# Patient Record
Sex: Male | Born: 1935 | Race: White | Hispanic: No | Smoking: Former smoker
Health system: Southern US, Community
[De-identification: ages and names within clinical notes are randomized; demographics above are authoritative.]

## PROBLEM LIST (undated history)

## (undated) DIAGNOSIS — Z950 Presence of cardiac pacemaker: Secondary | ICD-10-CM

## (undated) DIAGNOSIS — L905 Scar conditions and fibrosis of skin: Secondary | ICD-10-CM

## (undated) DIAGNOSIS — M109 Gout, unspecified: Secondary | ICD-10-CM

## (undated) DIAGNOSIS — I2510 Atherosclerotic heart disease of native coronary artery without angina pectoris: Secondary | ICD-10-CM

## (undated) DIAGNOSIS — I519 Heart disease, unspecified: Secondary | ICD-10-CM

## (undated) DIAGNOSIS — I679 Cerebrovascular disease, unspecified: Secondary | ICD-10-CM

## (undated) DIAGNOSIS — Z7901 Long term (current) use of anticoagulants: Secondary | ICD-10-CM

## (undated) DIAGNOSIS — I10 Essential (primary) hypertension: Secondary | ICD-10-CM

## (undated) DIAGNOSIS — R42 Dizziness and giddiness: Secondary | ICD-10-CM

## (undated) DIAGNOSIS — I5189 Other ill-defined heart diseases: Secondary | ICD-10-CM

## (undated) DIAGNOSIS — Z87891 Personal history of nicotine dependence: Secondary | ICD-10-CM

## (undated) DIAGNOSIS — I495 Sick sinus syndrome: Secondary | ICD-10-CM

## (undated) DIAGNOSIS — E785 Hyperlipidemia, unspecified: Secondary | ICD-10-CM

## (undated) DIAGNOSIS — M1990 Unspecified osteoarthritis, unspecified site: Secondary | ICD-10-CM

## (undated) HISTORY — DX: Scar conditions and fibrosis of skin: L90.5

## (undated) HISTORY — DX: Other ill-defined heart diseases: I51.89

## (undated) HISTORY — DX: Cerebrovascular disease, unspecified: I67.9

## (undated) HISTORY — PX: TRANSURETHRAL RESECTION OF PROSTATE: SHX73

## (undated) HISTORY — DX: Presence of cardiac pacemaker: Z95.0

## (undated) HISTORY — DX: Long term (current) use of anticoagulants: Z79.01

## (undated) HISTORY — DX: Hyperlipidemia, unspecified: E78.5

## (undated) HISTORY — DX: Essential (primary) hypertension: I10

## (undated) HISTORY — PX: KNEE SURGERY: SHX244

## (undated) HISTORY — DX: Gout, unspecified: M10.9

## (undated) HISTORY — DX: Dizziness and giddiness: R42

## (undated) HISTORY — PX: BLADDER SURGERY: SHX569

## (undated) HISTORY — DX: Atherosclerotic heart disease of native coronary artery without angina pectoris: I25.10

## (undated) HISTORY — PX: BACK SURGERY: SHX140

## (undated) HISTORY — DX: Unspecified osteoarthritis, unspecified site: M19.90

## (undated) HISTORY — PX: NECK SURGERY: SHX720

## (undated) HISTORY — DX: Sick sinus syndrome: I49.5

## (undated) HISTORY — DX: Heart disease, unspecified: I51.9

## (undated) HISTORY — PX: CHOLECYSTECTOMY: SHX55

## (undated) HISTORY — DX: Personal history of nicotine dependence: Z87.891

## (undated) HISTORY — PX: APPENDECTOMY: SHX54

---

## 1981-01-12 HISTORY — PX: CORONARY ARTERY BYPASS GRAFT: SHX141

## 2000-08-29 ENCOUNTER — Encounter: Payer: Self-pay | Admitting: Neurosurgery

## 2000-08-29 ENCOUNTER — Ambulatory Visit (HOSPITAL_COMMUNITY): Admission: RE | Admit: 2000-08-29 | Discharge: 2000-08-29 | Payer: Self-pay | Admitting: Neurosurgery

## 2000-09-21 ENCOUNTER — Encounter: Admission: RE | Admit: 2000-09-21 | Discharge: 2000-09-21 | Payer: Self-pay | Admitting: Neurosurgery

## 2000-09-21 ENCOUNTER — Encounter: Payer: Self-pay | Admitting: Neurosurgery

## 2000-10-05 ENCOUNTER — Encounter: Admission: RE | Admit: 2000-10-05 | Discharge: 2000-10-05 | Payer: Self-pay | Admitting: Neurosurgery

## 2000-10-05 ENCOUNTER — Encounter: Payer: Self-pay | Admitting: Neurosurgery

## 2000-11-26 ENCOUNTER — Encounter: Payer: Self-pay | Admitting: Neurosurgery

## 2000-11-30 ENCOUNTER — Encounter: Payer: Self-pay | Admitting: Neurosurgery

## 2000-11-30 ENCOUNTER — Inpatient Hospital Stay (HOSPITAL_COMMUNITY): Admission: RE | Admit: 2000-11-30 | Discharge: 2000-12-02 | Payer: Self-pay | Admitting: Neurosurgery

## 2001-10-15 ENCOUNTER — Ambulatory Visit (HOSPITAL_COMMUNITY): Admission: RE | Admit: 2001-10-15 | Discharge: 2001-10-15 | Payer: Self-pay | Admitting: Neurosurgery

## 2001-10-15 ENCOUNTER — Encounter: Payer: Self-pay | Admitting: Neurosurgery

## 2001-10-25 ENCOUNTER — Encounter: Payer: Self-pay | Admitting: Neurosurgery

## 2001-10-25 ENCOUNTER — Encounter: Admission: RE | Admit: 2001-10-25 | Discharge: 2001-10-25 | Payer: Self-pay | Admitting: Neurosurgery

## 2001-11-16 ENCOUNTER — Encounter: Payer: Self-pay | Admitting: Neurosurgery

## 2001-11-18 ENCOUNTER — Inpatient Hospital Stay (HOSPITAL_COMMUNITY): Admission: RE | Admit: 2001-11-18 | Discharge: 2001-11-20 | Payer: Self-pay | Admitting: Neurosurgery

## 2001-11-18 ENCOUNTER — Encounter: Payer: Self-pay | Admitting: Neurosurgery

## 2002-03-24 ENCOUNTER — Encounter: Payer: Self-pay | Admitting: Emergency Medicine

## 2002-03-24 ENCOUNTER — Emergency Department (HOSPITAL_COMMUNITY): Admission: EM | Admit: 2002-03-24 | Discharge: 2002-03-24 | Payer: Self-pay | Admitting: Emergency Medicine

## 2002-03-25 ENCOUNTER — Inpatient Hospital Stay (HOSPITAL_COMMUNITY): Admission: EM | Admit: 2002-03-25 | Discharge: 2002-03-28 | Payer: Self-pay | Admitting: Emergency Medicine

## 2002-04-10 ENCOUNTER — Other Ambulatory Visit: Admission: RE | Admit: 2002-04-10 | Discharge: 2002-04-10 | Payer: Self-pay | Admitting: Dermatology

## 2002-10-01 ENCOUNTER — Encounter: Payer: Self-pay | Admitting: Emergency Medicine

## 2002-10-01 ENCOUNTER — Inpatient Hospital Stay (HOSPITAL_COMMUNITY): Admission: EM | Admit: 2002-10-01 | Discharge: 2002-10-03 | Payer: Self-pay | Admitting: Cardiology

## 2002-10-01 ENCOUNTER — Emergency Department (HOSPITAL_COMMUNITY): Admission: EM | Admit: 2002-10-01 | Discharge: 2002-10-01 | Payer: Self-pay | Admitting: Emergency Medicine

## 2003-02-07 ENCOUNTER — Ambulatory Visit (HOSPITAL_COMMUNITY): Admission: RE | Admit: 2003-02-07 | Discharge: 2003-02-07 | Payer: Self-pay | Admitting: Cardiology

## 2003-05-17 ENCOUNTER — Other Ambulatory Visit: Admission: RE | Admit: 2003-05-17 | Discharge: 2003-05-17 | Payer: Self-pay | Admitting: Dermatology

## 2003-09-06 ENCOUNTER — Other Ambulatory Visit: Admission: RE | Admit: 2003-09-06 | Discharge: 2003-09-06 | Payer: Self-pay | Admitting: Dermatology

## 2004-01-13 DIAGNOSIS — I679 Cerebrovascular disease, unspecified: Secondary | ICD-10-CM

## 2004-01-13 HISTORY — PX: ANTERIOR FUSION CERVICAL SPINE: SUR626

## 2004-01-13 HISTORY — DX: Cerebrovascular disease, unspecified: I67.9

## 2004-02-13 ENCOUNTER — Ambulatory Visit: Payer: Self-pay | Admitting: Cardiology

## 2004-05-22 ENCOUNTER — Ambulatory Visit (HOSPITAL_COMMUNITY): Admission: RE | Admit: 2004-05-22 | Discharge: 2004-05-22 | Payer: Self-pay | Admitting: Neurosurgery

## 2004-07-03 ENCOUNTER — Ambulatory Visit (HOSPITAL_COMMUNITY): Admission: RE | Admit: 2004-07-03 | Discharge: 2004-07-03 | Payer: Self-pay | Admitting: Neurosurgery

## 2004-07-09 ENCOUNTER — Ambulatory Visit: Payer: Self-pay | Admitting: *Deleted

## 2004-07-10 ENCOUNTER — Ambulatory Visit (HOSPITAL_COMMUNITY): Admission: RE | Admit: 2004-07-10 | Discharge: 2004-07-10 | Payer: Self-pay | Admitting: *Deleted

## 2004-07-10 ENCOUNTER — Ambulatory Visit: Payer: Self-pay | Admitting: Internal Medicine

## 2004-07-11 ENCOUNTER — Ambulatory Visit (HOSPITAL_COMMUNITY): Admission: RE | Admit: 2004-07-11 | Discharge: 2004-07-11 | Payer: Self-pay | Admitting: *Deleted

## 2004-07-11 ENCOUNTER — Ambulatory Visit: Payer: Self-pay | Admitting: Cardiovascular Disease

## 2004-07-21 ENCOUNTER — Ambulatory Visit: Payer: Self-pay | Admitting: Cardiology

## 2004-08-08 ENCOUNTER — Inpatient Hospital Stay (HOSPITAL_COMMUNITY): Admission: RE | Admit: 2004-08-08 | Discharge: 2004-08-09 | Payer: Self-pay | Admitting: Neurosurgery

## 2004-08-12 ENCOUNTER — Ambulatory Visit: Payer: Self-pay | Admitting: Internal Medicine

## 2004-08-12 ENCOUNTER — Ambulatory Visit (HOSPITAL_COMMUNITY): Admission: RE | Admit: 2004-08-12 | Discharge: 2004-08-13 | Payer: Self-pay | Admitting: Internal Medicine

## 2004-08-12 DIAGNOSIS — Z950 Presence of cardiac pacemaker: Secondary | ICD-10-CM

## 2004-08-12 DIAGNOSIS — I495 Sick sinus syndrome: Secondary | ICD-10-CM

## 2004-08-12 HISTORY — DX: Presence of cardiac pacemaker: Z95.0

## 2004-08-12 HISTORY — PX: PACEMAKER PLACEMENT: SHX43

## 2004-08-12 HISTORY — DX: Sick sinus syndrome: I49.5

## 2004-08-25 ENCOUNTER — Ambulatory Visit: Payer: Self-pay

## 2004-09-02 ENCOUNTER — Inpatient Hospital Stay (HOSPITAL_COMMUNITY): Admission: RE | Admit: 2004-09-02 | Discharge: 2004-09-08 | Payer: Self-pay | Admitting: Neurosurgery

## 2004-09-19 ENCOUNTER — Emergency Department (HOSPITAL_COMMUNITY): Admission: EM | Admit: 2004-09-19 | Discharge: 2004-09-19 | Payer: Self-pay | Admitting: Emergency Medicine

## 2004-09-25 ENCOUNTER — Ambulatory Visit: Payer: Self-pay | Admitting: Cardiology

## 2004-10-01 ENCOUNTER — Ambulatory Visit: Payer: Self-pay | Admitting: *Deleted

## 2004-10-06 ENCOUNTER — Ambulatory Visit: Payer: Self-pay | Admitting: Cardiology

## 2004-10-14 ENCOUNTER — Ambulatory Visit: Payer: Self-pay | Admitting: *Deleted

## 2004-10-29 ENCOUNTER — Ambulatory Visit: Payer: Self-pay | Admitting: *Deleted

## 2004-11-19 ENCOUNTER — Ambulatory Visit: Payer: Self-pay | Admitting: *Deleted

## 2004-11-26 ENCOUNTER — Ambulatory Visit: Payer: Self-pay | Admitting: Cardiology

## 2005-02-04 ENCOUNTER — Ambulatory Visit: Payer: Self-pay | Admitting: Internal Medicine

## 2005-02-20 ENCOUNTER — Ambulatory Visit: Payer: Self-pay | Admitting: Cardiology

## 2005-03-13 ENCOUNTER — Ambulatory Visit: Payer: Self-pay | Admitting: *Deleted

## 2005-04-23 ENCOUNTER — Ambulatory Visit: Payer: Self-pay | Admitting: Cardiology

## 2005-05-11 ENCOUNTER — Ambulatory Visit: Payer: Self-pay | Admitting: Cardiology

## 2005-06-12 ENCOUNTER — Ambulatory Visit: Payer: Self-pay | Admitting: *Deleted

## 2005-08-24 ENCOUNTER — Emergency Department (HOSPITAL_COMMUNITY): Admission: EM | Admit: 2005-08-24 | Discharge: 2005-08-24 | Payer: Self-pay | Admitting: Emergency Medicine

## 2005-09-01 ENCOUNTER — Ambulatory Visit: Payer: Self-pay | Admitting: Internal Medicine

## 2005-10-16 ENCOUNTER — Inpatient Hospital Stay (HOSPITAL_COMMUNITY): Admission: AD | Admit: 2005-10-16 | Discharge: 2005-10-21 | Payer: Self-pay | Admitting: Family Medicine

## 2005-11-18 ENCOUNTER — Ambulatory Visit (HOSPITAL_COMMUNITY): Admission: RE | Admit: 2005-11-18 | Discharge: 2005-11-18 | Payer: Self-pay | Admitting: Neurosurgery

## 2005-12-10 ENCOUNTER — Ambulatory Visit: Payer: Self-pay | Admitting: Cardiology

## 2005-12-17 ENCOUNTER — Ambulatory Visit: Payer: Self-pay | Admitting: Cardiology

## 2005-12-22 ENCOUNTER — Inpatient Hospital Stay (HOSPITAL_COMMUNITY): Admission: RE | Admit: 2005-12-22 | Discharge: 2005-12-26 | Payer: Self-pay | Admitting: Neurosurgery

## 2006-01-18 ENCOUNTER — Ambulatory Visit: Payer: Self-pay | Admitting: Cardiology

## 2006-01-25 ENCOUNTER — Ambulatory Visit: Payer: Self-pay | Admitting: Internal Medicine

## 2006-02-09 ENCOUNTER — Ambulatory Visit: Payer: Self-pay | Admitting: Cardiology

## 2006-02-23 ENCOUNTER — Ambulatory Visit: Payer: Self-pay | Admitting: Internal Medicine

## 2006-03-02 ENCOUNTER — Ambulatory Visit: Payer: Self-pay | Admitting: Cardiology

## 2006-03-30 ENCOUNTER — Ambulatory Visit: Payer: Self-pay | Admitting: Cardiology

## 2006-04-16 ENCOUNTER — Ambulatory Visit: Payer: Self-pay | Admitting: Cardiology

## 2006-04-30 ENCOUNTER — Ambulatory Visit: Payer: Self-pay | Admitting: Cardiology

## 2006-05-13 ENCOUNTER — Ambulatory Visit: Payer: Self-pay | Admitting: Cardiology

## 2006-06-14 ENCOUNTER — Ambulatory Visit: Payer: Self-pay | Admitting: Cardiology

## 2006-06-29 ENCOUNTER — Emergency Department (HOSPITAL_COMMUNITY): Admission: EM | Admit: 2006-06-29 | Discharge: 2006-06-29 | Payer: Self-pay | Admitting: Emergency Medicine

## 2006-07-19 ENCOUNTER — Ambulatory Visit: Payer: Self-pay | Admitting: Internal Medicine

## 2006-08-02 ENCOUNTER — Ambulatory Visit: Payer: Self-pay | Admitting: Cardiology

## 2006-08-19 ENCOUNTER — Ambulatory Visit: Payer: Self-pay | Admitting: Internal Medicine

## 2006-08-30 ENCOUNTER — Ambulatory Visit: Payer: Self-pay | Admitting: Cardiology

## 2006-10-01 ENCOUNTER — Ambulatory Visit: Payer: Self-pay | Admitting: Cardiology

## 2006-11-01 ENCOUNTER — Ambulatory Visit: Payer: Self-pay | Admitting: Cardiology

## 2006-11-15 ENCOUNTER — Ambulatory Visit: Payer: Self-pay | Admitting: Cardiology

## 2006-12-06 ENCOUNTER — Ambulatory Visit: Payer: Self-pay | Admitting: Internal Medicine

## 2006-12-29 ENCOUNTER — Ambulatory Visit: Payer: Self-pay | Admitting: Cardiology

## 2007-01-12 ENCOUNTER — Ambulatory Visit: Payer: Self-pay | Admitting: Cardiology

## 2007-02-09 ENCOUNTER — Ambulatory Visit: Payer: Self-pay | Admitting: Cardiology

## 2007-03-24 ENCOUNTER — Ambulatory Visit: Payer: Self-pay | Admitting: Cardiology

## 2007-04-14 ENCOUNTER — Ambulatory Visit: Payer: Self-pay | Admitting: Cardiovascular Disease

## 2007-04-21 ENCOUNTER — Ambulatory Visit: Payer: Self-pay | Admitting: Cardiology

## 2007-05-19 ENCOUNTER — Ambulatory Visit: Payer: Self-pay | Admitting: Cardiology

## 2007-06-07 ENCOUNTER — Ambulatory Visit: Payer: Self-pay | Admitting: Internal Medicine

## 2007-06-16 ENCOUNTER — Ambulatory Visit: Payer: Self-pay | Admitting: Cardiology

## 2007-06-20 ENCOUNTER — Emergency Department (HOSPITAL_COMMUNITY): Admission: EM | Admit: 2007-06-20 | Discharge: 2007-06-20 | Payer: Self-pay | Admitting: Emergency Medicine

## 2007-07-14 ENCOUNTER — Ambulatory Visit: Payer: Self-pay | Admitting: Cardiology

## 2007-07-28 ENCOUNTER — Ambulatory Visit: Payer: Self-pay | Admitting: Cardiology

## 2007-08-08 ENCOUNTER — Ambulatory Visit (HOSPITAL_COMMUNITY): Admission: RE | Admit: 2007-08-08 | Discharge: 2007-08-08 | Payer: Self-pay | Admitting: Internal Medicine

## 2007-08-19 ENCOUNTER — Ambulatory Visit: Payer: Self-pay | Admitting: Cardiology

## 2007-09-02 ENCOUNTER — Ambulatory Visit: Payer: Self-pay | Admitting: Internal Medicine

## 2007-09-15 ENCOUNTER — Ambulatory Visit: Payer: Self-pay | Admitting: Cardiology

## 2007-09-29 ENCOUNTER — Ambulatory Visit: Payer: Self-pay | Admitting: Cardiology

## 2007-10-13 ENCOUNTER — Ambulatory Visit (HOSPITAL_COMMUNITY): Admission: RE | Admit: 2007-10-13 | Discharge: 2007-10-13 | Payer: Self-pay | Admitting: Internal Medicine

## 2007-10-21 ENCOUNTER — Ambulatory Visit (HOSPITAL_COMMUNITY): Admission: RE | Admit: 2007-10-21 | Discharge: 2007-10-21 | Payer: Self-pay | Admitting: Urology

## 2007-10-27 ENCOUNTER — Ambulatory Visit: Payer: Self-pay | Admitting: Cardiology

## 2007-11-08 ENCOUNTER — Ambulatory Visit (HOSPITAL_COMMUNITY): Admission: RE | Admit: 2007-11-08 | Discharge: 2007-11-08 | Payer: Self-pay | Admitting: General Surgery

## 2008-02-20 ENCOUNTER — Ambulatory Visit: Payer: Self-pay | Admitting: Cardiology

## 2008-02-23 ENCOUNTER — Encounter: Payer: Self-pay | Admitting: Internal Medicine

## 2008-02-23 ENCOUNTER — Ambulatory Visit: Payer: Self-pay | Admitting: Cardiology

## 2008-03-26 ENCOUNTER — Ambulatory Visit: Payer: Self-pay | Admitting: Cardiology

## 2008-04-09 ENCOUNTER — Ambulatory Visit: Payer: Self-pay | Admitting: Cardiology

## 2008-04-23 ENCOUNTER — Ambulatory Visit: Payer: Self-pay | Admitting: Cardiology

## 2008-05-10 ENCOUNTER — Ambulatory Visit: Payer: Self-pay | Admitting: Cardiology

## 2008-05-24 ENCOUNTER — Ambulatory Visit: Payer: Self-pay | Admitting: Internal Medicine

## 2008-05-28 ENCOUNTER — Encounter: Payer: Self-pay | Admitting: Cardiology

## 2008-05-28 ENCOUNTER — Ambulatory Visit (HOSPITAL_COMMUNITY): Admission: RE | Admit: 2008-05-28 | Discharge: 2008-05-28 | Payer: Self-pay | Admitting: Cardiology

## 2008-05-28 ENCOUNTER — Encounter: Payer: Self-pay | Admitting: Physician Assistant

## 2008-05-28 ENCOUNTER — Ambulatory Visit: Payer: Self-pay | Admitting: Cardiology

## 2008-05-28 ENCOUNTER — Encounter (INDEPENDENT_AMBULATORY_CARE_PROVIDER_SITE_OTHER): Payer: Self-pay | Admitting: *Deleted

## 2008-05-28 LAB — CONVERTED CEMR LAB
ALT: 13 units/L
AST: 25 units/L
AST: 25 units/L
Alkaline Phosphatase: 55 units/L
Alkaline Phosphatase: 55 units/L
BUN: 14 mg/dL
BUN: 14 mg/dL
Calcium: 9.4 mg/dL
Creatinine, Ser: 0.87 mg/dL
Creatinine, Ser: 0.87 mg/dL
Glucose, Bld: 92 mg/dL
HCT: 42.1 %
Hemoglobin: 14.6 g/dL
MCV: 96.9 fL
WBC: 6.1 10*3/uL

## 2008-05-29 ENCOUNTER — Ambulatory Visit (HOSPITAL_COMMUNITY): Admission: RE | Admit: 2008-05-29 | Discharge: 2008-05-29 | Payer: Self-pay | Admitting: Cardiology

## 2008-06-01 ENCOUNTER — Ambulatory Visit: Payer: Self-pay | Admitting: Cardiology

## 2008-06-01 ENCOUNTER — Encounter (HOSPITAL_COMMUNITY): Admission: RE | Admit: 2008-06-01 | Discharge: 2008-07-01 | Payer: Self-pay | Admitting: Cardiology

## 2008-06-05 DIAGNOSIS — M109 Gout, unspecified: Secondary | ICD-10-CM

## 2008-06-05 DIAGNOSIS — R42 Dizziness and giddiness: Secondary | ICD-10-CM

## 2008-06-05 DIAGNOSIS — I10 Essential (primary) hypertension: Secondary | ICD-10-CM | POA: Insufficient documentation

## 2008-06-05 DIAGNOSIS — E785 Hyperlipidemia, unspecified: Secondary | ICD-10-CM

## 2008-06-05 DIAGNOSIS — I2510 Atherosclerotic heart disease of native coronary artery without angina pectoris: Secondary | ICD-10-CM | POA: Insufficient documentation

## 2008-06-07 ENCOUNTER — Ambulatory Visit: Payer: Self-pay | Admitting: Physician Assistant

## 2008-06-07 ENCOUNTER — Encounter: Payer: Self-pay | Admitting: Physician Assistant

## 2008-06-14 ENCOUNTER — Ambulatory Visit: Payer: Self-pay | Admitting: Cardiology

## 2008-06-21 ENCOUNTER — Encounter (INDEPENDENT_AMBULATORY_CARE_PROVIDER_SITE_OTHER): Payer: Self-pay | Admitting: *Deleted

## 2008-07-02 ENCOUNTER — Telehealth: Payer: Self-pay | Admitting: Cardiology

## 2008-07-09 ENCOUNTER — Ambulatory Visit: Payer: Self-pay | Admitting: Cardiology

## 2008-08-06 ENCOUNTER — Ambulatory Visit: Payer: Self-pay | Admitting: Cardiology

## 2008-08-13 ENCOUNTER — Ambulatory Visit: Payer: Self-pay | Admitting: Cardiology

## 2008-08-23 ENCOUNTER — Ambulatory Visit: Payer: Self-pay | Admitting: Internal Medicine

## 2008-08-27 ENCOUNTER — Encounter: Payer: Self-pay | Admitting: *Deleted

## 2008-08-30 ENCOUNTER — Ambulatory Visit: Payer: Self-pay | Admitting: Cardiology

## 2008-09-05 ENCOUNTER — Ambulatory Visit: Payer: Self-pay | Admitting: Internal Medicine

## 2008-09-24 ENCOUNTER — Ambulatory Visit: Payer: Self-pay

## 2008-10-18 ENCOUNTER — Ambulatory Visit: Payer: Self-pay | Admitting: Cardiology

## 2008-10-18 LAB — CONVERTED CEMR LAB: POC INR: 3.6

## 2008-11-21 ENCOUNTER — Ambulatory Visit: Payer: Self-pay | Admitting: Cardiology

## 2008-11-21 ENCOUNTER — Encounter (INDEPENDENT_AMBULATORY_CARE_PROVIDER_SITE_OTHER): Payer: Self-pay | Admitting: *Deleted

## 2008-11-21 DIAGNOSIS — I495 Sick sinus syndrome: Secondary | ICD-10-CM | POA: Insufficient documentation

## 2008-11-21 DIAGNOSIS — F17200 Nicotine dependence, unspecified, uncomplicated: Secondary | ICD-10-CM

## 2008-11-21 DIAGNOSIS — I679 Cerebrovascular disease, unspecified: Secondary | ICD-10-CM | POA: Insufficient documentation

## 2008-11-23 ENCOUNTER — Encounter (INDEPENDENT_AMBULATORY_CARE_PROVIDER_SITE_OTHER): Payer: Self-pay | Admitting: *Deleted

## 2008-11-23 LAB — CONVERTED CEMR LAB
OCCULT 1: NEGATIVE
OCCULT 2: NEGATIVE
OCCULT 3: NEGATIVE

## 2008-11-30 ENCOUNTER — Inpatient Hospital Stay (HOSPITAL_COMMUNITY): Admission: RE | Admit: 2008-11-30 | Discharge: 2008-12-02 | Payer: Self-pay | Admitting: Neurosurgery

## 2008-12-17 ENCOUNTER — Ambulatory Visit: Payer: Self-pay | Admitting: Cardiovascular Disease

## 2009-01-12 LAB — HM COLONOSCOPY: HM Colonoscopy: NORMAL

## 2009-02-13 ENCOUNTER — Encounter (INDEPENDENT_AMBULATORY_CARE_PROVIDER_SITE_OTHER): Payer: Self-pay | Admitting: Cardiology

## 2009-02-15 ENCOUNTER — Encounter (INDEPENDENT_AMBULATORY_CARE_PROVIDER_SITE_OTHER): Payer: Self-pay | Admitting: *Deleted

## 2009-02-25 ENCOUNTER — Ambulatory Visit: Payer: Self-pay | Admitting: Cardiology

## 2009-02-25 ENCOUNTER — Encounter (INDEPENDENT_AMBULATORY_CARE_PROVIDER_SITE_OTHER): Payer: Self-pay | Admitting: Cardiology

## 2009-03-11 ENCOUNTER — Ambulatory Visit: Payer: Self-pay | Admitting: Cardiology

## 2009-03-11 ENCOUNTER — Ambulatory Visit: Payer: Self-pay | Admitting: Internal Medicine

## 2009-03-11 DIAGNOSIS — Z950 Presence of cardiac pacemaker: Secondary | ICD-10-CM | POA: Insufficient documentation

## 2009-03-11 DIAGNOSIS — I4891 Unspecified atrial fibrillation: Secondary | ICD-10-CM | POA: Insufficient documentation

## 2009-03-11 LAB — CONVERTED CEMR LAB: POC INR: 2.2

## 2009-04-01 ENCOUNTER — Encounter (INDEPENDENT_AMBULATORY_CARE_PROVIDER_SITE_OTHER): Payer: Self-pay | Admitting: Cardiology

## 2009-04-01 ENCOUNTER — Ambulatory Visit: Payer: Self-pay | Admitting: Internal Medicine

## 2009-04-01 LAB — CONVERTED CEMR LAB: POC INR: 2.3

## 2009-04-29 ENCOUNTER — Ambulatory Visit: Payer: Self-pay | Admitting: Cardiology

## 2009-04-29 LAB — CONVERTED CEMR LAB: POC INR: 1.9

## 2009-05-20 ENCOUNTER — Ambulatory Visit: Payer: Self-pay | Admitting: Internal Medicine

## 2009-05-20 LAB — CONVERTED CEMR LAB: POC INR: 1.9

## 2009-06-07 ENCOUNTER — Ambulatory Visit: Payer: Self-pay | Admitting: Cardiovascular Disease

## 2009-06-07 LAB — CONVERTED CEMR LAB: POC INR: 2.3

## 2009-07-05 ENCOUNTER — Ambulatory Visit: Payer: Self-pay | Admitting: Cardiology

## 2009-07-11 ENCOUNTER — Telehealth (INDEPENDENT_AMBULATORY_CARE_PROVIDER_SITE_OTHER): Payer: Self-pay

## 2009-08-02 ENCOUNTER — Ambulatory Visit: Payer: Self-pay | Admitting: Cardiology

## 2009-09-02 ENCOUNTER — Ambulatory Visit: Payer: Self-pay | Admitting: Internal Medicine

## 2009-09-03 ENCOUNTER — Ambulatory Visit: Payer: Self-pay | Admitting: Internal Medicine

## 2009-09-30 ENCOUNTER — Ambulatory Visit: Payer: Self-pay | Admitting: Cardiovascular Disease

## 2009-09-30 LAB — CONVERTED CEMR LAB: POC INR: 1.9

## 2009-10-02 ENCOUNTER — Telehealth: Payer: Self-pay | Admitting: Internal Medicine

## 2009-10-28 ENCOUNTER — Ambulatory Visit: Payer: Self-pay | Admitting: Cardiology

## 2009-11-25 ENCOUNTER — Ambulatory Visit: Payer: Self-pay | Admitting: Cardiology

## 2009-11-25 DIAGNOSIS — I2510 Atherosclerotic heart disease of native coronary artery without angina pectoris: Secondary | ICD-10-CM | POA: Insufficient documentation

## 2009-12-10 ENCOUNTER — Ambulatory Visit: Payer: Self-pay | Admitting: Internal Medicine

## 2009-12-10 DIAGNOSIS — N138 Other obstructive and reflux uropathy: Secondary | ICD-10-CM

## 2009-12-10 DIAGNOSIS — M1990 Unspecified osteoarthritis, unspecified site: Secondary | ICD-10-CM | POA: Insufficient documentation

## 2009-12-10 DIAGNOSIS — N403 Nodular prostate with lower urinary tract symptoms: Secondary | ICD-10-CM

## 2009-12-10 LAB — CONVERTED CEMR LAB
ALT: 20 units/L (ref 0–53)
Albumin: 4.6 g/dL (ref 3.5–5.2)
BUN: 26 mg/dL — ABNORMAL HIGH (ref 6–23)
Basophils Relative: 0.3 % (ref 0.0–3.0)
Bilirubin Urine: NEGATIVE
CO2: 29 meq/L (ref 19–32)
Chloride: 99 meq/L (ref 96–112)
Cholesterol, target level: 200 mg/dL
Cholesterol: 200 mg/dL (ref 0–200)
Eosinophils Relative: 1.2 % (ref 0.0–5.0)
HCT: 44.2 % (ref 39.0–52.0)
Ketones, ur: NEGATIVE mg/dL
LDL Cholesterol: 113 mg/dL — ABNORMAL HIGH (ref 0–99)
LDL Goal: 100 mg/dL
Leukocytes, UA: NEGATIVE
Lymphs Abs: 1.2 10*3/uL (ref 0.7–4.0)
MCHC: 34.7 g/dL (ref 30.0–36.0)
MCV: 94.1 fL (ref 78.0–100.0)
Monocytes Absolute: 0.7 10*3/uL (ref 0.1–1.0)
PSA: 1.07 ng/mL (ref 0.10–4.00)
Platelets: 218 10*3/uL (ref 150.0–400.0)
Potassium: 3.5 meq/L (ref 3.5–5.1)
RBC: 4.69 M/uL (ref 4.22–5.81)
TSH: 1.53 microintl units/mL (ref 0.35–5.50)
Total Protein: 7.6 g/dL (ref 6.0–8.3)
Uric Acid, Serum: 9.8 mg/dL — ABNORMAL HIGH (ref 4.0–7.8)
Urobilinogen, UA: 0.2 (ref 0.0–1.0)
WBC: 6.3 10*3/uL (ref 4.5–10.5)

## 2009-12-12 ENCOUNTER — Telehealth (INDEPENDENT_AMBULATORY_CARE_PROVIDER_SITE_OTHER): Payer: Self-pay | Admitting: *Deleted

## 2009-12-17 ENCOUNTER — Ambulatory Visit: Payer: Self-pay

## 2009-12-17 ENCOUNTER — Encounter: Payer: Self-pay | Admitting: Cardiology

## 2009-12-23 ENCOUNTER — Ambulatory Visit: Payer: Self-pay | Admitting: Cardiology

## 2009-12-23 LAB — CONVERTED CEMR LAB: POC INR: 4

## 2009-12-25 ENCOUNTER — Telehealth: Payer: Self-pay | Admitting: Cardiology

## 2009-12-26 ENCOUNTER — Ambulatory Visit: Payer: Self-pay | Admitting: Internal Medicine

## 2009-12-26 DIAGNOSIS — M5030 Other cervical disc degeneration, unspecified cervical region: Secondary | ICD-10-CM

## 2009-12-26 DIAGNOSIS — N410 Acute prostatitis: Secondary | ICD-10-CM

## 2010-01-08 ENCOUNTER — Telehealth: Payer: Self-pay | Admitting: Cardiology

## 2010-01-08 LAB — CONVERTED CEMR LAB
INR: 10.4
INR: 10.4 (ref 0.8–1.0)
Prothrombin Time: 111.6 s
Prothrombin Time: 111.6 s (ref 9.7–11.8)

## 2010-01-09 ENCOUNTER — Telehealth: Payer: Self-pay | Admitting: Cardiology

## 2010-01-09 ENCOUNTER — Ambulatory Visit: Payer: Self-pay | Admitting: Cardiology

## 2010-01-14 ENCOUNTER — Ambulatory Visit: Admission: RE | Admit: 2010-01-14 | Discharge: 2010-01-14 | Payer: Self-pay | Source: Home / Self Care

## 2010-01-22 ENCOUNTER — Ambulatory Visit: Admission: RE | Admit: 2010-01-22 | Discharge: 2010-01-22 | Payer: Self-pay | Source: Home / Self Care

## 2010-01-24 ENCOUNTER — Ambulatory Visit
Admission: RE | Admit: 2010-01-24 | Discharge: 2010-01-24 | Payer: Self-pay | Source: Home / Self Care | Attending: Internal Medicine | Admitting: Internal Medicine

## 2010-01-30 ENCOUNTER — Ambulatory Visit: Admission: RE | Admit: 2010-01-30 | Discharge: 2010-01-30 | Payer: Self-pay | Source: Home / Self Care

## 2010-02-03 ENCOUNTER — Encounter: Payer: Self-pay | Admitting: Internal Medicine

## 2010-02-11 NOTE — Medication Information (Signed)
Summary: rov/eac  Anticoagulant Therapy  Managed by: Elaina Pattee, PharmD PCP: Dr. Nobie Putnam Supervising MD: Shirlee Latch MD, Lauraine Crespo Indication 1: Atrial Fibrillation (ICD-427.31) Lab Used: [facility] [place] Site: [address] INR POC 2.6 INR RANGE 2 - 3   Dietary changes: no    Health status changes: no    Bleeding/hemorrhagic complications: no    Recent/future hospitalizations: no    Any changes in medication regimen? no    Recent/future dental: no  Any missed doses?: no       Is patient compliant with meds? yes       Allergies: No Known Drug Allergies  Anticoagulation Management History:      The patient is taking warfarin and comes in today for a routine follow up visit.  Positive risk factors for bleeding include an age of 75 years or older.  The bleeding index is 'intermediate risk'.  Positive CHADS2 values include History of HTN.  Negative CHADS2 values include Age > 57 years old.  The start date was 09/25/2004.  Anticoagulation responsible provider: Shirlee Latch MD, Kalaysia Demonbreun.  INR POC: 2.6.  Cuvette Lot#: [id number].  Exp: 08/2010.    Anticoagulation Management Assessment/Plan:      The patient's current anticoagulation dose is Coumadin 5 mg tabs: TAKE 1 TAB DAILY OR AS DIRECTED BY COUMADIN CLINIC.  The target INR is 2 - 3.  The next INR is due 08/02/2009.  Anticoagulation instructions were given to patient.  Results were reviewed/authorized by Elaina Pattee, PharmD.  He was notified by Elaina Pattee, PharmD.         Prior Anticoagulation Instructions: INR 2.3  Continue taking 1.5 tablets on Monday, Wednesday, and Friday and 1 tablet all other days.  Return to clinic in 4 weeks.  Current Anticoagulation Instructions: INR 2.6. Take 1 tablet daily except 1.5 tablets Mon, Wed, Fri. Recheck in 4 weeks.

## 2010-02-11 NOTE — Medication Information (Signed)
Summary: rov/tm  Anticoagulant Therapy  Managed by: [name], PharmD PCP: Dr. Nobie Putnam Supervising MD: Shirlee Latch MD, Freida Busman Indication 1: Atrial Fibrillation (ICD-427.31) Lab Used: [facility] [place] Site: [address] INR POC 3.0 INR RANGE 2 - 3   Dietary changes: no    Health status changes: no    Bleeding/hemorrhagic complications: no    Recent/future hospitalizations: no    Any changes in medication regimen? no    Recent/future dental: no  Any missed doses?: no       Is patient compliant with meds? yes       Allergies: No Known Drug Allergies  Anticoagulation Management History:      The patient is taking warfarin and comes in today for a routine follow up visit.  Positive risk factors for bleeding include an age of 41 years or older.  The bleeding index is 'intermediate risk'.  Positive CHADS2 values include History of HTN and Age > 36 years old.  The start date was 09/25/2004.  Anticoagulation responsible provider: Shirlee Latch MD, Sulema Braid.  INR POC: 3.0.  Cuvette Lot#: [id number].  Exp: 11/2010.    Anticoagulation Management Assessment/Plan:      The patient's current anticoagulation dose is Coumadin 5 mg tabs: TAKE 1 TAB DAILY OR AS DIRECTED BY COUMADIN CLINIC.  The target INR is 2 - 3.  The next INR is due 12/23/2009.  Anticoagulation instructions were given to patient.  Results were reviewed/authorized by [name], PharmD.  He was notified by Hoy Register, PharmD Candidate.         Prior Anticoagulation Instructions: inr 2.6 Continue 5mg s daily except 7.5mg s on M,W&F. Recheck in 4 weeks.   Current Anticoagulation Instructions: INR 3.0 Continue previous dose of 1 tablet everyday except 1.5 tablets on Monday, Wednesday, and Friday. Recheck INR in 4 weeks

## 2010-02-11 NOTE — Medication Information (Signed)
Summary: [name]  Anticoagulant Therapy  Managed by: Bethena Midget, RN, BSN PCP: Dr. Nobie Putnam Supervising MD: Jens Som MD, Arlys John Indication 1: Atrial Fibrillation (ICD-427.31) Lab Used: [facility] [place] Site: [address] INR POC 2.1 INR RANGE 2 - 3   Dietary changes: yes       Details: has been eating less green vegetables   Health status changes: no    Bleeding/hemorrhagic complications: no    Recent/future hospitalizations: no    Any changes in medication regimen? no    Recent/future dental: no  Any missed doses?: no       Is patient compliant with meds? yes       Allergies: No Known Drug Allergies  Anticoagulation Management History:      The patient is taking warfarin and comes in today for a routine follow up visit.  Positive risk factors for bleeding include an age of 58 years or older.  The bleeding index is 'intermediate risk'.  Positive CHADS2 values include History of HTN.  Negative CHADS2 values include Age > 34 years old.  The start date was 09/25/2004.  Anticoagulation responsible provider: Jens Som MD, Arlys John.  INR POC: 2.1.  Cuvette Lot#: [id number].  Exp: 10/2010.    Anticoagulation Management Assessment/Plan:      The patient's current anticoagulation dose is Coumadin 5 mg tabs: TAKE 1 TAB DAILY OR AS DIRECTED BY COUMADIN CLINIC.  The target INR is 2 - 3.  The next INR is due 08/30/2009.  Anticoagulation instructions were given to patient.  Results were reviewed/authorized by Bethena Midget, RN, BSN.  He was notified by Dillard Cannon.         Prior Anticoagulation Instructions: INR 2.6. Take 1 tablet daily except 1.5 tablets Mon, Wed, Fri. Recheck in 4 weeks.  Current Anticoagulation Instructions: INR 2.1  Continue same dose of 1 tab daily except for 1.5 tabs on Monday, Wednesday, and Friday.  Re-check in 4 weeks.

## 2010-02-11 NOTE — Medication Information (Signed)
Summary: ROV/LB  Anticoagulant Therapy  Managed by: Shelby Dubin, PharmD, BCPS, CPP PCP: Dr. Nobie Putnam Supervising MD: Tenny Craw MD, Gunnar Fusi Indication 1: Atrial Fibrillation (ICD-427.31) Lab Used: [facility] [place] Site: [place] INR POC 2.3 INR RANGE 2 - 3   Dietary changes: no    Health status changes: no    Bleeding/hemorrhagic complications: no    Recent/future hospitalizations: no    Any changes in medication regimen? no    Recent/future dental: no  Any missed doses?: no       Is patient compliant with meds? yes       Allergies (verified): No Known Drug Allergies  Anticoagulation Management History:      The patient is taking warfarin and comes in today for a routine follow up visit.  Positive risk factors for bleeding include an age of 75 years or older.  The bleeding index is 'intermediate risk'.  Positive CHADS2 values include History of HTN.  Negative CHADS2 values include Age > 32 years old.  The start date was 09/25/2004.  Anticoagulation responsible provider: Tenny Craw MD, Gunnar Fusi.  INR POC: 2.3.  Cuvette Lot#: [id number].  Exp: 05/2010.    Anticoagulation Management Assessment/Plan:      The patient's current anticoagulation dose is Coumadin 5 mg tabs: TAKE 1 TAB DAILY OR AS DIRECTED BY COUMADIN CLINIC.  The target INR is 2 - 3.  The next INR is due 04/29/2009.  Anticoagulation instructions were given to patient.  Results were reviewed/authorized by Shelby Dubin, PharmD, BCPS, CPP.  He was notified by Shelby Dubin PharmD, BCPS, CPP.         Prior Anticoagulation Instructions: INR 2.2  CONTINUE TAKING 1 TABLET EVERYDAY EXCEPT TAKE 1.5 TABLETS ON MONDAYS AND FRIDAYS.  RECHECK IN 3 WEEKS.  Current Anticoagulation Instructions: INR 2.3  Continue 1.5 tabs each Monday and Friday, and 1 tab other days.  Recheck in 4 weeks.

## 2010-02-11 NOTE — Medication Information (Signed)
Summary: rov/tm  Anticoagulant Therapy  Managed by: Eda Keys, PharmD PCP: Dr. Nobie Putnam Supervising MD: Eden Emms MD, Theron Arista Indication 1: Atrial Fibrillation (ICD-427.31) Lab Used: [facility] [place] Site: [address] INR POC 2.3 INR RANGE 2 - 3   Dietary changes: no    Health status changes: no    Bleeding/hemorrhagic complications: no    Recent/future hospitalizations: no    Any changes in medication regimen? no    Recent/future dental: no  Any missed doses?: no       Is patient compliant with meds? yes       Allergies: No Known Drug Allergies  Anticoagulation Management History:      The patient is taking warfarin and comes in today for a routine follow up visit.  Positive risk factors for bleeding include an age of 75 years or older.  The bleeding index is 'intermediate risk'.  Positive CHADS2 values include History of HTN.  Negative CHADS2 values include Age > 25 years old.  The start date was 09/25/2004.  Anticoagulation responsible provider: Eden Emms MD, Theron Arista.  INR POC: 2.3.  Cuvette Lot#: [id number].  Exp: 08/2010.    Anticoagulation Management Assessment/Plan:      The patient's current anticoagulation dose is Coumadin 5 mg tabs: TAKE 1 TAB DAILY OR AS DIRECTED BY COUMADIN CLINIC.  The target INR is 2 - 3.  The next INR is due 07/05/2009.  Anticoagulation instructions were given to patient.  Results were reviewed/authorized by Eda Keys, PharmD.  He was notified by Eda Keys.         Prior Anticoagulation Instructions: INR 1.9 Today take 2 pills then change dose to 1 pill everyday except 1 1/2 pills on Mondays, Wednesdays and Fridays. Recheck in 2-3 weeks.   Current Anticoagulation Instructions: INR 2.3  Continue taking 1.5 tablets on Monday, Wednesday, and Friday and 1 tablet all other days.  Return to clinic in 4 weeks.

## 2010-02-11 NOTE — Cardiovascular Report (Signed)
Summary: Office Visit   Office Visit   Imported By: Roderic Ovens 09/06/2009 10:25:26  _____________________________________________________________________  External Attachment:    Type:   Image     Comment:   External Document

## 2010-02-11 NOTE — Medication Information (Signed)
Summary: rov.mp  Anticoagulant Therapy  Managed by: Jeralene Peters, PharmD PCP: Dr. Nobie Putnam Supervising MD: Shirlee Latch MD, Freida Busman Indication 1: Atrial Fibrillation (ICD-427.31) Lab Used: [facility]  Site: [place] INR POC 2.2 INR RANGE 2 - 3   Dietary changes: no    Health status changes: no    Bleeding/hemorrhagic complications: no    Recent/future hospitalizations: no    Any changes in medication regimen? no    Recent/future dental: no  Any missed doses?: no       Is patient compliant with meds? yes       Current Medications (verified): 1)  Nitroglycerin 0.4 Mg Subl (Nitroglycerin) .... One Tablet Under Tongue Every 5 Minutes As Needed For Chest Pain---May Repeat Times Three 2)  Simvastatin 40 Mg Tabs (Simvastatin) .... Take One Tablet By Mouth Daily At Bedtime 3)  Hydrochlorothiazide 25 Mg Tabs (Hydrochlorothiazide) .... Take One Tablet By Mouth Daily. 4)  Lisinopril 20 Mg Tabs (Lisinopril) .... Take One Tablet By Mouth Daily 5)  Klor-Con M20 20 Meq Cr-Tabs (Potassium Chloride Crys Cr) .... Take 1 Tablet By Mouth Once A Day 6)  Coumadin 5 Mg Tabs (Warfarin Sodium) .... Take 1 Tab Daily or As Directed By Coumadin Clinic 7)  Viagra 50 Mg Tabs (Sildenafil Citrate) .... Take 1 Tablet By Mouth As Directed 8)  Amlodipine Besylate 5 Mg Tabs (Amlodipine Besylate) .... Take 1 Tab Daily  Allergies (verified): No Known Drug Allergies  Anticoagulation Management History:      The patient is taking warfarin and comes in today for a routine follow up visit.  Positive risk factors for bleeding include an age of 75 years or older.  The bleeding index is 'intermediate risk'.  Positive CHADS2 values include History of HTN.  Negative CHADS2 values include Age > 75 years old.  The start date was 09/25/2004.  Anticoagulation responsible provider: Shirlee Latch MD, Aliyana Dlugosz.  INR POC: 2.2.  Cuvette Lot#: [id number].  Exp: 05/2010.    Anticoagulation Management  Assessment/Plan:      The patient's current anticoagulation dose is Coumadin 5 mg tabs: TAKE 1 TAB DAILY OR AS DIRECTED BY COUMADIN CLINIC.  The target INR is 2 - 3.  The next INR is due 04/01/2009.  Anticoagulation instructions were given to patient.  Results were reviewed/authorized by Jeralene Peters, PharmD.         Prior Anticoagulation Instructions: INR 1.6  Take 2 tabs today, then take 1.5 tabs each Monday and Friday and 1 tab on all other days.    Recheck in 2 weeks.     Current Anticoagulation Instructions: INR 2.2  CONTINUE TAKING 1 TABLET EVERYDAY EXCEPT TAKE 1.5 TABLETS ON MONDAYS AND FRIDAYS.  RECHECK IN 3 WEEKS.

## 2010-02-11 NOTE — Assessment & Plan Note (Signed)
Summary: 1 YR F/U PER CHECKOUT ON 11/21/08/TG   Visit Type:  Follow-up Primary Provider:  Dr. Nobie Putnam  CC:  CAD.  History of Present Illness: The patient presents to establish with a new cardiologist as he has moved to [place]. He was seeing Dr. Dietrich Pates.  He has a history of CABG. His last stress test was 2010 there was no evidence of ischemia with a preserved ejection fraction with old inferior scar. He has done well since he was last seen. He doesn't do as much walking because of some back pain. He can get on a treadmill. With this level of activity he does not have chest pressure, neck or arm discomfort. He does not have shortness of breath, PND or orthopnea. He does not have palpitations, presyncope or syncope.  Current Medications (verified): 1)  Nitroglycerin 0.4 Mg Subl (Nitroglycerin) .... One Tablet Under Tongue Every 5 Minutes As Needed For Chest Pain---May Repeat Times Three 2)  Simvastatin 40 Mg Tabs (Simvastatin) .... 1/2 By Mouth Daily 3)  Hydrochlorothiazide 25 Mg Tabs (Hydrochlorothiazide) .... Take One Tablet By Mouth Daily. 4)  Lisinopril 20 Mg Tabs (Lisinopril) .... Take One Tablet By Mouth Daily 5)  Klor-Con M20 20 Meq Cr-Tabs (Potassium Chloride Crys Cr) .... Take 1 Tablet By Mouth Once A Day 6)  Coumadin 5 Mg Tabs (Warfarin Sodium) .... Take 1 Tab Daily or As Directed By Coumadin Clinic 7)  Viagra 50 Mg Tabs (Sildenafil Citrate) .... Take 1 Tablet By Mouth As Directed 8)  Amlodipine Besylate 5 Mg Tabs (Amlodipine Besylate) .... Take 1 Tablet Daily 9)  Sular 8.5 Mg Xr24h-Tab (Nisoldipine) .... Take One Tablet By Mouth Once Daily.  Allergies (verified): No Known Drug Allergies  Past History:  Past Medical History: ASCVD: Coronary artery bypass graft surgery in 1983; patent grafts on coronary angiography in 7/97 with     normal ejection fraction; total obstruction of the vein graft to the second marginal in 2004; stress nuclear and     echo in 2006: Inferior scar;  basilar inferior and posterior akinesis; ejection fraction of 45%; adenosine stress     nuclear study in 05/2008: Low normal left ventricular systolic function; severe hypokinesis of the basilar and mid   inferior and inferoseptal segments; inferior, inferolateral and inferoapical scarring without ischemia Cerebrovascular disease: Right carotid bruit; minimal plaque on ultrasound in 01/2004 Sick sinus syndrome: Sinus bradycardia and atrial fibrillation; dual-chamber Guidant pacemaker in 08/2004 Chronic anticoagulation Tobacco abuse: 20 pack years; discontinued in 1983. HYPERLIPIDEMIA (ICD-272.4) GOUT, UNSPECIFIED (ICD-274.9) HYPERTENSION (ICD-401.9) VERTIGO (ICD-780.4)  Past Surgical History: Reviewed history from 75/10/2008 and no changes required. S/P TURP S/P LS SPINE FUSION (2003) in 2006; prior cervical spine procedure with implantation of hardware Cholecystectomy CABG TIMES 3 (1983) APPENDECTOMY 2 KNEE SURGERYS (LEFT ) Guidant pacemaker implant-08/2004  Review of Systems       As stated in the HPI and negative for all other systems.   Vital Signs:  Patient profile:   77 year old male Height:      74 inches Weight:      194 pounds BMI:     25.00 Pulse rate:   71 / minute Resp:     16 per minute BP sitting:   138 / 90  (right arm)  Vitals Entered By: Marrion Coy, CNA (November 75, 2011 9:12 AM)  Physical Exam  General:  Well developed, well nourished, in no acute distress. Head:  normocephalic and atraumatic Eyes:  PERRLA/EOM intact; conjunctiva and lids normal. Neck:  Neck supple, no JVD. No masses, thyromegaly or abnormal cervical nodes. Chest Wall:  Well-healed pacemaker pocket and sternotomy scar Lungs:  Clear bilaterally to auscultation and percussion. Abdomen:  Bowel sounds positive; abdomen soft and non-tender without masses, organomegaly, or hernias noted. No hepatosplenomegaly. Msk:  Multiple surgical scars on the back Extremities:  No clubbing or  cyanosis. Neurologic:  Alert and oriented x 3. Skin:  Intact without lesions or rashes. Cervical Nodes:  no significant adenopathy Inguinal Nodes:  no significant adenopathy Psych:  Normal affect.   Detailed Cardiovascular Exam  Neck    Carotids: Carotids full and equal bilaterally without bruits.      Neck Veins: Normal, no JVD.    Heart    Inspection: no deformities or lifts noted.      Palpation: normal PMI with no thrills palpable.      Auscultation: regular rate and rhythm, S1, S2 without murmurs, rubs, gallops, or clicks.    Vascular    Abdominal Aorta: no palpable masses, pulsations, or audible bruits.      Femoral Pulses: normal femoral pulses bilaterally.      Pedal Pulses: normal pedal pulses bilaterally.      Radial Pulses: normal radial pulses bilaterally.      Peripheral Circulation: no clubbing, cyanosis, or edema noted with normal capillary refill.     EKG  Procedure date:  11/25/2009  Findings:      Atrial fibrillation with ventricular pacing  PPM Specifications Following MD:  [name], MD     PPM Vendor:  [facility] [employer]     PPM Model Number:  [id number]     PPM Serial Number:  [id number] PPM DOI:  08/12/2004      Lead 1    Location: RA     DOI: 08/12/2004     Model #: [id number]     Serial #: [id number]     Status: active Lead 2    Location: RV     DOI: 08/12/2004     Model #: [id number]     Serial #: [id number]     Status: active  Magnet Response Rate:  BOL 100 ERI  85  Indications:  [name]  Explantation Comments:  +Coumadin   PPM Follow Up Pacer Dependent:  Yes      Episodes Coumadin:  Yes  Parameters Mode:  VVIR     Lower Rate Limit:  60     Upper Rate Limit:  120  Impression & Recommendations:  Problem # 1:  ATHEROSCLEROTIC CARDIOVASCULAR DISEASE (ICD-429.2) He had mild plaque on carotid Doppler in 2006. I will repeat this. Orders: Carotid Duplex (Carotid Duplex)  Problem # 2:  HYPERLIPIDEMIA (ICD-272.4) I will repeat a lipid profile.  His Zocor was  reduced since he is on amlodipine.  If he is not at target with lipids I will change to lipitor.  Problem # 3:  HYPERTENSION (ICD-401.9) He is on 2 different calcium channel blockers. I will stop the Sular and increase his amlodipine to 10 mg daily. Orders: EKG w/ Interpretation (93000) Primary Care Referral (Primary)  Problem # 4:  CAD (ICD-414.00) He will continue with risk reduction. No further testing is indicated at this time.  Patient Instructions: 1)  Your physician recommends that you schedule a follow-up appointment in: 1year with Dr. Antoine Poche 2)  Your physician recommends that you return for a FASTING lipid profile , liver profile and BMP on day of carotid doppler. 414.01,272.0 3)  Your physician has recommended you make the following  change in your medication: Stop Sular. 4)  Increase Amlodipine to 10 mg by mouth daily. 5)  You have been referred to Dr. Sanda Linger for primary care. 6)  Your physician has requested that you have a carotid duplex. This test is an ultrasound of the carotid arteries in your neck. It looks at blood flow through these arteries that supply the brain with blood. Allow one hour for this exam. There are no restrictions or special instructions.

## 2010-02-11 NOTE — Medication Information (Signed)
Summary: rov..mp  Anticoagulant Therapy  Managed by: Bethena Midget, RN, BSN PCP: Dr. Nobie Putnam Supervising MD: Shirlee Latch MD, Morris Markham Indication 1: Atrial Fibrillation (ICD-427.31) Lab Used: [facility] [place] Site: [place] INR POC 1.9 INR RANGE 2 - 3   Dietary changes: no    Health status changes: no    Bleeding/hemorrhagic complications: no    Recent/future hospitalizations: no    Any changes in medication regimen? no    Recent/future dental: no  Any missed doses?: no       Is patient compliant with meds? yes       Allergies (verified): No Known Drug Allergies  Anticoagulation Management History:      The patient is taking warfarin and comes in today for a routine follow up visit.  Positive risk factors for bleeding include an age of 9 years or older.  The bleeding index is 'intermediate risk'.  Positive CHADS2 values include History of HTN.  Negative CHADS2 values include Age > 63 years old.  The start date was 09/25/2004.  Anticoagulation responsible provider: Shirlee Latch MD, Chong Wojdyla.  INR POC: 1.9.  Cuvette Lot#: [id number].  Exp: 05/2010.    Anticoagulation Management Assessment/Plan:      The patient's current anticoagulation dose is Coumadin 5 mg tabs: TAKE 1 TAB DAILY OR AS DIRECTED BY COUMADIN CLINIC.  The target INR is 2 - 3.  The next INR is due 05/20/2009.  Anticoagulation instructions were given to patient.  Results were reviewed/authorized by Bethena Midget, RN, BSN.  He was notified by Bethena Midget, RN, BSN.         Prior Anticoagulation Instructions: INR 2.3  Continue 1.5 tabs each Monday and Friday, and 1 tab other days.  Recheck in 4 weeks.    Current Anticoagulation Instructions: INR 1.9 Today take 2 pills then continue 1 pill everyday except 1.5 pills on Mondays and Fridays. Recheck in 3 weeks.   Appended Document: [facility]    Anticoagulant Therapy  Managed by: Bethena Midget, RN, BSN PCP: Dr. Nobie Putnam Supervising MD:  Shirlee Latch MD, Philomena Buttermore Indication 1: Atrial Fibrillation (ICD-427.31) Lab Used: [facility] [place] Site: [address] INR RANGE 2 - 3            Allergies: No Known Drug Allergies  Anticoagulation Management History:      Positive risk factors for bleeding include an age of 75 years or older.  The bleeding index is 'intermediate risk'.  Positive CHADS2 values include History of HTN.  Negative CHADS2 values include Age > 37 years old.  The start date was 09/25/2004.  Anticoagulation responsible provider: Shirlee Latch MD, Bani Gianfrancesco.  Exp: 05/2010.    Anticoagulation Management Assessment/Plan:      The patient's current anticoagulation dose is Coumadin 5 mg tabs: TAKE 1 TAB DAILY OR AS DIRECTED BY COUMADIN CLINIC.  The target INR is 2 - 3.  The next INR is due 05/20/2009.  Anticoagulation instructions were given to patient.  Results were reviewed/authorized by Bethena Midget, RN, BSN.         Prior Anticoagulation Instructions: INR 1.9 Today take 2 pills then continue 1 pill everyday except 1.5 pills on Mondays and Fridays. Recheck in 3 weeks.

## 2010-02-11 NOTE — Miscellaneous (Signed)
Summary: LABS CMP,05/28/2008  Clinical Lists Changes  Observations: Added new observation of CALCIUM: 9.4 mg/dL (16/10/9602 54:09) Added new observation of ALBUMIN: 4.1 g/dL (81/19/1478 29:56) Added new observation of PROTEIN, TOT: 7.0 g/dL (21/30/8657 84:69) Added new observation of SGPT (ALT): 13 units/L (05/28/2008 12:17) Added new observation of SGOT (AST): 25 units/L (05/28/2008 12:17) Added new observation of ALK PHOS: 55 units/L (05/28/2008 12:17) Added new observation of CREATININE: 0.87 mg/dL (62/95/2841 32:44) Added new observation of BUN: 14 mg/dL (01/14/7251 66:44) Added new observation of BG RANDOM: 92 mg/dL (03/47/4259 56:38) Added new observation of CO2 PLSM/SER: 33 meq/L (05/28/2008 12:17) Added new observation of CL SERUM: 100 meq/L (05/28/2008 12:17) Added new observation of K SERUM: 3.8 meq/L (05/28/2008 12:17) Added new observation of NA: 140 meq/L (05/28/2008 12:17)

## 2010-02-11 NOTE — Medication Information (Signed)
Summary: rov/sl  Anticoagulant Therapy  Managed by: Reina Fuse, PharmD PCP: Dr. Nobie Putnam Supervising MD: Gala Romney MD, Reuel Boom Indication 1: Atrial Fibrillation (ICD-427.31) Lab Used: [facility] [place] Site: [address] INR POC 2.3 INR RANGE 2 - 3   Dietary changes: no    Health status changes: no    Bleeding/hemorrhagic complications: no    Recent/future hospitalizations: no    Any changes in medication regimen? no    Recent/future dental: no  Any missed doses?: no       Is patient compliant with meds? yes      Comments: Patient interested in starting Pradaxa. Appt with Dr. [name] tomorrow to discuss.   Allergies: No Known Drug Allergies  Anticoagulation Management History:      The patient is taking warfarin and comes in today for a routine follow up visit.  Positive risk factors for bleeding include an age of 75 years or older.  The bleeding index is 'intermediate risk'.  Positive CHADS2 values include History of HTN.  Negative CHADS2 values include Age > 13 years old.  The start date was 09/25/2004.  Anticoagulation responsible provider: Bensimhon MD, Reuel Boom.  INR POC: 2.3.  Cuvette Lot#: [id number].  Exp: 10/2010.    Anticoagulation Management Assessment/Plan:      The patient's current anticoagulation dose is Coumadin 5 mg tabs: TAKE 1 TAB DAILY OR AS DIRECTED BY COUMADIN CLINIC.  The target INR is 2 - 3.  The next INR is due 09/30/2009.  Anticoagulation instructions were given to patient.  Results were reviewed/authorized by Reina Fuse, PharmD.  He was notified by Reina Fuse PharmD.         Prior Anticoagulation Instructions: INR 2.1  Continue same dose of 1 tab daily except for 1.5 tabs on Monday, Wednesday, and Friday.  Re-check in 4 weeks.  Current Anticoagulation Instructions: INR 2.3  Continue taking Coumadin 1 tab (5 mg) on Sun, Tue, Thur, Sat and Coumadin 1.5 tabs (7.5 mg) on Mon, Wed, Fri.  Return to clinic in 4 weeks.

## 2010-02-11 NOTE — Assessment & Plan Note (Signed)
Summary: DEVICE/SAF   Visit Type:  Follow-up Primary Provider:  Dr. Nobie Putnam   History of Present Illness: [name] returns today for PPM followup.  He is a pleasant 77 yo man with a h/o symptomatic bradycardia and is s/p PPM.  The patient also has CAD for which he underwent CABG in 1983.  He denies c/p, sob, or palpitations.  He has started walking several miles a day and denies any problems.    Current Medications (verified): 1)  Nitroglycerin 0.4 Mg Subl (Nitroglycerin) .... One Tablet Under Tongue Every 5 Minutes As Needed For Chest Pain---May Repeat Times Three 2)  Simvastatin 40 Mg Tabs (Simvastatin) .... Take One Tablet By Mouth Daily At Bedtime 3)  Hydrochlorothiazide 25 Mg Tabs (Hydrochlorothiazide) .... Take One Tablet By Mouth Daily. 4)  Lisinopril 20 Mg Tabs (Lisinopril) .... Take One Tablet By Mouth Daily 5)  Klor-Con M20 20 Meq Cr-Tabs (Potassium Chloride Crys Cr) .... Take 1 Tablet By Mouth Once A Day 6)  Coumadin 5 Mg Tabs (Warfarin Sodium) .... Take 1 Tab Daily or As Directed By Coumadin Clinic 7)  Viagra 50 Mg Tabs (Sildenafil Citrate) .... Take 1 Tablet By Mouth As Directed 8)  Amlodipine Besylate 5 Mg Tabs (Amlodipine Besylate) .... Take 1 Tablet Daily 9)  Sular 8.5 Mg Xr24h-Tab (Nisoldipine) .... Take One Tablet By Mouth Once Daily.  Allergies (verified): No Known Drug Allergies  Past History:  Past Medical History: Last updated: 11/21/2008 ASCVD: Coronary artery bypass graft surgery in 1983; patent grafts on coronary angiography in 7/97 with normal ejection fraction; total obstruction of the vein graft to the second marginal in 2004; stress nuclear and echo in 2006: Inferior scar; basilar inferior and posterior akinesis; ejection fraction of 45%; adenosine stress nuclear study in 05/2008: Low normal left ventricular systolic function; severe hypokinesis of the basilar and mid inferior and inferoseptal segments; inferior, inferolateral and inferoapical scarring  without ischemia _________________________________________ Cerebrovascular disease: Right carotid bruit; minimal plaque on ultrasound in 01/2004 Sick sinus syndrome: Sinus bradycardia and atrial fibrillation; dual-chamber Guidant pacemaker in 08/2004 Chronic anticoagulation Tobacco abuse: 20 pack years; discontinued in 1983. HYPERLIPIDEMIA (ICD-272.4) GOUT, UNSPECIFIED (ICD-274.9) HYPERTENSION (ICD-401.9) VERTIGO (ICD-780.4)  Past Surgical History: Last updated: 11/21/2008 S/P TURP S/P LS SPINE FUSION (2003) in 2006; prior cervical spine procedure with implantation of hardware Cholecystectomy CABG TIMES 3 (1983) APPENDECTOMY 2 KNEE SURGERYS (LEFT ) Guidant pacemaker implant-08/2004  Review of Systems  The patient denies chest pain, syncope, dyspnea on exertion, and peripheral edema.    Vital Signs:  Patient profile:   77 year old male Height:      74 inches Weight:      183 pounds BMI:     23.58 Pulse rate:   67 / minute BP sitting:   122 / 78  (left arm)  Vitals Entered By: [name] CMA (September 03, 2009 2:43 PM)  Physical Exam  General:   General-Well developed; no acute distress: Weight is proportionate Neck-No JVD; no carotid bruits; marked decrease in range of motion Lungs-No tachypnea, no rales; no rhonchi; no wheezes; straight back Cardiovascular-normal PMI; normal S1 and S2:minimal systolic murmur Abdomen-BS normal; soft and non-tender without masses or organomegaly:  Musculoskeletal-No deformities, no cyanosis or clubbing: Neurologic-Normal cranial nerves; symmetric strength and tone:  Skin-Warm, erythematous patches, particularly over the face Extremities-Nl distal pulses; no edema:     PPM Specifications Following MD:  [name], MD     PPM Vendor:  [facility] [employer]     PPM Model  Number:  1291     PPM Serial Number:  [id number] PPM DOI:  08/12/2004      Lead 1    Location: RA     DOI: 08/12/2004     Model #: [id number]     Serial #: [id number]     Status:  active Lead 2    Location: RV     DOI: 08/12/2004     Model #: [id number]     Serial #: [id number]     Status: active  Magnet Response Rate:  BOL 100 ERI  85  Indications:  [name]  Explantation Comments:  +Coumadin   PPM Follow Up Battery Voltage:  GOOD V     Battery Est. Longevity:  >5 YRS     Pacer Dependent:  Yes     Right Ventricle  Amplitude: PACED mV, Impedance: 550 ohms, Threshold: 1.5 V at 0.40 msec  Episodes Coumadin:  Yes  Parameters Mode:  VVIR     Lower Rate Limit:  60     Upper Rate Limit:  120 Next Cardiology Appt Due:  02/12/2010 Tech Comments:  2 VHR EPISODES.  NORMAL DEVICE FUNCTION.  NO CHANGES MADE.  ROV IN 6 MTHS W/DEVICE CLINIC. [name]  September 03, 2009 3:02 PM MD Comments:  Agree with above.  Impression & Recommendations:  Problem # 1:  CARDIAC PACEMAKER IN SITU (ICD-V45.01) His device is working normally.  Will recheck in several months.  Problem # 2:  ATRIAL FIBRILLATION (ICD-427.31) He is maintaining NSR with no mode switches. His updated medication list for this problem includes:    Coumadin 5 Mg Tabs (Warfarin sodium) .Marland Kitchen... Take 1 tab daily or as directed by coumadin clinic  Problem # 3:  COUMADIN THERAPY (ICD-V58.61) The patient is interested in switching to Pradaxa.  He will look into the pricing and will call us if he would like to switch.  Problem # 4:  HYPERTENSION (ICD-401.9) His blood pressure is well controlled.  Continue meds as below, and maintain a low sodium diet. His updated medication list for this problem includes:    Hydrochlorothiazide 25 Mg Tabs (Hydrochlorothiazide) .Marland Kitchen... Take one tablet by mouth daily.    Lisinopril 20 Mg Tabs (Lisinopril) .Marland Kitchen... Take one tablet by mouth daily    Amlodipine Besylate 5 Mg Tabs (Amlodipine besylate) .Marland Kitchen... Take 1 tablet daily    Sular 8.5 Mg Xr24h-tab (Nisoldipine) .Marland Kitchen... Take one tablet by mouth once daily.  Patient Instructions: 1)  Your physician recommends that you schedule a follow-up  appointment in: 12 months with Dr [name]

## 2010-02-11 NOTE — Assessment & Plan Note (Signed)
Summary: NEW/ MEDICARE/TRICARE/[place]  #   Vital Signs:  Patient profile:   77 year old male Height:      74 inches Weight:      188 pounds BMI:     24.23 O2 Sat:      98 % on Room air Temp:     98.1 degrees F oral Pulse rate:   60 / minute Pulse rhythm:   regular Resp:     16 per minute BP sitting:   120 / 68  (left arm) Cuff size:   large  Vitals Entered By: Rock Nephew CMA (December 10, 2009 9:17 AM)  O2 Flow:  Room air CC: New pt CPX w/ labs, Preventive Care, Hypertension Management, Lipid Management Is Patient Diabetic? No Pain Assessment Patient in pain? no       Does patient need assistance? Functional Status Self care Ambulation Normal   Primary Care Provider:  Etta Grandchild MD  CC:  New pt CPX w/ labs, Preventive Care, Hypertension Management, and Lipid Management.  History of Present Illness: Here for Medicare AWV:  1.   Risk factors based on Past M, S, F history: yes 2.   Physical Activities: very active 3.   Depression/mood: mood is excellent 4.   Hearing: hears well with aids 5.   ADL's: very thorough and independent 6.   Fall Risk: none noted 7.   Home Safety: very good 8.   Height, weight, &visual acuity: done 9.   Counseling: done 10.   Labs ordered based on risk factors: yes 11.           Referral Coordination: none today 12.           Care Plan: completed 13.            Cognitive Assessment : very good responses to questions  Hypertension History:      He denies headache, chest pain, palpitations, dyspnea with exertion, orthopnea, PND, peripheral edema, visual symptoms, neurologic problems, syncope, and side effects from treatment.  He notes no problems with any antihypertensive medication side effects.        Positive major cardiovascular risk factors include male age 22 years old or older, hyperlipidemia, and hypertension.  Negative major cardiovascular risk factors include negative family history for ischemic heart disease and  non-tobacco-user status.        Positive history for target organ damage include ASHD (either angina/prior MI/prior CABG).  Further assessment for target organ damage reveals no history of cardiac end-organ damage (CHF/LVH), stroke/TIA, peripheral vascular disease, renal insufficiency, or hypertensive retinopathy.    Lipid Management History:      Positive NCEP/ATP III risk factors include male age 25 years old or older, hypertension, and ASHD (either angina/prior MI/prior CABG).  Negative NCEP/ATP III risk factors include no family history for ischemic heart disease, non-tobacco-user status, no prior stroke/TIA, no peripheral vascular disease, and no history of aortic aneurysm.        The patient states that he knows about the "Therapeutic Lifestyle Change" diet.  His compliance with the TLC diet is excellent.  The patient expresses understanding of adjunctive measures for cholesterol lowering.  Adjunctive measures started by the patient include aerobic exercise, fiber, limit alcohol consumpton, and weight reduction.  He expresses no side effects from his lipid-lowering medication.  The patient denies any symptoms to suggest myopathy or liver disease.     Preventive Screening-Counseling & Management  Alcohol-Tobacco     Alcohol drinks/day: 0  Alcohol Counseling: not indicated; patient does not drink     Smoking Status: quit     Tobacco Counseling: to remain off tobacco products  Hep-HIV-STD-Contraception     Hepatitis Risk: no risk noted     HIV Risk: no risk noted     STD Risk: no risk noted     Dental Visit-last 6 months yes     Dental Care Counseling: to seek dental care; no dental care within six months     TSE monthly: yes     Testicular SE Education/Counseling to perform regular STE     Sun Exposure-Excessive: yes     Sun Exposure Counseling: to decrease sun exposure  Safety-Violence-Falls     Seat Belt Use: yes     Helmet Use: yes     Firearms in the Home: firearms in the  home     Firearm Counseling: to practice firearm safety     Smoke Detectors: yes     Smoke Detector Counseling: yes     Violence in the Home: no risk noted     Sexual Abuse: no      Sexual History:  currently monogamous.        Drug Use:  never.        Blood Transfusions:  yes and prior to 2001.    Clinical Review Panels:  Prevention   Last Colonoscopy:  normal (01/12/2009)  Immunizations   Last Tetanus Booster:  Historical (01/13/2004)  Diabetes Management   Creatinine:  0.87 (05/28/2008)  CBC   WBC:  6.1 (05/28/2008)   Hgb:  14.6 (05/28/2008)   Hct:  42.1 (05/28/2008)   Platelets:  181 (05/28/2008)   MCV  96.9 (05/28/2008)  Complete Metabolic Panel   Glucose:  92 (05/28/2008)   Sodium:  140 (05/28/2008)   Potassium:  3.8 (05/28/2008)   Chloride:  100 (05/28/2008)   CO2:  33 (05/28/2008)   BUN:  14 (05/28/2008)   Creatinine:  0.87 (05/28/2008)   Albumin:  4.1 (05/28/2008)   Total Protein:  7.0 (05/28/2008)   Calcium:  9.4 (05/28/2008)   Alk Phos:  55 (05/28/2008)   SGPT (ALT):  13 (05/28/2008)   SGOT (AST):  25 (05/28/2008)   Medications Prior to Update: 1)  Nitroglycerin 0.4 Mg Subl (Nitroglycerin) .... One Tablet Under Tongue Every 5 Minutes As Needed For Chest Pain---May Repeat Times Three 2)  Simvastatin 40 Mg Tabs (Simvastatin) .... 1/2 By Mouth Daily 3)  Hydrochlorothiazide 25 Mg Tabs (Hydrochlorothiazide) .... Take One Tablet By Mouth Daily. 4)  Lisinopril 20 Mg Tabs (Lisinopril) .... Take One Tablet By Mouth Daily 5)  Klor-Con M20 20 Meq Cr-Tabs (Potassium Chloride Crys Cr) .... Take 1 Tablet By Mouth Once A Day 6)  Coumadin 5 Mg Tabs (Warfarin Sodium) .... Take 1 Tab Daily or As Directed By Coumadin Clinic 7)  Viagra 50 Mg Tabs (Sildenafil Citrate) .... Take 1 Tablet By Mouth As Directed 8)  Amlodipine Besylate 10 Mg Tabs (Amlodipine Besylate) .... Take One Tablet By Mouth Daily  Current Medications (verified): 1)  Nitroglycerin 0.4 Mg Subl  (Nitroglycerin) .... One Tablet Under Tongue Every 5 Minutes As Needed For Chest Pain---May Repeat Times Three 2)  Simvastatin 40 Mg Tabs (Simvastatin) .... 1/2 By Mouth Daily 3)  Hydrochlorothiazide 25 Mg Tabs (Hydrochlorothiazide) .... Take One Tablet By Mouth Daily. 4)  Lisinopril 20 Mg Tabs (Lisinopril) .... Take One Tablet By Mouth Daily 5)  Klor-Con M20 20 Meq Cr-Tabs (Potassium Chloride Crys Cr) .... Take 1  Tablet By Mouth Once A Day 6)  Coumadin 5 Mg Tabs (Warfarin Sodium) .... Take 1 Tab Daily or As Directed By Coumadin Clinic 7)  Viagra 50 Mg Tabs (Sildenafil Citrate) .... Take 1 Tablet By Mouth As Directed 8)  Amlodipine Besylate 10 Mg Tabs (Amlodipine Besylate) .... Take One Tablet By Mouth Daily  Allergies (verified): No Known Drug Allergies  Past History:  Family History: Last updated: 2008-06-10 Father:DECEASED DUE TO UNKNOWN CAUSE Mother:ALIVE AND WELL IN HER 90'S  Social History: Last updated: 12/10/2009 Retired [employer] Divorced  Tobacco Use - Former.  Alcohol Use - no Regular Exercise - yes Drug Use - no reMarried  Risk Factors: Alcohol Use: 0 (12/10/2009) Exercise: yes (2008/06/10)  Risk Factors: Smoking Status: quit (12/10/2009)  Past Medical History: ASCVD: Coronary artery bypass graft surgery in 1983; patent grafts on coronary angiography in 7/97 with     normal ejection fraction; total obstruction of the vein graft to the second marginal in 2004; stress nuclear and     echo in 2006: Inferior scar; basilar inferior and posterior akinesis; ejection fraction of 45%; adenosine stress     nuclear study in 05/2008: Low normal left ventricular systolic function; severe hypokinesis of the basilar and mid   inferior and inferoseptal segments; inferior, inferolateral and inferoapical scarring without ischemia Cerebrovascular disease: Right carotid bruit; minimal plaque on ultrasound in 01/2004 Sick sinus syndrome: Sinus bradycardia and atrial  fibrillation; dual-chamber Guidant pacemaker in 08/2004 Chronic anticoagulation Tobacco abuse: 20 pack years; discontinued in 1983. HYPERLIPIDEMIA (ICD-272.4) GOUT, UNSPECIFIED (ICD-274.9) HYPERTENSION (ICD-401.9) VERTIGO (ICD-780.4) Osteoarthritis  Past Surgical History: Reviewed history from 11/21/2008 and no changes required. S/P TURP S/P LS SPINE FUSION (2003) in 2006; prior cervical spine procedure with implantation of hardware Cholecystectomy CABG TIMES 3 (1983) APPENDECTOMY 2 KNEE SURGERYS (LEFT ) Guidant pacemaker implant-08/2004  Family History: Reviewed history from 06-10-2008 and no changes required. Father:DECEASED DUE TO UNKNOWN CAUSE Mother:ALIVE AND WELL IN HER 90'S  Social History: Reviewed history from Jun 10, 2008 and no changes required. Retired [employer] Divorced  Tobacco Use - Former.  Alcohol Use - no Regular Exercise - yes Drug Use - no reMarried Hepatitis Risk:  no risk noted HIV Risk:  no risk noted STD Risk:  no risk noted Dental Care w/in 6 mos.:  yes Sun Exposure-Excessive:  yes Seat Belt Use:  yes Sexual History:  currently monogamous Drug Use:  never Blood Transfusions:  yes, prior to 2001  Review of Systems       The patient complains of weight gain.  The patient denies anorexia, fever, weight loss, chest pain, syncope, dyspnea on exertion, peripheral edema, prolonged cough, headaches, hemoptysis, abdominal pain, melena, hematochezia, severe indigestion/heartburn, hematuria, suspicious skin lesions, transient blindness, difficulty walking, depression, abnormal bleeding, enlarged lymph nodes, angioedema, and testicular masses.   GU:  Complains of erectile dysfunction, nocturia, and urinary frequency; denies decreased libido, discharge, dysuria, hematuria, incontinence, and urinary hesitancy.  Physical Exam  General:  alert, well-developed, well-nourished, well-hydrated, appropriate dress, normal appearance, healthy-appearing,  cooperative to examination, and good hygiene.   Head:  normocephalic, atraumatic, no abnormalities observed, and no abnormalities palpated.   Eyes:  vision grossly intact, pupils equal, and no retinal abnormalitiies.   Nose:  External nasal examination shows no deformity or inflammation. Nasal mucosa are pink and moist without lesions or exudates. Mouth:  Oral mucosa and oropharynx without lesions or exudates.  Teeth in good repair. Neck:  Neck supple, no JVD. No masses, thyromegaly or abnormal cervical nodes.  Lungs:  normal respiratory effort, no intercostal retractions, no accessory muscle use, normal breath sounds, no dullness, no fremitus, no crackles, and no wheezes.   Heart:  normal rate, regular rhythm, no murmur, no gallop, no rub, and no JVD.   Abdomen:  soft, non-tender, normal bowel sounds, no distention, no masses, no guarding, no rigidity, no rebound tenderness, no abdominal hernia, no inguinal hernia, no hepatomegaly, no splenomegaly, and abdominal scar(s).   Rectal:  No external abnormalities noted. Normal sphincter tone. No rectal masses or tenderness. Genitalia:  uncircumcised, no hydrocele, no varicocele, no scrotal masses, no testicular masses or atrophy, no cutaneous lesions, and no urethral discharge.   Prostate:  no nodules, no asymmetry, no induration, and 1+ enlarged.   Msk:  normal ROM, no joint tenderness, no joint swelling, no joint warmth, no redness over joints, no joint deformities, no joint instability, no crepitation, and no muscle atrophy.   Pulses:  R and L carotid,radial,femoral,dorsalis pedis and posterior tibial pulses are full and equal bilaterally Extremities:  No clubbing, cyanosis, edema, or deformity noted with normal full range of motion of all joints.   Neurologic:  No cranial nerve deficits noted. Station and gait are normal. Plantar reflexes are down-going bilaterally. DTRs are symmetrical throughout. Sensory, motor and coordinative functions appear  intact. Skin:  turgor normal, no rashes, no suspicious lesions, no ecchymoses, no petechiae, no purpura, no ulcerations, no edema, excessive tan, and solar damage.   Cervical Nodes:  no anterior cervical adenopathy and no posterior cervical adenopathy.   Axillary Nodes:  no R axillary adenopathy and no L axillary adenopathy.   Inguinal Nodes:  no R inguinal adenopathy and no L inguinal adenopathy.   Psych:  Oriented X3, normally interactive, good eye contact, not anxious appearing, not depressed appearing, not agitated, not suicidal, easily distracted, poor concentration, and memory impairment.     Impression & Recommendations:  Problem # 1:  ROUTINE GENERAL MEDICAL EXAM@HEALTH  CARE FACL (ICD-V70.0) Assessment New  Colonoscopy: normal (01/12/2009) Td Booster: Historical (01/13/2004)    Discussed using sunscreen, use of alcohol, drug use, self testicular exam, routine dental care, routine eye care, routine physical exam, seat belts, multiple vitamins, osteoporosis prevention, adequate calcium intake in diet, and recommendations for immunizations.  Discussed exercise and checking cholesterol.  Also recommend checking PSA.  Orders: [facility] -Subsequent Annual Wellness Visit [phone]) Hemoccult Guaiac-1 spec.(in office) (82270)  Problem # 2:  HYPERTROPHY PROSTATE W/UR OBST & OTH LUTS (ICD-600.01) Assessment: New  Orders: Venipuncture (82956) TLB-Lipid Panel (80061-LIPID) TLB-BMP (Basic Metabolic Panel-BMET) (80048-METABOL) TLB-CBC Platelet - w/Differential (85025-CBCD) TLB-Hepatic/Liver Function Pnl (80076-HEPATIC) TLB-TSH (Thyroid Stimulating Hormone) (84443-TSH) TLB-PSA (Prostate Specific Antigen) (84153-PSA) TLB-Udip w/ Micro (81001-URINE) TLB-Uric Acid, Blood (84550-URIC) DRE (O1308) Prostate / PSA (Medicare) (M5784)  Problem # 3:  HYPERTENSION (ICD-401.9) Assessment: Improved  His updated medication list for this problem includes:    Hydrochlorothiazide 25 Mg Tabs  (Hydrochlorothiazide) .Marland Kitchen... Take one tablet by mouth daily.    Lisinopril 20 Mg Tabs (Lisinopril) .Marland Kitchen... Take one tablet by mouth daily    Amlodipine Besylate 10 Mg Tabs (Amlodipine besylate) .Marland Kitchen... Take one tablet by mouth daily  Orders: Venipuncture (69629) TLB-Lipid Panel (80061-LIPID) TLB-BMP (Basic Metabolic Panel-BMET) (80048-METABOL) TLB-CBC Platelet - w/Differential (85025-CBCD) TLB-Hepatic/Liver Function Pnl (80076-HEPATIC) TLB-TSH (Thyroid Stimulating Hormone) (84443-TSH) TLB-PSA (Prostate Specific Antigen) (84153-PSA) TLB-Udip w/ Micro (81001-URINE) TLB-Uric Acid, Blood (84550-URIC)  BP today: 120/68 Prior BP: 138/90 (11/25/2009)  Labs Reviewed: K+: 3.8 (05/28/2008) Creat: : 0.87 (05/28/2008)     Complete Medication List: 1)  Nitroglycerin 0.4 Mg Subl (Nitroglycerin) .... One tablet under tongue every 5 minutes as needed for chest pain---may repeat times three 2)  Simvastatin 40 Mg Tabs (Simvastatin) .... 1/2 by mouth daily 3)  Hydrochlorothiazide 25 Mg Tabs (Hydrochlorothiazide) .... Take one tablet by mouth daily. 4)  Lisinopril 20 Mg Tabs (Lisinopril) .... Take one tablet by mouth daily 5)  Klor-con M20 20 Meq Cr-tabs (Potassium chloride crys cr) .... Take 1 tablet by mouth once a day 6)  Coumadin 5 Mg Tabs (Warfarin sodium) .... Take 1 tab daily or as directed by coumadin clinic 7)  Viagra 50 Mg Tabs (Sildenafil citrate) .... Take 1 tablet by mouth as directed 8)  Amlodipine Besylate 10 Mg Tabs (Amlodipine besylate) .... Take one tablet by mouth daily  Hypertension Assessment/Plan:      The patient's hypertensive risk group is category C: Target organ damage and/or diabetes.  Today's blood pressure is 120/68.  His blood pressure goal is < 140/90.  Lipid Assessment/Plan:      Based on NCEP/ATP III, the patient's risk factor category is "history of coronary disease, peripheral vascular disease, cerebrovascular disease, or aortic aneurysm".  The patient's lipid goals are  as follows: Total cholesterol goal is 200; LDL cholesterol goal is 100; HDL cholesterol goal is 40; Triglyceride goal is 150.    Colorectal Screening:  Current Recommendations:    Hemoccult: NEG X 1 today  PSA Screening:    Reviewed PSA screening recommendations: PSA ordered  Immunization & Chemoprophylaxis:    Tetanus vaccine: Historical  (01/13/2004)   Patient Instructions: 1)  Please schedule a follow-up appointment in 1 month. 2)  It is important that you exercise regularly at least 20 minutes 5 times a week. If you develop chest pain, have severe difficulty breathing, or feel very tired , stop exercising immediately and seek medical attention.   Orders Added: 1)  Venipuncture [36415] 2)  TLB-Lipid Panel [80061-LIPID] 3)  TLB-BMP (Basic Metabolic Panel-BMET) [80048-METABOL] 4)  TLB-CBC Platelet - w/Differential [85025-CBCD] 5)  TLB-Hepatic/Liver Function Pnl [80076-HEPATIC] 6)  TLB-TSH (Thyroid Stimulating Hormone) [84443-TSH] 7)  TLB-PSA (Prostate Specific Antigen) [84153-PSA] 8)  TLB-Udip w/ Micro [81001-URINE] 9)  TLB-Uric Acid, Blood [84550-URIC] 10)  [facility] -Subsequent Annual Wellness Visit [G0439] 11)  DRE [G0102] 12)  Prostate / PSA (Medicare) [G0103] 13)  Hemoccult Guaiac-1 spec.(in office) [82270] 14)  New Patient Level III [04540]    Preventive Care Screening  Colonoscopy:    Date:  01/12/2009    Results:  normal   Last Tetanus Booster:    Date:  01/13/2004    Results:  Historical      Prevention & Chronic Care Immunizations   Influenza vaccine: Not documented   Influenza vaccine deferral: Refused  (12/10/2009)    Tetanus booster: 01/13/2004: Historical    Pneumococcal vaccine: Not documented   Pneumococcal vaccine deferral: Refused  (12/10/2009)    H. zoster vaccine: Not documented   H. zoster vaccine deferral: Refused  (12/10/2009)  Colorectal Screening   Hemoccult: Not documented   Hemoccult action/deferral: NEG X 1 today  (12/10/2009)     Colonoscopy: normal  (01/12/2009)  Other Screening   PSA: Not documented   PSA ordered.   PSA action/deferral: PSA ordered  (12/10/2009)   Smoking status: quit  (12/10/2009)  Lipids   Total Cholesterol: Not documented   LDL: Not documented   LDL Direct: Not documented   HDL: Not documented   Triglycerides: Not documented    SGOT (AST): 25  (05/28/2008)  SGPT (ALT): 13  (05/28/2008)   Alkaline phosphatase: 55  (05/28/2008)   Total bilirubin: Not documented  Hypertension   Last Blood Pressure: 120 / 68  (12/10/2009)   Serum creatinine: 0.87  (05/28/2008)   Serum potassium 3.8  (05/28/2008)  Self-Management Support :    Hypertension self-management support: Not documented    Lipid self-management support: Not documented

## 2010-02-11 NOTE — Progress Notes (Signed)
  Phone Note Call from Patient Call back at Home Phone [phone]   Caller: Patient Call For: Etta Grandchild MD Summary of Call: Pt wants to know how soon you want to see him back due to ?infection in his lab results? Please advise. Initial call taken by: Verdell Face,  December 12, 2009 12:08 PM  Follow-up for Phone Call        in the next 2-3 weeks Follow-up by: Etta Grandchild MD,  December 12, 2009 12:22 PM  Additional Follow-up for Phone Call Additional follow up Details #1::        Pt set up appt for 12/15 w/Dr Yetta Barre. Additional Follow-up by: Verdell Face,  December 12, 2009 12:45 PM

## 2010-02-11 NOTE — Letter (Signed)
Summary: Lipid Letter  [facility]  [address] [place], [place] [address]   Phone: [phone]  Fax: [phone]    12/10/2009  Cassey Hurrell [address] [place], [place]  [address]  Dear Leonette Most:  We have carefully reviewed your last lipid profile from 12/10/2009 and the results are noted below with a summary of recommendations for lipid management.    Cholesterol:       200     Goal: <200   HDL "good" Cholesterol:   62.95     Goal: >40   LDL "bad" Cholesterol:   113     Goal: <100   Triglycerides:       155.0     Goal: <150        TLC Diet (Therapeutic Lifestyle Change): Saturated Fats & Transfatty acids should be kept < 7% of total calories ***Reduce Saturated Fats Polyunstaurated Fat can be up to 10% of total calories Monounsaturated Fat Fat can be up to 20% of total calories Total Fat should be no greater than 25-35% of total calories Carbohydrates should be 50-60% of total calories Protein should be approximately 15% of total calories Fiber should be at least 20-30 grams a day ***Increased fiber may help lower LDL Total Cholesterol should be < 200mg /day Consider adding plant stanol/sterols to diet (example: Benacol spread) ***A higher intake of unsaturated fat may reduce Triglycerides and Increase HDL    Adjunctive Measures (may lower LIPIDS and reduce risk of Heart Attack) include: Aerobic Exercise (20-30 minutes 3-4 times a week) Limit Alcohol Consumption Weight Reduction Aspirin 75-81 mg a day by mouth (if not allergic or contraindicated) Dietary Fiber 20-30 grams a day by mouth     Current Medications: 1)    Nitroglycerin 0.4 Mg Subl (Nitroglycerin) .... One tablet under tongue every 5 minutes as needed for chest pain---may repeat times three 2)    Simvastatin 40 Mg Tabs (Simvastatin) .... 1/2 by mouth daily 3)    Hydrochlorothiazide 25 Mg Tabs (Hydrochlorothiazide) .... Take one tablet by mouth daily. 4)    Lisinopril 20 Mg Tabs  (Lisinopril) .... Take one tablet by mouth daily 5)    Klor-con M20 20 Meq Cr-tabs (Potassium chloride crys cr) .... Take 1 tablet by mouth once a day 6)    Coumadin 5 Mg Tabs (Warfarin sodium) .... Take 1 tab daily or as directed by coumadin clinic 7)    Viagra 50 Mg Tabs (Sildenafil citrate) .... Take 1 tablet by mouth as directed 8)    Amlodipine Besylate 10 Mg Tabs (Amlodipine besylate) .... Take one tablet by mouth daily  If you have any questions, please call. We appreciate being able to work with you.   Sincerely,    [facility] Etta Grandchild MD

## 2010-02-11 NOTE — Medication Information (Signed)
Summary: rov/ewj  Anticoagulant Therapy  Managed by: Bethena Midget, RN, BSN PCP: Dr. Nobie Putnam Supervising MD: Daleen Squibb MD, Maisie Fus Indication 1: Atrial Fibrillation (ICD-427.31) Lab Used: [facility] [place] Site: [address] INR POC 2.6 INR RANGE 2 - 3   Dietary changes: no    Health status changes: no    Bleeding/hemorrhagic complications: no    Recent/future hospitalizations: no    Any changes in medication regimen? no    Recent/future dental: no  Any missed doses?: no       Is patient compliant with meds? yes       Allergies: No Known Drug Allergies  Anticoagulation Management History:      The patient is taking warfarin and comes in today for a routine follow up visit.  Positive risk factors for bleeding include an age of 75 years or older.  The bleeding index is 'intermediate risk'.  Positive CHADS2 values include History of HTN and Age > 84 years old.  The start date was 09/25/2004.  Anticoagulation responsible provider: Daleen Squibb MD, Maisie Fus.  INR POC: 2.6.  Cuvette Lot#: [id number].  Exp: 11/2010.    Anticoagulation Management Assessment/Plan:      The patient's current anticoagulation dose is Coumadin 5 mg tabs: TAKE 1 TAB DAILY OR AS DIRECTED BY COUMADIN CLINIC.  The target INR is 2 - 3.  The next INR is due 10/28/2009.  Anticoagulation instructions were given to patient.  Results were reviewed/authorized by Bethena Midget, RN, BSN.  He was notified by Bethena Midget, RN, BSN.         Prior Anticoagulation Instructions: INR 1.9  Take 2 tablets today, then resume same dosage 1 tablet daily except 1.5 tablets on Mondays, Wednesdays, and Fridays.  Recheck in 4 weeks.    Current Anticoagulation Instructions: inr 2.6 Continue 5mg s daily except 7.5mg s on M,W&F. Recheck in 4 weeks.

## 2010-02-11 NOTE — Medication Information (Signed)
Summary: rov/sl  Anticoagulant Therapy  Managed by: Cloyde Reams, RN, BSN PCP: Dr. Nobie Putnam Supervising MD: Excell Seltzer MD, Casimiro Needle Indication 1: Atrial Fibrillation (ICD-427.31) Lab Used: [facility] [place] Site: [address] INR POC 1.9 INR RANGE 2 - 3   Dietary changes: no    Health status changes: no    Bleeding/hemorrhagic complications: no    Recent/future hospitalizations: no    Any changes in medication regimen? no    Recent/future dental: no  Any missed doses?: no       Is patient compliant with meds? yes       Allergies: No Known Drug Allergies  Anticoagulation Management History:      The patient is taking warfarin and comes in today for a routine follow up visit.  Positive risk factors for bleeding include an age of 75 years or older.  The bleeding index is 'intermediate risk'.  Positive CHADS2 values include History of HTN and Age > 30 years old.  The start date was 09/25/2004.  Anticoagulation responsible provider: Excell Seltzer MD, Casimiro Needle.  INR POC: 1.9.  Cuvette Lot#: [id number].  Exp: 10/2010.    Anticoagulation Management Assessment/Plan:      The patient's current anticoagulation dose is Coumadin 5 mg tabs: TAKE 1 TAB DAILY OR AS DIRECTED BY COUMADIN CLINIC.  The target INR is 2 - 3.  The next INR is due 10/28/2009.  Anticoagulation instructions were given to patient.  Results were reviewed/authorized by Cloyde Reams, RN, BSN.  He was notified by Cloyde Reams RN.         Prior Anticoagulation Instructions: INR 2.3  Continue taking Coumadin 1 tab (5 mg) on Sun, Tue, Thur, Sat and Coumadin 1.5 tabs (7.5 mg) on Mon, Wed, Fri.  Return to clinic in 4 weeks.   Current Anticoagulation Instructions: INR 1.9  Take 2 tablets today, then resume same dosage 1 tablet daily except 1.5 tablets on Mondays, Wednesdays, and Fridays.  Recheck in 4 weeks.

## 2010-02-11 NOTE — Letter (Signed)
Summary: Handout Printed  Printed Handout:  - Coumadin Instructions-w/out Meds 

## 2010-02-11 NOTE — Assessment & Plan Note (Signed)
Summary: 6 mth f/u per checkout on 09/05/08/tg   Visit Type:  Follow-up Primary Provider:  Dr. Nobie Putnam   History of Present Illness: [name] returns today for PPM followup.  He is a pleasant 77 yo man with a h/o symptomatic bradycardia and is s/p PPM.  The patient also has CAD for which he underwent CABG in 1983.  He denies c/p, sob, or palpitations.  He has undergone cervical spine surgery.    Current Medications (verified): 1)  Nitroglycerin 0.4 Mg Subl (Nitroglycerin) .... One Tablet Under Tongue Every 5 Minutes As Needed For Chest Pain---May Repeat Times Three 2)  Simvastatin 40 Mg Tabs (Simvastatin) .... Take One Tablet By Mouth Daily At Bedtime 3)  Hydrochlorothiazide 25 Mg Tabs (Hydrochlorothiazide) .... Take One Tablet By Mouth Daily. 4)  Lisinopril 20 Mg Tabs (Lisinopril) .... Take One Tablet By Mouth Daily 5)  Klor-Con M20 20 Meq Cr-Tabs (Potassium Chloride Crys Cr) .... Take 1 Tablet By Mouth Once A Day 6)  Coumadin 5 Mg Tabs (Warfarin Sodium) .... Take 1 Tab Daily or As Directed By Coumadin Clinic 7)  Viagra 50 Mg Tabs (Sildenafil Citrate) .... Take 1 Tablet By Mouth As Directed 8)  Amlodipine Besylate 5 Mg Tabs (Amlodipine Besylate) .... Take 1 Tab Daily 9)  Sular 8.5 Mg Xr24h-Tab (Nisoldipine) .... Take One Tablet By Mouth Once Daily.  Allergies (verified): No Known Drug Allergies  Past History:  Past Medical History: Last updated: 11/21/2008 ASCVD: Coronary artery bypass graft surgery in 1983; patent grafts on coronary angiography in 7/97 with normal ejection fraction; total obstruction of the vein graft to the second marginal in 2004; stress nuclear and echo in 2006: Inferior scar; basilar inferior and posterior akinesis; ejection fraction of 45%; adenosine stress nuclear study in 05/2008: Low normal left ventricular systolic function; severe hypokinesis of the basilar and mid inferior and inferoseptal segments; inferior, inferolateral and inferoapical scarring without  ischemia _________________________________________ Cerebrovascular disease: Right carotid bruit; minimal plaque on ultrasound in 01/2004 Sick sinus syndrome: Sinus bradycardia and atrial fibrillation; dual-chamber Guidant pacemaker in 08/2004 Chronic anticoagulation Tobacco abuse: 20 pack years; discontinued in 1983. HYPERLIPIDEMIA (ICD-272.4) GOUT, UNSPECIFIED (ICD-274.9) HYPERTENSION (ICD-401.9) VERTIGO (ICD-780.4)  Past Surgical History: Last updated: 11/21/2008 S/P TURP S/P LS SPINE FUSION (2003) in 2006; prior cervical spine procedure with implantation of hardware Cholecystectomy CABG TIMES 3 (1983) APPENDECTOMY 2 KNEE SURGERYS (LEFT ) Guidant pacemaker implant-08/2004  Review of Systems  The patient denies chest pain, syncope, dyspnea on exertion, and peripheral edema.    Vital Signs:  Patient profile:   77 year old male Height:      74 inches Weight:      191 pounds BMI:     24.61 Pulse rate:   61 / minute BP sitting:   120 / 64  (left arm)  Vitals Entered By: Laurance Flatten CMA (March 11, 2009 2:54 PM)  Physical Exam  General:   General-Well developed; no acute distress: Weight is proportionate Neck-No JVD; no carotid bruits; marked decrease in range of motion Lungs-No tachypnea, no rales; no rhonchi; no wheezes; straight back Cardiovascular-normal PMI; normal S1 and S2:minimal systolic murmur Abdomen-BS normal; soft and non-tender without masses or organomegaly:  Musculoskeletal-No deformities, no cyanosis or clubbing: Neurologic-Normal cranial nerves; symmetric strength and tone:  Skin-Warm, erythematous patches, particularly over the face Extremities-Nl distal pulses; no edema:     PPM Specifications Following MD:  [name], MD     PPM Vendor:  [facility] [employer]     PPM Model  Number:  1291     PPM Serial Number:  [id number] PPM DOI:  08/12/2004      Lead 1    Location: RA     DOI: 08/12/2004     Model #: [id number]     Serial #: [id number]     Status:  active Lead 2    Location: RV     DOI: 08/12/2004     Model #: [id number]     Serial #: [id number]     Status: active  Magnet Response Rate:  BOL 100 ERI  85  Indications:  [name]  Explantation Comments:  +Coumadin   PPM Follow Up Remote Check?  No Battery Voltage:  good V     Battery Est. Longevity:  >5 years     Pacer Dependent:  Yes     Right Ventricle  Amplitude: 7.0 mV, Impedance: 550 ohms, Threshold: 1.3 V at 0.4 msec  Episodes Coumadin:  Yes Ventricular High Rate:  18     Ventricular Pacing:  95%  Parameters Mode:  VVIR     Lower Rate Limit:  60     Upper Rate Limit:  120 Next Cardiology Appt Due:  08/12/2009 Tech Comments:  No parameter changes.  18 VHR episodes no EGM's available.  A-fib , + coumadin.  ROV 6months clinic. Altha Harm, LPN  March 11, 2009 3:17 PM  MD Comments:  Agree with above.  Impression & Recommendations:  Problem # 1:  CARDIAC PACEMAKER IN SITU (ICD-V45.01) His device is working normally.  Will recheck in several months.  Problem # 2:  ATHEROSCLEROTIC CARDIOVASCULAR DISEASE (ICD-429.2) He denies anginal symptoms s/p CABG.  Continue current meds.  Will recheck in several months.  Problem # 3:  ATRIAL FIBRILLATION (ICD-427.31) He will remain on coumadin.  His ventricular rate is well controlled. His updated medication list for this problem includes:    Coumadin 5 Mg Tabs (Warfarin sodium) .Marland Kitchen... Take 1 tab daily or as directed by coumadin clinic  Patient Instructions: 1)  Your physician recommends that you schedule a follow-up appointment in: 6 months with device clinic and 12 months with Dr [name] 2)  Dennis Bast, RN, BSN  March 11, 2009 3:11 PM

## 2010-02-11 NOTE — Progress Notes (Signed)
Summary: Change Simvastatin dose  ---- Converted from flag ---- ---- 09/27/2009 5:59 PM, Laren Boom, MD, [facility] wrote: OK to reduce dose to 20 mg daily.  ---- 07/05/2009 8:59 AM, Elaina Pattee wrote: Pt on amlodipine 5 mg daily and simvastatin 40 mg daily. New recommendation for max dose w/ amlodipine is simvastatin 20 mg daily. Consider decreasing dose or changing to alternative statin if need higher potency.  Thanks! ------------------------------  Phone Note Outgoing Call   Call placed by: [name] PharmD,  October 02, 2009 9:10 AM Call placed to: Patient Summary of Call: Pt aware.  Will take 1/2 of his simvastatin 40mg  tablets.  Initial call taken by: [name] PharmD,  October 02, 2009 9:11 AM

## 2010-02-11 NOTE — Medication Information (Signed)
Summary: ccr/ gd  Anticoagulant Therapy  Managed by: Shelby Dubin, PharmD, BCPS, CPP PCP: Dr. Nobie Putnam Supervising MD: Shirlee Latch MD, Freida Busman Indication 1: Atrial Fibrillation (ICD-427.31) Lab Used: [facility] [place] Site: [place] INR POC 1.6 INR RANGE 2 - 3   Dietary changes: no            Allergies: No Known Drug Allergies  Anticoagulation Management History:      The patient is taking warfarin and comes in today for a routine follow up visit.  Positive risk factors for bleeding include an age of 75 years or older.  The bleeding index is 'intermediate risk'.  Positive CHADS2 values include History of HTN.  Negative CHADS2 values include Age > 58 years old.  The start date was 09/25/2004.  Anticoagulation responsible provider: Shirlee Latch MD, [place].  INR POC: 1.6.  Cuvette Lot#: [id number].  Exp: 04/2010.    Anticoagulation Management Assessment/Plan:      The patient's current anticoagulation dose is Coumadin 5 mg tabs: TAKE 1 TAB DAILY OR AS DIRECTED BY COUMADIN CLINIC.  The target INR is 2 - 3.  The next INR is due 03/11/2009.  Anticoagulation instructions were given to patient.  Results were reviewed/authorized by Shelby Dubin, PharmD, BCPS, CPP.  He was notified by Shelby Dubin PharmD, BCPS, CPP.         Prior Anticoagulation Instructions: INR 2.3 Continue coumadin 5mg  once daily except 7.5mg  on Mondays Pt is moving to [place].  He will transfer coumadin management to our [place] office.  Current Anticoagulation Instructions: INR 1.6  Take 2 tabs today, then take 1.5 tabs each Monday and Friday and 1 tab on all other days.    Recheck in 2 weeks.     Appended Document: Coumadin Clinic    Anticoagulant Therapy  Managed by: Shelby Dubin, PharmD, BCPS, CPP PCP: Dr. Nobie Putnam Supervising MD: Shirlee Latch MD, Freida Busman Indication 1: Atrial Fibrillation (ICD-427.31) Lab Used: [facility] [place] Site: [place] INR  RANGE 2 - 3            Allergies: No Known Drug Allergies  Anticoagulation Management History:      Positive risk factors for bleeding include an age of 36 years or older.  The bleeding index is 'intermediate risk'.  Positive CHADS2 values include History of HTN.  Negative CHADS2 values include Age > 82 years old.  The start date was 09/25/2004.  Anticoagulation responsible provider: Shirlee Latch MD, [place].  Exp: 04/2010.    Anticoagulation Management Assessment/Plan:      The patient's current anticoagulation dose is Coumadin 5 mg tabs: TAKE 1 TAB DAILY OR AS DIRECTED BY COUMADIN CLINIC.  The target INR is 2 - 3.  The next INR is due 03/11/2009.  Anticoagulation instructions were given to patient.  Results were reviewed/authorized by Shelby Dubin, PharmD, BCPS, CPP.         Prior Anticoagulation Instructions: INR 1.6  Take 2 tabs today, then take 1.5 tabs each Monday and Friday and 1 tab on all other days.    Recheck in 2 weeks.

## 2010-02-11 NOTE — Cardiovascular Report (Signed)
Summary: Office Visit   Office Visit   Imported By: Roderic Ovens 03/14/2009 14:03:23  _____________________________________________________________________  External Attachment:    Type:   Image     Comment:   External Document

## 2010-02-11 NOTE — Medication Information (Signed)
Summary: rov/tm  Anticoagulant Therapy  Managed by: Bethena Midget, RN, BSN PCP: Dr. Nobie Putnam Supervising MD: Tenny Craw MD, Gunnar Fusi Indication 1: Atrial Fibrillation (ICD-427.31) Lab Used: [facility] [place] Site: [address] INR POC 1.9 INR RANGE 2 - 3   Dietary changes: no    Health status changes: no    Bleeding/hemorrhagic complications: no    Recent/future hospitalizations: no    Any changes in medication regimen? no    Recent/future dental: no  Any missed doses?: no       Is patient compliant with meds? yes       Allergies: No Known Drug Allergies  Anticoagulation Management History:      The patient is taking warfarin and comes in today for a routine follow up visit.  Positive risk factors for bleeding include an age of 76 years or older.  The bleeding index is 'intermediate risk'.  Positive CHADS2 values include History of HTN.  Negative CHADS2 values include Age > 78 years old.  The start date was 09/25/2004.  Anticoagulation responsible provider: Tenny Craw MD, Gunnar Fusi.  INR POC: 1.9.  Cuvette Lot#: [id number].  Exp: 06/2010.    Anticoagulation Management Assessment/Plan:      The patient's current anticoagulation dose is Coumadin 5 mg tabs: TAKE 1 TAB DAILY OR AS DIRECTED BY COUMADIN CLINIC.  The target INR is 2 - 3.  The next INR is due 06/07/2009.  Anticoagulation instructions were given to patient.  Results were reviewed/authorized by Bethena Midget, RN, BSN.  He was notified by Bethena Midget, RN, BSN.         Prior Anticoagulation Instructions: INR 1.9 Today take 2 pills then continue 1 pill everyday except 1.5 pills on Mondays and Fridays. Recheck in 3 weeks.   Current Anticoagulation Instructions: INR 1.9 Today take 2 pills then change dose to 1 pill everyday except 1 1/2 pills on Mondays, Wednesdays and Fridays. Recheck in 2-3 weeks.

## 2010-02-11 NOTE — Letter (Signed)
Summary: Results Follow-up Letter  [facility]  [address] [place], [place] [address]   Phone: [phone]  Fax: [phone]    12/10/2009  [address] [place], [place]  [address]  Dear [name],   The following are the results of your recent test(s):  Test     Result     gout level     high liver/kidney   normal urine       rbc's and wbc's - ? infection thyroid     normal prostate     normal cbc       normal  _________________________________________________________  Please call for an appointment soon _________________________________________________________ _________________________________________________________ _________________________________________________________  Sincerely,  Sanda Linger MD  [facility]

## 2010-02-11 NOTE — Letter (Signed)
Summary: Custom - Delinquent Coumadin 1  [facility]  [address], [place] [address]   Phone: [phone]  Fax: [phone]     February 13, 2009 [name]: [id number]   [name] [address] [place], [place]  [address]   Dear [name],  This letter is being sent to you as a reminder that it is necessary for you to get your INR/PT checked regularly so that we can optimize your care.  Our records indicate that you were scheduled to have a test done recently.  As of today, we have not received the results of this test.  It is very important that you have your INR checked.  Please call our office at the number listed above to schedule an appointment at your earliest convenience.    If you have recently had your protime checked or have discontinued this medication, please contact our office at the above phone number to clarify this issue.  Thank you for this prompt attention to this important health care matter.  Sincerely, Vashti Hey RN  [facility] Team

## 2010-02-11 NOTE — Progress Notes (Signed)
Summary: Refills  Phone Note Call from Patient   Caller: Patient Reason for Call: Talk to Nurse Summary of Call: pt would like to speak to nurse regarding refills/tg Initial call taken by: Raechel Ache [facility],  July 11, 2009 11:19 AM  Follow-up for Phone Call        Refills sent to [facility]. Pt. is aware.    New/Updated Medications: SIMVASTATIN 40 MG TABS (SIMVASTATIN) Take one tablet by mouth daily at bedtime HYDROCHLOROTHIAZIDE 25 MG TABS (HYDROCHLOROTHIAZIDE) Take one tablet by mouth daily. LISINOPRIL 20 MG TABS (LISINOPRIL) Take one tablet by mouth daily KLOR-CON M20 20 MEQ CR-TABS (POTASSIUM CHLORIDE CRYS CR) Take 1 tablet by mouth once a day COUMADIN 5 MG TABS (WARFARIN SODIUM) TAKE 1 TAB DAILY OR AS DIRECTED BY COUMADIN CLINIC AMLODIPINE BESYLATE 5 MG TABS (AMLODIPINE BESYLATE) take 1 tablet daily SULAR 8.5 MG XR24H-TAB (NISOLDIPINE) Take one tablet by mouth once daily. Prescriptions: SULAR 8.5 MG XR24H-TAB (NISOLDIPINE) Take one tablet by mouth once daily.  #90 x 1   Entered by:   Larita Fife Via LPN   Authorized by:   Kathlen Brunswick, MD, [facility]   Signed by:   Larita Fife Via LPN on 60/73/7106   Method used:   Faxed to ...       [facility] Environmental education officer)       [address] [address]       [place], [place]  [address]       Ph: [phone]       Fax: [phone]   RxID:   [id number] AMLODIPINE BESYLATE 5 MG TABS (AMLODIPINE BESYLATE) take 1 tablet daily  #90 x 1   Entered by:   Larita Fife Via LPN   Authorized by:   Kathlen Brunswick, MD, [facility]   Signed by:   Larita Fife Via LPN on 25/85/2778   Method used:   Faxed to ...       [facility] Environmental education officer)       [address] [address]       [place], [place]  [address]       Ph: [phone]       Fax: [phone]   RxID:   [id number] COUMADIN 5 MG TABS (WARFARIN SODIUM) TAKE 1 TAB DAILY OR AS DIRECTED BY COUMADIN CLINIC  #180 x 1   Entered by:   Larita Fife Via LPN   Authorized by:   Kathlen Brunswick, MD, [facility]   Signed by:   Larita Fife Via LPN on 25/05/3974  Method used:   Faxed to ...       [facility] Environmental education officer)       [address] [address]       [place], [place]  [address]       Ph: [phone]       Fax: [phone]   RxID:   [id number] KLOR-CON M20 20 MEQ CR-TABS (POTASSIUM CHLORIDE CRYS CR) Take 1 tablet by mouth once a day  #90 x 1   Entered by:   Larita Fife Via LPN   Authorized by:   Kathlen Brunswick, MD, [facility]   Signed by:   Larita Fife Via LPN on 94/17/4081   Method used:   Faxed to ...       [facility] Environmental education officer)       [address] [address]       [place], [place]  [address]       Ph: [phone]       Fax: [phone]   RxID:   [id number] LISINOPRIL 20 MG TABS (LISINOPRIL) Take one tablet by  mouth daily  #90 x 1   Entered by:   Larita Fife Via LPN   Authorized by:   Kathlen Brunswick, MD, [facility]   Signed by:   Larita Fife Via LPN on 20/25/4270   Method used:   Faxed to ...       [facility] Environmental education officer)       [address] [address]       [place], [place]  [address]       Ph: [phone]       Fax: [phone]   RxID:   [id number] HYDROCHLOROTHIAZIDE 25 MG TABS (HYDROCHLOROTHIAZIDE) Take one tablet by mouth daily.  #90 x 1   Entered by:   Larita Fife Via LPN   Authorized by:   Kathlen Brunswick, MD, [facility]   Signed by:   Larita Fife Via LPN on 82/99/3716   Method used:   Faxed to ...       [facility] Environmental education officer)       [address] [address]       [place], [place]  [address]       Ph: [phone]       Fax: [phone]   RxID:   [id number] SIMVASTATIN 40 MG TABS (SIMVASTATIN) Take one tablet by mouth daily at bedtime  #90 x 1   Entered by:   Larita Fife Via LPN   Authorized by:   Kathlen Brunswick, MD, [facility]   Signed by:   Larita Fife Via LPN on 61/95/0932   Method used:   Faxed to ...       [facility] Environmental education officer)       [address] [address]       [place], [place]  [address]       Ph: [phone]       Fax: [phone]   RxID:   [id number]

## 2010-02-13 NOTE — Progress Notes (Signed)
Summary: rx refill  Phone Note Refill Request Message from:  Patient on December 25, 2009 9:35 AM  Refills Requested: Medication #1:  SIMVASTATIN 40 MG TABS 1/2 by mouth daily  Medication #2:  LISINOPRIL 20 MG TABS Take one tablet by mouth daily  Medication #3:  KLOR-CON M20 20 MEQ CR-TABS Take 1 tablet by mouth once a day  Medication #4:  COUMADIN 5 MG TABS TAKE 1 TAB DAILY OR AS DIRECTED BY COUMADIN CLINIC SIMVASTATIN 40 MG TABS and HYDROCHLOROTHIAZIDE 25 MG TABS. please [facility]. pt states we should have fax number on file   Method Requested: Fax to Local Pharmacy Initial call taken by: Roe Coombs,  December 25, 2009 9:35 AM  Follow-up for Phone Call        RX sent into pharmacy. pt notified . Marrion Coy, CNA  December 25, 2009 11:58 AM  Follow-up by: Marrion Coy, CNA,  December 25, 2009 11:58 AM    Prescriptions: COUMADIN 5 MG TABS (WARFARIN SODIUM) TAKE 1 TAB DAILY OR AS DIRECTED BY COUMADIN CLINIC  #110 x 1   Entered by:   [name] PharmD   Authorized by:   Rollene Rotunda, MD, [facility]   Signed by:   [name] PharmD on 12/25/2009   Method used:   Electronically to        [facility]* (mail-order)       [address]       [place], [place]  [address]       Ph: [phone]       Fax: [phone]   RxID:   [id number] KLOR-CON M20 20 MEQ CR-TABS (POTASSIUM CHLORIDE CRYS CR) Take 1 tablet by mouth once a day  #90 x 2   Entered by:   Marrion Coy, CNA   Authorized by:   Rollene Rotunda, MD, [facility]   Signed by:   Marrion Coy, CNA on 12/25/2009   Method used:   Faxed to ...       [facility] Environmental education officer)       [address] [address]       [place], [place]  [address]       Ph: [phone]       Fax: [phone]   RxID:   [id number] LISINOPRIL 20 MG TABS (LISINOPRIL) Take one tablet by mouth daily  #90 x 2   Entered by:   Marrion Coy, CNA   Authorized by:   Rollene Rotunda, MD, [facility]   Signed by:   Marrion Coy, CNA on  12/25/2009   Method used:   Faxed to ...       [facility] Environmental education officer)       [address] [address]       [place], [place]  [address]       Ph: [phone]       Fax: [phone]   RxID:   [id number] HYDROCHLOROTHIAZIDE 25 MG TABS (HYDROCHLOROTHIAZIDE) Take one tablet by mouth daily.  #90 x 2   Entered by:   Marrion Coy, CNA   Authorized by:   Rollene Rotunda, MD, [facility]   Signed by:   Marrion Coy, CNA on 12/25/2009   Method used:   Faxed to ...       [facility] Environmental education officer)       [address] [address]       [place], [place]  [address]       Ph: [phone]       Fax: [phone]   RxID:   [id number] SIMVASTATIN  40 MG TABS (SIMVASTATIN) 1/2 by mouth daily  #45 x 2   Entered by:   Marrion Coy, CNA   Authorized by:   Rollene Rotunda, MD, [facility]   Signed by:   Marrion Coy, CNA on 12/25/2009   Method used:   Faxed to ...       [facility] Environmental education officer)       [address] [address]       [place], [place]  [address]       Ph: [phone]       Fax: [phone]   RxID:   [id number]

## 2010-02-13 NOTE — Medication Information (Signed)
Summary: rov/tm  Anticoagulant Therapy  Managed by: Bethena Midget, RN, BSN PCP: Etta Grandchild MD Supervising MD: Johney Frame MD, Fayrene Fearing Indication 1: Atrial Fibrillation (ICD-427.31) Lab Used: [facility] [place] Site: [address] INR POC 2.4 INR RANGE 2 - 3   Dietary changes: no    Health status changes: no    Bleeding/hemorrhagic complications: no    Recent/future hospitalizations: no    Any changes in medication regimen? no    Recent/future dental: no  Any missed doses?: no       Is patient compliant with meds? yes       Allergies: No Known Drug Allergies  Anticoagulation Management History:      The patient is taking warfarin and comes in today for a routine follow up visit.  Positive risk factors for bleeding include an age of 75 years or older.  Negative risk factors for bleeding include no history of CVA/TIA.  The bleeding index is 'intermediate risk'.  Positive CHADS2 values include History of HTN and Age > 102 years old.  Negative CHADS2 values include Prior Stroke/CVA/TIA.  The start date was 09/25/2004.  His last INR was 10.4 ratio.  Anticoagulation responsible provider: Elisabetta Mishra MD, Fayrene Fearing.  INR POC: 2.4.  Cuvette Lot#: [id number].  Exp: 02/2011.    Anticoagulation Management Assessment/Plan:      The patient's current anticoagulation dose is Coumadin 5 mg tabs: TAKE 1 TAB DAILY OR AS DIRECTED BY COUMADIN CLINIC.  The target INR is 2 - 3.  The next INR is due 01/29/2010.  Anticoagulation instructions were given to patient.  Results were reviewed/authorized by Bethena Midget, RN, BSN.  He was notified by Stephannie Peters, PharmD Candidate .         Prior Anticoagulation Instructions: INR 1.1 Today take 2 pills then resume 1 pill everyday except 1.5 pills on Mondays, Wednesdays and Fridays. Recheck in one week.   Current Anticoagulation Instructions: INR 2.4  Coumadin 5 mg tablets - Take 1 tablet every day except 1.5 tablets on Mondays, Wednesdays and Fridays.

## 2010-02-13 NOTE — Assessment & Plan Note (Signed)
Summary: 2 wk f/u per dr jones/#/cd   Vital Signs:  Patient profile:   77 year old male Height:      74 inches Weight:      188.25 pounds BMI:     24.26 O2 Sat:      97 % on Room air Temp:     97.8 degrees F oral Pulse rate:   80 / minute Pulse rhythm:   regular Resp:     16 per minute BP sitting:   130 / 72  (left arm) Cuff size:   large  Vitals Entered By: Rock Nephew CMA (December 26, 2009 10:16 AM)  O2 Flow:  Room air  Primary Care Provider:  Etta Grandchild MD   History of Present Illness: He returns for f/up and he wants me to refer him for f/up care with Dr. Venetia Maxon for ongoing issues with his neck and back - he has no new or worsening symptoms.  He still has urinary symptoms and his UA was abnormal with rbc's and wbc's.  He wants to treat his gout to prevent any recurrence.  Preventive Screening-Counseling & Management  Alcohol-Tobacco     Alcohol drinks/day: 0     Alcohol Counseling: not indicated; patient does not drink     Smoking Status: quit     Tobacco Counseling: to remain off tobacco products  Hep-HIV-STD-Contraception     Hepatitis Risk: no risk noted     HIV Risk: no risk noted     STD Risk: no risk noted     Dental Visit-last 6 months yes     Dental Care Counseling: to seek dental care; no dental care within six months     TSE monthly: yes     Testicular SE Education/Counseling to perform regular STE     Sun Exposure-Excessive: yes     Sun Exposure Counseling: to decrease sun exposure      Sexual History:  currently monogamous.        Drug Use:  never.        Blood Transfusions:  yes and prior to 2001.    Clinical Review Panels:  Prevention   Last Colonoscopy:  normal (01/12/2009)   Last PSA:  1.07 (12/10/2009)  Immunizations   Last Tetanus Booster:  Historical (01/13/2004)  Lipid Management   Cholesterol:  200 (12/10/2009)   LDL (bad choesterol):  113 (12/10/2009)   HDL (good cholesterol):  56.10 (12/10/2009)  Diabetes  Management   Creatinine:  1.0 (12/10/2009)  CBC   WBC:  6.3 (12/10/2009)   RBC:  4.69 (12/10/2009)   Hgb:  15.3 (12/10/2009)   Hct:  44.2 (12/10/2009)   Platelets:  218.0 (12/10/2009)   MCV  94.1 (12/10/2009)   MCHC  34.7 (12/10/2009)   RDW  13.7 (12/10/2009)   PMN:  69.5 (12/10/2009)   Lymphs:  18.6 (12/10/2009)   Monos:  10.4 (12/10/2009)   Eosinophils:  1.2 (12/10/2009)   Basophil:  0.3 (12/10/2009)  Complete Metabolic Panel   Glucose:  117 (12/10/2009)   Sodium:  139 (12/10/2009)   Potassium:  3.5 (12/10/2009)   Chloride:  99 (12/10/2009)   CO2:  29 (12/10/2009)   BUN:  26 (12/10/2009)   Creatinine:  1.0 (12/10/2009)   Albumin:  4.6 (12/10/2009)   Total Protein:  7.6 (12/10/2009)   Calcium:  9.8 (12/10/2009)   Total Bili:  0.8 (12/10/2009)   Alk Phos:  59 (12/10/2009)   SGPT (ALT):  20 (12/10/2009)   SGOT (AST):  31 (  12/10/2009)   Medications Prior to Update: 1)  Nitroglycerin 0.4 Mg Subl (Nitroglycerin) .... One Tablet Under Tongue Every 5 Minutes As Needed For Chest Pain---May Repeat Times Three 2)  Simvastatin 40 Mg Tabs (Simvastatin) .... 1/2 By Mouth Daily 3)  Hydrochlorothiazide 25 Mg Tabs (Hydrochlorothiazide) .... Take One Tablet By Mouth Daily. 4)  Lisinopril 20 Mg Tabs (Lisinopril) .... Take One Tablet By Mouth Daily 5)  Klor-Con M20 20 Meq Cr-Tabs (Potassium Chloride Crys Cr) .... Take 1 Tablet By Mouth Once A Day 6)  Coumadin 5 Mg Tabs (Warfarin Sodium) .... Take 1 Tab Daily or As Directed By Coumadin Clinic 7)  Viagra 50 Mg Tabs (Sildenafil Citrate) .... Take 1 Tablet By Mouth As Directed 8)  Amlodipine Besylate 10 Mg Tabs (Amlodipine Besylate) .... Take One Tablet By Mouth Daily  Current Medications (verified): 1)  Nitroglycerin 0.4 Mg Subl (Nitroglycerin) .... One Tablet Under Tongue Every 5 Minutes As Needed For Chest Pain---May Repeat Times Three 2)  Simvastatin 40 Mg Tabs (Simvastatin) .... 1/2 By Mouth Daily 3)  Hydrochlorothiazide 25 Mg Tabs  (Hydrochlorothiazide) .... Take One Tablet By Mouth Daily. 4)  Lisinopril 20 Mg Tabs (Lisinopril) .... Take One Tablet By Mouth Daily 5)  Klor-Con M20 20 Meq Cr-Tabs (Potassium Chloride Crys Cr) .... Take 1 Tablet By Mouth Once A Day 6)  Coumadin 5 Mg Tabs (Warfarin Sodium) .... Take 1 Tab Daily or As Directed By Coumadin Clinic 7)  Viagra 50 Mg Tabs (Sildenafil Citrate) .... Take 1 Tablet By Mouth As Directed 8)  Amlodipine Besylate 10 Mg Tabs (Amlodipine Besylate) .... Take One Tablet By Mouth Daily 9)  Sulfamethoxazole-Tmp Ds 800-160 Mg Tab (Trimethoprim-Sulfamethoxazole) .... Take 1 Tablet By Mouth Morning and Night 10)  Allopurinol 100 Mg Tabs (Allopurinol) .... One By Mouth Once Daily To Prevent Gout  Allergies (verified): No Known Drug Allergies  Past History:  Past Medical History: Last updated: 12/10/2009 ASCVD: Coronary artery bypass graft surgery in 1983; patent grafts on coronary angiography in 7/97 with     normal ejection fraction; total obstruction of the vein graft to the second marginal in 2004; stress nuclear and     echo in 2006: Inferior scar; basilar inferior and posterior akinesis; ejection fraction of 45%; adenosine stress     nuclear study in 05/2008: Low normal left ventricular systolic function; severe hypokinesis of the basilar and mid   inferior and inferoseptal segments; inferior, inferolateral and inferoapical scarring without ischemia Cerebrovascular disease: Right carotid bruit; minimal plaque on ultrasound in 01/2004 Sick sinus syndrome: Sinus bradycardia and atrial fibrillation; dual-chamber Guidant pacemaker in 08/2004 Chronic anticoagulation Tobacco abuse: 20 pack years; discontinued in 1983. HYPERLIPIDEMIA (ICD-272.4) GOUT, UNSPECIFIED (ICD-274.9) HYPERTENSION (ICD-401.9) VERTIGO (ICD-780.4) Osteoarthritis  Past Surgical History: Last updated: 11/21/2008 S/P TURP S/P LS SPINE FUSION (2003) in 2006; prior cervical spine procedure with implantation of  hardware Cholecystectomy CABG TIMES 3 (1983) APPENDECTOMY 2 KNEE SURGERYS (LEFT ) Guidant pacemaker implant-08/2004  Family History: Last updated: Jun 22, 2008 Father:DECEASED DUE TO UNKNOWN CAUSE Mother:ALIVE AND WELL IN HER 90'S  Social History: Last updated: 12/10/2009 Retired [employer]PART[employer] Divorced  Tobacco Use - Former.  Alcohol Use - no Regular Exercise - yes Drug Use - no reMarried  Risk Factors: Alcohol Use: 0 (12/26/2009) Exercise: yes (06-22-2008)  Risk Factors: Smoking Status: quit (12/26/2009)  Family History: Reviewed history from June 22, 2008 and no changes required. Father:DECEASED DUE TO UNKNOWN CAUSE Mother:ALIVE AND WELL IN HER 90'S  Social History: Reviewed history from 12/10/2009 and no changes  required. Retired [employer] Divorced  Tobacco Use - Former.  Alcohol Use - no Regular Exercise - yes Drug Use - no reMarried  Review of Systems  The patient denies anorexia, fever, weight loss, weight gain, chest pain, syncope, dyspnea on exertion, peripheral edema, prolonged cough, headaches, hemoptysis, abdominal pain, hematuria, abnormal bleeding, and enlarged lymph nodes.   General:  Denies chills, fatigue, fever, loss of appetite, malaise, sleep disorder, and sweats. GU:  Complains of nocturia, urinary frequency, and urinary hesitancy; denies discharge, dysuria, hematuria, and incontinence.  Physical Exam  General:  alert, well-developed, well-nourished, well-hydrated, appropriate dress, normal appearance, healthy-appearing, cooperative to examination, and good hygiene.   Head:  normocephalic and atraumatic.   Mouth:  Oral mucosa and oropharynx without lesions or exudates.  Teeth in good repair. Neck:  Neck supple, no JVD. No masses, thyromegaly or abnormal cervical nodes. Lungs:  normal respiratory effort, no intercostal retractions, no accessory muscle use, normal breath sounds, no dullness, no fremitus, no crackles, and no  wheezes.   Heart:  normal rate, regular rhythm, no murmur, no gallop, no rub, and no JVD.   Abdomen:  soft, non-tender, normal bowel sounds, no distention, no masses, no guarding, no rigidity, no rebound tenderness, no abdominal hernia, no inguinal hernia, no hepatomegaly, no splenomegaly, and abdominal scar(s).   Genitalia:  uncircumcised, no hydrocele, no varicocele, no scrotal masses, no testicular masses or atrophy, no cutaneous lesions, and no urethral discharge.   Prostate:  no nodules, no asymmetry, no induration, and 1+ enlarged.   Msk:  normal ROM, no joint tenderness, no joint swelling, no joint warmth, no redness over joints, no joint deformities, no joint instability, no crepitation, and no muscle atrophy.   Extremities:  No clubbing, cyanosis, edema, or deformity noted with normal full range of motion of all joints.   Neurologic:  No cranial nerve deficits noted. Station and gait are normal. Plantar reflexes are down-going bilaterally. DTRs are symmetrical throughout. Sensory, motor and coordinative functions appear intact. Skin:  turgor normal, no rashes, no suspicious lesions, no ecchymoses, no petechiae, no purpura, no ulcerations, no edema, excessive tan, and solar damage.   Cervical Nodes:  no anterior cervical adenopathy and no posterior cervical adenopathy.   Psych:  Oriented X3, normally interactive, good eye contact, not anxious appearing, not depressed appearing, not agitated, not suicidal, easily distracted, poor concentration, and memory impairment.     Impression & Recommendations:  Problem # 1:  PROSTATITIS, ACUTE (ICD-601.0) Assessment New start bactrim  Problem # 2:  DEGENERATIVE DISC DISEASE, CERVICAL SPINE (ICD-722.4) Assessment: New  Orders: Neurosurgeon Referral (Neurosurgeon)  Problem # 3:  GOUT, UNSPECIFIED (ICD-274.9) Assessment: Deteriorated  His updated medication list for this problem includes:    Allopurinol 100 Mg Tabs (Allopurinol) ..... One by  mouth once daily to prevent gout  Complete Medication List: 1)  Nitroglycerin 0.4 Mg Subl (Nitroglycerin) .... One tablet under tongue every 5 minutes as needed for chest pain---may repeat times three 2)  Simvastatin 40 Mg Tabs (Simvastatin) .... 1/2 by mouth daily 3)  Hydrochlorothiazide 25 Mg Tabs (Hydrochlorothiazide) .... Take one tablet by mouth daily. 4)  Lisinopril 20 Mg Tabs (Lisinopril) .... Take one tablet by mouth daily 5)  Klor-con M20 20 Meq Cr-tabs (Potassium chloride crys cr) .... Take 1 tablet by mouth once a day 6)  Coumadin 5 Mg Tabs (Warfarin sodium) .... Take 1 tab daily or as directed by coumadin clinic 7)  Viagra 50 Mg Tabs (Sildenafil citrate) .... Take 1 tablet  by mouth as directed 8)  Amlodipine Besylate 10 Mg Tabs (Amlodipine besylate) .... Take one tablet by mouth daily 9)  Sulfamethoxazole-tmp Ds 800-160 Mg Tab (Trimethoprim-sulfamethoxazole) .... Take 1 tablet by mouth morning and night 10)  Allopurinol 100 Mg Tabs (Allopurinol) .... One by mouth once daily to prevent gout  Patient Instructions: 1)  Please schedule a follow-up appointment in 1 month. 2)  To prevent gout attacks,avoid purine rich foods, such as beer, beans & peas, and meat gravies. 3)  Take your antibiotic as prescribed until ALL of it is gone, but stop if you develop a rash or swelling and contact our office as soon as possible. 4)  Please keep your appointment next week for the coumadin check. Prescriptions: ALLOPURINOL 100 MG TABS (ALLOPURINOL) one by mouth once daily to prevent gout  #30 x 11   Entered and Authorized by:   Etta Grandchild MD   Signed by:   Etta Grandchild MD on 12/26/2009   Method used:   Electronically to        [facility]. [phone]* (retail)       [address]       [place], [place]  [address]       Ph: [phone]       Fax: [phone]   RxID:   [id number] SULFAMETHOXAZOLE-TMP DS 800-160 MG TAB (TRIMETHOPRIM-SULFAMETHOXAZOLE) Take  1 tablet by mouth morning and night  #60 x 1   Entered and Authorized by:   Etta Grandchild MD   Signed by:   Etta Grandchild MD on 12/26/2009   Method used:   Electronically to        [facility]. [phone]* (retail)       [address]       [place], [place]  [address]       Ph: [phone]       Fax: [phone]   RxID:   [id number]    Orders Added: 1)  Neurosurgeon Referral [Neurosurgeon] 2)  Est. Patient Level IV [70623]

## 2010-02-13 NOTE — Assessment & Plan Note (Signed)
Summary: 1 mos f/u #/cd   Vital Signs:  Patient profile:   77 year old male Height:      74 inches Weight:      187 pounds O2 Sat:      96 % on Room air Temp:     97.8 degrees F Pulse rate:   79 / minute Pulse rhythm:   irregular Resp:     16 per minute BP sitting:   130 / 82  (left arm) Cuff size:   large  Vitals Entered By: Rock Nephew CMA (January 24, 2010 9:57 AM)  O2 Flow:  Room air CC: follow-up visit 39mo Is Patient Diabetic? No Pain Assessment Patient in pain? no       Does patient need assistance? Functional Status Self care Ambulation Normal   Primary Care Provider:  Etta Grandchild MD  CC:  follow-up visit 39mo.  History of Present Illness: He returns for f/up on his prostate gland infection, in general he feels better but he still has urinary hesitancy, frequency and nocturia.  Preventive Screening-Counseling & Management  Alcohol-Tobacco     Alcohol drinks/day: 0     Alcohol Counseling: not indicated; patient does not drink     Smoking Status: quit     Tobacco Counseling: to remain off tobacco products  Hep-HIV-STD-Contraception     Hepatitis Risk: no risk noted     HIV Risk: no risk noted     STD Risk: no risk noted     Dental Visit-last 6 months yes     Dental Care Counseling: to seek dental care; no dental care within six months     TSE monthly: yes     Testicular SE Education/Counseling to perform regular STE     Sun Exposure-Excessive: yes     Sun Exposure Counseling: to decrease sun exposure      Sexual History:  currently monogamous.        Drug Use:  never.        Blood Transfusions:  yes and prior to 2001.    Clinical Review Panels:  Prevention   Last Colonoscopy:  normal (01/12/2009)   Last PSA:  1.07 (12/10/2009)  Immunizations   Last Tetanus Booster:  Historical (01/13/2004)  Lipid Management   Cholesterol:  200 (12/10/2009)   LDL (bad choesterol):  113 (12/10/2009)   HDL (good cholesterol):  56.10  (12/10/2009)  Diabetes Management   Creatinine:  1.0 (12/10/2009)  CBC   WBC:  6.3 (12/10/2009)   RBC:  4.69 (12/10/2009)   Hgb:  15.3 (12/10/2009)   Hct:  44.2 (12/10/2009)   Platelets:  218.0 (12/10/2009)   MCV  94.1 (12/10/2009)   MCHC  34.7 (12/10/2009)   RDW  13.7 (12/10/2009)   PMN:  69.5 (12/10/2009)   Lymphs:  18.6 (12/10/2009)   Monos:  10.4 (12/10/2009)   Eosinophils:  1.2 (12/10/2009)   Basophil:  0.3 (12/10/2009)  Complete Metabolic Panel   Glucose:  117 (12/10/2009)   Sodium:  139 (12/10/2009)   Potassium:  3.5 (12/10/2009)   Chloride:  99 (12/10/2009)   CO2:  29 (12/10/2009)   BUN:  26 (12/10/2009)   Creatinine:  1.0 (12/10/2009)   Albumin:  4.6 (12/10/2009)   Total Protein:  7.6 (12/10/2009)   Calcium:  9.8 (12/10/2009)   Total Bili:  0.8 (12/10/2009)   Alk Phos:  59 (12/10/2009)   SGPT (ALT):  20 (12/10/2009)   SGOT (AST):  31 (12/10/2009)   Medications Prior to Update: 1)  Nitroglycerin 0.4 Mg Subl (Nitroglycerin) .... One Tablet Under Tongue Every 5 Minutes As Needed For Chest Pain---May Repeat Times Three 2)  Simvastatin 40 Mg Tabs (Simvastatin) .... 1/2 By Mouth Daily 3)  Hydrochlorothiazide 25 Mg Tabs (Hydrochlorothiazide) .... Take One Tablet By Mouth Daily. 4)  Lisinopril 20 Mg Tabs (Lisinopril) .... Take One Tablet By Mouth Daily 5)  Klor-Con M20 20 Meq Cr-Tabs (Potassium Chloride Crys Cr) .... Take 1 Tablet By Mouth Once A Day 6)  Coumadin 5 Mg Tabs (Warfarin Sodium) .... Take 1 Tab Daily or As Directed By Coumadin Clinic 7)  Viagra 50 Mg Tabs (Sildenafil Citrate) .... Take 1 Tablet By Mouth As Directed 8)  Amlodipine Besylate 10 Mg Tabs (Amlodipine Besylate) .... Take One Tablet By Mouth Daily 9)  Sulfamethoxazole-Tmp Ds 800-160 Mg Tab (Trimethoprim-Sulfamethoxazole) .... Take 1 Tablet By Mouth Morning and Night 10)  Allopurinol 100 Mg Tabs (Allopurinol) .... One By Mouth Once Daily To Prevent Gout  Current Medications (verified): 1)   Nitroglycerin 0.4 Mg Subl (Nitroglycerin) .... One Tablet Under Tongue Every 5 Minutes As Needed For Chest Pain---May Repeat Times Three 2)  Simvastatin 40 Mg Tabs (Simvastatin) .... 1/2 By Mouth Daily 3)  Hydrochlorothiazide 25 Mg Tabs (Hydrochlorothiazide) .... Take One Tablet By Mouth Daily. 4)  Lisinopril 20 Mg Tabs (Lisinopril) .... Take One Tablet By Mouth Daily 5)  Klor-Con M20 20 Meq Cr-Tabs (Potassium Chloride Crys Cr) .... Take 1 Tablet By Mouth Once A Day 6)  Coumadin 5 Mg Tabs (Warfarin Sodium) .... Take 1 Tab Daily or As Directed By Coumadin Clinic 7)  Viagra 50 Mg Tabs (Sildenafil Citrate) .... Take 1 Tablet By Mouth As Directed 8)  Amlodipine Besylate 10 Mg Tabs (Amlodipine Besylate) .... Take One Tablet By Mouth Daily 9)  Sulfamethoxazole-Tmp Ds 800-160 Mg Tab (Trimethoprim-Sulfamethoxazole) .... Take 1 Tablet By Mouth Morning and Night 10)  Allopurinol 100 Mg Tabs (Allopurinol) .... One By Mouth Once Daily To Prevent Gout 11)  Rapaflo 8 Mg Caps (Silodosin) .... One By Mouth Once Daily To Help Urine Flow  Allergies (verified): No Known Drug Allergies  Past History:  Past Medical History: Last updated: 12/10/2009 ASCVD: Coronary artery bypass graft surgery in 1983; patent grafts on coronary angiography in 7/97 with     normal ejection fraction; total obstruction of the vein graft to the second marginal in 2004; stress nuclear and     echo in 2006: Inferior scar; basilar inferior and posterior akinesis; ejection fraction of 45%; adenosine stress     nuclear study in 05/2008: Low normal left ventricular systolic function; severe hypokinesis of the basilar and mid   inferior and inferoseptal segments; inferior, inferolateral and inferoapical scarring without ischemia Cerebrovascular disease: Right carotid bruit; minimal plaque on ultrasound in 01/2004 Sick sinus syndrome: Sinus bradycardia and atrial fibrillation; dual-chamber Guidant pacemaker in 08/2004 Chronic  anticoagulation Tobacco abuse: 20 pack years; discontinued in 1983. HYPERLIPIDEMIA (ICD-272.4) GOUT, UNSPECIFIED (ICD-274.9) HYPERTENSION (ICD-401.9) VERTIGO (ICD-780.4) Osteoarthritis  Past Surgical History: Last updated: 11/21/2008 S/P TURP S/P LS SPINE FUSION (2003) in 2006; prior cervical spine procedure with implantation of hardware Cholecystectomy CABG TIMES 3 (1983) APPENDECTOMY 2 KNEE SURGERYS (LEFT ) Guidant pacemaker implant-08/2004  Family History: Last updated: Jun 24, 2008 Father:DECEASED DUE TO UNKNOWN CAUSE Mother:ALIVE AND WELL IN HER 90'S  Social History: Last updated: 12/10/2009 Retired [employer] DEPARTMENT Divorced  Tobacco Use - Former.  Alcohol Use - no Regular Exercise - yes Drug Use - no reMarried  Risk Factors: Alcohol Use: 0 (01/24/2010) Exercise:  yes (06/05/2008)  Risk Factors: Smoking Status: quit (01/24/2010)  Family History: Reviewed history from 06/05/2008 and no changes required. Father:DECEASED DUE TO UNKNOWN CAUSE Mother:ALIVE AND WELL IN HER 90'S  Social History: Reviewed history from 12/10/2009 and no changes required. Retired [employer] DEPARTMENT Divorced  Tobacco Use - Former.  Alcohol Use - no Regular Exercise - yes Drug Use - no reMarried  Review of Systems  The patient denies anorexia, fever, weight loss, weight gain, chest pain, syncope, dyspnea on exertion, peripheral edema, prolonged cough, headaches, hemoptysis, abdominal pain, hematuria, suspicious skin lesions, enlarged lymph nodes, and angioedema.   GU:  Complains of nocturia, urinary frequency, and urinary hesitancy; denies discharge, dysuria, erectile dysfunction, hematuria, and incontinence. MS:  Denies joint pain, joint redness, joint swelling, loss of strength, muscle aches, cramps, muscle weakness, and stiffness.  Physical Exam  General:  alert, well-developed, well-nourished, well-hydrated, appropriate dress, normal appearance, healthy-appearing,  cooperative to examination, and good hygiene.   Head:  normocephalic and atraumatic.   Mouth:  Oral mucosa and oropharynx without lesions or exudates.  Teeth in good repair. Neck:  Neck supple, no JVD. No masses, thyromegaly or abnormal cervical nodes. Lungs:  normal respiratory effort, no intercostal retractions, no accessory muscle use, normal breath sounds, no dullness, no fremitus, no crackles, and no wheezes.   Heart:  normal rate, regular rhythm, no murmur, no gallop, no rub, and no JVD.   Abdomen:  soft, non-tender, normal bowel sounds, no distention, no masses, no guarding, no rigidity, no rebound tenderness, no abdominal hernia, no inguinal hernia, no hepatomegaly, no splenomegaly, and abdominal scar(s).   Genitalia:  uncircumcised, no hydrocele, no varicocele, no scrotal masses, no testicular masses or atrophy, no cutaneous lesions, and no urethral discharge.   Prostate:  no nodules, no asymmetry, no induration, and 1+ enlarged.   Msk:  normal ROM, no joint tenderness, no joint swelling, no joint warmth, no redness over joints, no joint deformities, no joint instability, no crepitation, and no muscle atrophy.   Extremities:  No clubbing, cyanosis, edema, or deformity noted with normal full range of motion of all joints.   Neurologic:  No cranial nerve deficits noted. Station and gait are normal. Plantar reflexes are down-going bilaterally. DTRs are symmetrical throughout. Sensory, motor and coordinative functions appear intact. Skin:  turgor normal, no rashes, no suspicious lesions, no ecchymoses, no petechiae, no purpura, no ulcerations, no edema, excessive tan, and solar damage.   Cervical Nodes:  no anterior cervical adenopathy and no posterior cervical adenopathy.   Inguinal Nodes:  no R inguinal adenopathy and no L inguinal adenopathy.   Psych:  Oriented X3, normally interactive, good eye contact, not anxious appearing, not depressed appearing, not agitated, not suicidal, easily  distracted, poor concentration, and memory impairment.     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 One by mouth once daily to help urine flow  Problem # 3:  HYPERTENSION (ICD-401.9) Assessment: Improved  His updated medication list for this problem includes:    Hydrochlorothiazide 25 Mg Tabs (Hydrochlorothiazide) .Marland Kitchen... Take one tablet by mouth daily.    Lisinopril 20 Mg Tabs (Lisinopril) .Marland Kitchen... Take one tablet by mouth daily    Amlodipine Besylate 10 Mg Tabs (Amlodipine besylate) .Marland Kitchen... Take one tablet by  mouth daily  BP today: 130/82 Prior BP: 130/72 (12/26/2009)  Prior 10 Yr Risk Heart Disease: N/A (12/10/2009)  Labs Reviewed: K+: 3.5 (12/10/2009) Creat: : 1.0 (12/10/2009)   Chol: 200 (12/10/2009)   HDL: 56.10 (12/10/2009)   LDL: 113 (12/10/2009)   TG: 155.0 (12/10/2009)  Problem # 4:  GOUT, UNSPECIFIED (ICD-274.9) Assessment: Unchanged  His updated medication list for this problem includes:    Allopurinol 100 Mg Tabs (Allopurinol) ..... One by mouth once daily to prevent gout  Complete Medication List: 1)  Nitroglycerin 0.4 Mg Subl (Nitroglycerin) .... One tablet under tongue every 5 minutes as needed for chest pain---may repeat times three 2)  Simvastatin 40 Mg Tabs (Simvastatin) .... 1/2 by mouth daily 3)  Hydrochlorothiazide 25 Mg Tabs (Hydrochlorothiazide) .... Take one tablet by mouth daily. 4)  Lisinopril 20 Mg Tabs (Lisinopril) .... Take one tablet by mouth daily 5)  Klor-con M20 20 Meq Cr-tabs (Potassium chloride crys cr) .... Take 1 tablet by mouth once a day 6)  Coumadin 5 Mg Tabs (Warfarin sodium) .... Take 1 tab daily or as directed by coumadin clinic 7)  Viagra 50 Mg Tabs (Sildenafil  citrate) .... Take 1 tablet by mouth as directed 8)  Amlodipine Besylate 10 Mg Tabs (Amlodipine besylate) .... Take one tablet by mouth daily 9)  Sulfamethoxazole-tmp Ds 800-160 Mg Tab (Trimethoprim-sulfamethoxazole) .... Take 1 tablet by mouth morning and night 10)  Allopurinol 100 Mg Tabs (Allopurinol) .... One by mouth once daily to prevent gout 11)  Rapaflo 8 Mg Caps (Silodosin) .... One by mouth once daily to help urine flow  Patient Instructions: 1)  Please schedule a follow-up appointment in 2 months. 2)  It is important that you exercise regularly at least 20 minutes 5 times a week. If you develop chest pain, have severe difficulty breathing, or feel very tired , stop exercising immediately and seek medical attention. 3)  Check your Blood Pressure regularly. If it is above 130/80: you should make an appointment. Prescriptions: RAPAFLO 8 MG CAPS (SILODOSIN) One by mouth once daily to help urine flow  #77 x 0   Entered and Authorized by:   Etta Grandchild MD   Signed by:   Etta Grandchild MD on 01/24/2010   Method used:   Samples Given   RxID:   [id number]    Orders Added: 1)  Est. Patient Level IV [14782]    Prevention & Chronic Care Immunizations   Influenza vaccine: Not documented   Influenza vaccine deferral: Refused  (12/10/2009)    Tetanus booster: 01/13/2004: Historical    Pneumococcal vaccine: Not documented   Pneumococcal vaccine deferral: Refused  (12/10/2009)    H. zoster vaccine: Not documented   H. zoster vaccine deferral: Refused  (12/10/2009)  Colorectal Screening   Hemoccult: Not documented   Hemoccult action/deferral: NEG X 1 today  (12/10/2009)    Colonoscopy: normal  (01/12/2009)  Other Screening   PSA: 1.07  (12/10/2009)   PSA action/deferral: PSA ordered  (12/10/2009)   Smoking status: quit  (01/24/2010)  Lipids   Total Cholesterol: 200  (12/10/2009)   LDL: 113  (12/10/2009)   LDL Direct: Not documented   HDL: 56.10   (12/10/2009)   Triglycerides: 155.0  (12/10/2009)    SGOT (AST): 31  (12/10/2009)   SGPT (ALT): 20  (12/10/2009)   Alkaline phosphatase: 59  (12/10/2009)   Total bilirubin: 0.8  (12/10/2009)  Hypertension   Last Blood Pressure: 130 / 82  (01/24/2010)   Serum creatinine:  1.0  (12/10/2009)   Serum potassium 3.5  (12/10/2009)  Self-Management Support :    Hypertension self-management support: Not documented    Lipid self-management support: Not documented

## 2010-02-13 NOTE — Progress Notes (Signed)
Summary: rx refill  Phone Note Refill Request Message from:  Patient on January 08, 2010 12:17 PM  Refills Requested: Medication #1:  KLOR-CON M20 20 MEQ CR-TABS Take 1 tablet by mouth once a day  Medication #2:  COUMADIN 5 MG TABS TAKE 1 TAB DAILY OR AS DIRECTED BY COUMADIN CLINIC  Medication #3:  AMLODIPINE BESYLATE 10 MG TABS Take one tablet by mouth daily pt also needs liptior 20mg  [facility]   Method Requested: Fax to Local Pharmacy Initial call taken by: Roe Coombs,  January 08, 2010 12:18 PM  Follow-up for Phone Call        RX sent into pharmacy. Pt notified.  Marrion Coy, CNA  January 08, 2010 2:05 PM  Follow-up by: Marrion Coy, CNA,  January 08, 2010 2:05 PM    Prescriptions: COUMADIN 5 MG TABS (WARFARIN SODIUM) TAKE 1 TAB DAILY OR AS DIRECTED BY COUMADIN CLINIC  #110 x 1   Entered by:   [name] PharmD   Authorized by:   Rollene Rotunda, MD, [facility]   Signed by:   [name] PharmD on 01/08/2010   Method used:   Electronically to        [facility]* (mail-order)       [address]       [place], [place]  [address]       Ph: [phone]       Fax: [phone]   RxID:   [id number] AMLODIPINE BESYLATE 10 MG TABS (AMLODIPINE BESYLATE) Take one tablet by mouth daily  #90 x 2   Entered by:   Marrion Coy, CNA   Authorized by:   Rollene Rotunda, MD, [facility]   Signed by:   Marrion Coy, CNA on 01/08/2010   Method used:   Faxed to ...       [facility])       [address] [address]       [place], [place]  [address]       Ph: [phone]       Fax: [phone]   RxID:   [id number] KLOR-CON M20 20 MEQ CR-TABS (POTASSIUM CHLORIDE CRYS CR) Take 1 tablet by mouth once a day  #90 x 2   Entered by:   Marrion Coy, CNA   Authorized by:   Rollene Rotunda, MD, [facility]   Signed by:   Marrion Coy, CNA on 01/08/2010   Method used:   Faxed to ...       [facility] education [facility])       [address] [address]  [place], [place]  [address]       Ph: [phone]       Fax: [phone]   RxID:   [id number]

## 2010-02-13 NOTE — Medication Information (Signed)
Summary: rov/sp  Anticoagulant Therapy  Managed by: Cloyde Reams, RN, BSN PCP: Etta Grandchild MD Supervising MD: [name] MD, Sharlot Gowda Indication 1: Atrial Fibrillation (ICD-427.31) Lab Used: [facility] [place] Site: [address] INR POC 2.7 INR RANGE 2 - 3   Dietary changes: no    Health status changes: no    Bleeding/hemorrhagic complications: no    Recent/future hospitalizations: no    Any changes in medication regimen? no    Recent/future dental: no  Any missed doses?: no       Is patient compliant with meds? yes       Allergies: No Known Drug Allergies  Anticoagulation Management History:      The patient is taking warfarin and comes in today for a routine follow up visit.  Positive risk factors for bleeding include an age of 75 years or older.  Negative risk factors for bleeding include no history of CVA/TIA.  The bleeding index is 'intermediate risk'.  Positive CHADS2 values include History of HTN and Age > 9 years old.  Negative CHADS2 values include Prior Stroke/CVA/TIA.  The start date was 09/25/2004.  His last INR was 10.4 ratio.  Anticoagulation responsible provider: [name] MD, Sharlot Gowda.  INR POC: 2.7.  Cuvette Lot#: [id number].  Exp: 01/2011.    Anticoagulation Management Assessment/Plan:      The patient's current anticoagulation dose is Coumadin 5 mg tabs: TAKE 1 TAB DAILY OR AS DIRECTED BY COUMADIN CLINIC.  The target INR is 2 - 3.  The next INR is due 02/18/2010.  Anticoagulation instructions were given to patient.  Results were reviewed/authorized by Cloyde Reams, RN, BSN.  He was notified by Cloyde Reams RN.         Prior Anticoagulation Instructions: INR 2.4  Coumadin 5 mg tablets - Take 1 tablet every day except 1.5 tablets on Mondays, Wednesdays and Fridays.   Current Anticoagulation Instructions: INR 2.7  Continue on same dosage 1 tablet daily except 1.5 tablets on Mondays, Wednesdays, and Fridays.  Recheck in 2 weeks.

## 2010-02-13 NOTE — Medication Information (Signed)
Summary: rov/sp  Anticoagulant Therapy  Managed by: Bethena Midget, RN, BSN PCP: Etta Grandchild MD Supervising MD: Riley Kill MD, Maisie Fus Indication 1: Atrial Fibrillation (ICD-427.31) Lab Used: [facility] [place] Site: [address] PT 111.6 INR POC 8.0 INR RANGE 2 - 3   Dietary changes: yes       Details: extra alcohol over Christmas  Health status changes: no    Bleeding/hemorrhagic complications: no    Recent/future hospitalizations: no    Any changes in medication regimen? yes       Details: Discontinued Oxycodone  Recent/future dental: no  Any missed doses?: no       Is patient compliant with meds? yes       Allergies: No Known Drug Allergies  Anticoagulation Management History:      The patient is taking warfarin and comes in today for a routine follow up visit.  Positive risk factors for bleeding include an age of 75 years or older.  Negative risk factors for bleeding include no history of CVA/TIA.  The bleeding index is 'intermediate risk'.  Positive CHADS2 values include History of HTN and Age > 7 years old.  Negative CHADS2 values include Prior Stroke/CVA/TIA.  The start date was 09/25/2004.  Today's INR is 10.4.  Prothrombin time is 111.6.  Anticoagulation responsible provider: Riley Kill MD, Maisie Fus.  INR POC: 8.0.  Cuvette Lot#: [id number].  Exp: 01/2011.    Anticoagulation Management Assessment/Plan:      The patient's current anticoagulation dose is Coumadin 5 mg tabs: TAKE 1 TAB DAILY OR AS DIRECTED BY COUMADIN CLINIC.  The target INR is 2 - 3.  The next INR is due 01/09/2010.  Anticoagulation instructions were given to patient.  Results were reviewed/authorized by Bethena Midget, RN, BSN.  He was notified by Bethena Midget, RN, BSN.         Prior Anticoagulation Instructions: INR 4.0  Skip today's dose of Coumadin then resume same dose of 1 tablet every day except 1 1/2 tablets on Monday, Wednesday and Friday.  Eat 1-2 extra servings on greens every week.   Recheck INR in 2-3 weeks.   Current Anticoagulation Instructions: INR 8.0 Already taken today's dose: Sent to lab:  INR from lab: 10.4  Spoke with pt.  Hold Coumadin until appt next Tuesday.  Pt instructed to go to ER with any signs of bleeding.  [name] PharmD  January 08, 2010 3:57 PM    Appended Document: rov/sp Agree with plan of Dr. Abner Mills. TS

## 2010-02-13 NOTE — Medication Information (Signed)
Summary: rov/sp  Anticoagulant Therapy  Managed by: Bethena Midget, RN, BSN PCP: Etta Grandchild MD Supervising MD: Jens Som MD, Arlys John Indication 1: Atrial Fibrillation (ICD-427.31) Lab Used: [facility] [place] Site: [address] INR POC 1.1 INR RANGE 2 - 3   Dietary changes: no    Health status changes: no    Bleeding/hemorrhagic complications: no    Recent/future hospitalizations: no    Any changes in medication regimen? no    Recent/future dental: no  Any missed doses?: no       Is patient compliant with meds? yes       Allergies: No Known Drug Allergies  Anticoagulation Management History:      The patient is taking warfarin and comes in today for a routine follow up visit.  Positive risk factors for bleeding include an age of 75 years or older.  Negative risk factors for bleeding include no history of CVA/TIA.  The bleeding index is 'intermediate risk'.  Positive CHADS2 values include History of HTN and Age > 75 years old.  Negative CHADS2 values include Prior Stroke/CVA/TIA.  The start date was 09/25/2004.  His last INR was 10.4 ratio.  Anticoagulation responsible provider: Jens Som MD, Arlys John.  INR POC: 1.1.  Cuvette Lot#: [id number].  Exp: 02/2011.    Anticoagulation Management Assessment/Plan:      The patient's current anticoagulation dose is Coumadin 5 mg tabs: TAKE 1 TAB DAILY OR AS DIRECTED BY COUMADIN CLINIC.  The target INR is 2 - 3.  The next INR is due 01/22/2010.  Anticoagulation instructions were given to patient.  Results were reviewed/authorized by Bethena Midget, RN, BSN.  He was notified by Bethena Midget, RN, BSN.         Prior Anticoagulation Instructions: INR 8.0 Already taken today's dose: Sent to lab:  INR from lab: 10.4  Spoke with pt.  Hold Coumadin until appt next Tuesday.  Pt instructed to go to ER with any signs of bleeding.  [name] PharmD  January 08, 2010 3:57 PM    Current Anticoagulation Instructions: INR 1.1 Today take  2 pills then resume 1 pill everyday except 1.5 pills on Mondays, Wednesdays and Fridays. Recheck in one week.

## 2010-02-13 NOTE — Medication Information (Signed)
Summary: rov/nb  Anticoagulant Therapy  Managed by: [name], PharmD PCP: Etta Grandchild MD Supervising MD: Jens Som MD, Arlys John Indication 1: Atrial Fibrillation (ICD-427.31) Lab Used: [facility]  Site: [address] INR POC 4.0 INR RANGE 2 - 3   Dietary changes: no    Health status changes: no    Bleeding/hemorrhagic complications: no    Recent/future hospitalizations: no    Any changes in medication regimen? no    Recent/future dental: no  Any missed doses?: no       Is patient compliant with meds? yes       Allergies: No Known Drug Allergies  Anticoagulation Management History:      The patient is taking warfarin and comes in today for a routine follow up visit.  Positive risk factors for bleeding include an age of 75 years or older.  Negative risk factors for bleeding include no history of CVA/TIA.  The bleeding index is 'intermediate risk'.  Positive CHADS2 values include History of HTN and Age > 75 years old.  Negative CHADS2 values include Prior Stroke/CVA/TIA.  The start date was 09/25/2004.  Anticoagulation responsible Caius Silbernagel: Jens Som MD, Arlys John.  INR POC: 4.0.  Cuvette Lot#: [id number].  Exp: 01/2011.    Anticoagulation Management Assessment/Plan:      The patient's current anticoagulation dose is Coumadin 5 mg tabs: TAKE 1 TAB DAILY OR AS DIRECTED BY COUMADIN CLINIC.  The target INR is 2 - 3.  The next INR is due 01/09/2010.  Anticoagulation instructions were given to patient.  Results were reviewed/authorized by [name], PharmD.  He was notified by [name] PharmD.         Prior Anticoagulation Instructions: INR 3.0 Continue previous dose of 1 tablet everyday except 1.5 tablets on Monday, Wednesday, and Friday. Recheck INR in 4 weeks  Current Anticoagulation Instructions: INR 4.0  Skip today's dose of Coumadin then resume same dose of 1 tablet every day except 1 1/2 tablets on Monday, Wednesday and Friday.  Eat 1-2 extra servings  on greens every week.  Recheck INR in 2-3 weeks.

## 2010-02-13 NOTE — Progress Notes (Signed)
Summary: c/o heart skipping beat  Phone Note Call from Patient Call back at Home Phone [phone]   Caller: Patient Reason for Call: Talk to Nurse Details for Reason: c/o heart skipping beat  ~ 20-30 beat. tingling across shoulder.  Initial call taken by: Lorne Skeens,  January 09, 2010 2:43 PM  Follow-up for Phone Call        heart skips a beat when taking his pulse.  explained to pt that he has a history of At Fib and that he may feel that from time to time. tingling started after he stopped pain medication.  no chest pain.  Pt instructed to call back if s/s change it he becomes SOB, has CP, dizziness etc.  He states understanding Follow-up by: Charolotte Capuchin, RN,  January 09, 2010 3:53 PM

## 2010-02-18 ENCOUNTER — Encounter: Payer: Self-pay | Admitting: Internal Medicine

## 2010-02-18 ENCOUNTER — Encounter (INDEPENDENT_AMBULATORY_CARE_PROVIDER_SITE_OTHER): Payer: Medicare Other | Admitting: Internal Medicine

## 2010-02-18 ENCOUNTER — Encounter (INDEPENDENT_AMBULATORY_CARE_PROVIDER_SITE_OTHER): Payer: Medicare Other

## 2010-02-18 ENCOUNTER — Encounter: Payer: Self-pay | Admitting: Cardiovascular Disease

## 2010-02-18 DIAGNOSIS — I4891 Unspecified atrial fibrillation: Secondary | ICD-10-CM

## 2010-02-18 DIAGNOSIS — I2589 Other forms of chronic ischemic heart disease: Secondary | ICD-10-CM

## 2010-02-18 DIAGNOSIS — I10 Essential (primary) hypertension: Secondary | ICD-10-CM

## 2010-02-18 DIAGNOSIS — Z7901 Long term (current) use of anticoagulants: Secondary | ICD-10-CM

## 2010-02-18 DIAGNOSIS — E782 Mixed hyperlipidemia: Secondary | ICD-10-CM

## 2010-02-18 DIAGNOSIS — I498 Other specified cardiac arrhythmias: Secondary | ICD-10-CM

## 2010-02-27 NOTE — Letter (Signed)
Summary: [facility]  [facility]   Imported By: Sherian Rein 02/18/2010 08:16:44  _____________________________________________________________________  External Attachment:    Type:   Image     Comment:   External Document

## 2010-02-27 NOTE — Cardiovascular Report (Signed)
Summary: Office Visit   Office Visit   Imported By: Roderic Ovens 02/19/2010 10:03:53  _____________________________________________________________________  External Attachment:    Type:   Image     Comment:   External Document

## 2010-02-27 NOTE — Assessment & Plan Note (Signed)
Summary: DEVICE CK/SAF=MJ   Visit Type:  Follow-up Primary Provider:  Etta Grandchild MD   History of Present Illness: [name] returns today for followup. He is a pleasant 77 yo man with a h/o CAD, preserved LV function, atrial fibrillation and symptomatic bradycardia. He is s/p PPM. He has done well since his last visit. He denies c/p, sob, or peripheral edema. He remains active. No syncope.  Current Medications (verified): 1)  Nitroglycerin 0.4 Mg Subl (Nitroglycerin) .... One Tablet Under Tongue Every 5 Minutes As Needed For Chest Pain---May Repeat Times Three 2)  Simvastatin 40 Mg Tabs (Simvastatin) .... 1/2 By Mouth Daily 3)  Hydrochlorothiazide 25 Mg Tabs (Hydrochlorothiazide) .... Take One Tablet By Mouth Daily. 4)  Lisinopril 20 Mg Tabs (Lisinopril) .... Take One Tablet By Mouth Daily 5)  Klor-Con M20 20 Meq Cr-Tabs (Potassium Chloride Crys Cr) .... Take 1 Tablet By Mouth Once A Day 6)  Coumadin 5 Mg Tabs (Warfarin Sodium) .... Take 1 Tab Daily or As Directed By Coumadin Clinic 7)  Viagra 50 Mg Tabs (Sildenafil Citrate) .... Take 1 Tablet By Mouth As Directed 8)  Amlodipine Besylate 10 Mg Tabs (Amlodipine Besylate) .... Take One Tablet By Mouth Daily 9)  Allopurinol 100 Mg Tabs (Allopurinol) .... One By Mouth Once Daily To Prevent Gout 10)  Rapaflo 8 Mg Caps (Silodosin) .... One By Mouth Once Daily To Help Urine Flow  Allergies (verified): No Known Drug Allergies  Past History:  Past Medical History: Last updated: 12/10/2009 ASCVD: Coronary artery bypass graft surgery in 1983; patent grafts on coronary angiography in 7/97 with     normal ejection fraction; total obstruction of the vein graft to the second marginal in 2004; stress nuclear and     echo in 2006: Inferior scar; basilar inferior and posterior akinesis; ejection fraction of 45%; adenosine stress     nuclear study in 05/2008: Low normal left ventricular systolic function; severe hypokinesis of the basilar and mid    inferior and inferoseptal segments; inferior, inferolateral and inferoapical scarring without ischemia Cerebrovascular disease: Right carotid bruit; minimal plaque on ultrasound in 01/2004 Sick sinus syndrome: Sinus bradycardia and atrial fibrillation; dual-chamber Guidant pacemaker in 08/2004 Chronic anticoagulation Tobacco abuse: 20 pack years; discontinued in 1983. HYPERLIPIDEMIA (ICD-272.4) GOUT, UNSPECIFIED (ICD-274.9) HYPERTENSION (ICD-401.9) VERTIGO (ICD-780.4) Osteoarthritis  Past Surgical History: Last updated: 11/21/2008 S/P TURP S/P LS SPINE FUSION (2003) in 2006; prior cervical spine procedure with implantation of hardware Cholecystectomy CABG TIMES 3 (1983) APPENDECTOMY 2 KNEE SURGERYS (LEFT ) Guidant pacemaker implant-08/2004  Review of Systems  The patient denies chest pain, syncope, dyspnea on exertion, and peripheral edema.    Vital Signs:  Patient profile:   77 year old male Height:      74 inches Weight:      196 pounds BMI:     25.26 Pulse rate:   60 / minute BP sitting:   120 / 80  (left arm)  Vitals Entered By: Laurance Flatten CMA (February 18, 2010 10:08 AM)  Physical Exam  General:  alert, well-developed, well-nourished, well-hydrated, appropriate dress, normal appearance, healthy-appearing, cooperative to examination, and good hygiene.   Head:  normocephalic and atraumatic.   Eyes:  vision grossly intact, pupils equal, and no retinal abnormalitiies.   Mouth:  Oral mucosa and oropharynx without lesions or exudates.  Teeth in good repair. Neck:  Neck supple, no JVD. No masses, thyromegaly or abnormal cervical nodes. Chest Wall:  Well-healed pacemaker pocket and sternotomy scar Lungs:  normal  respiratory effort, no intercostal retractions, no accessory muscle use, normal breath sounds, no dullness, no fremitus, no crackles, and no wheezes.   Heart:  normal rate, regular rhythm, no murmur, no gallop, no rub, and no JVD.   Abdomen:  soft, non-tender, normal  bowel sounds, no distention, no masses, no guarding, no rigidity, no rebound tenderness, no abdominal hernia, no inguinal hernia, no hepatomegaly, no splenomegaly, and abdominal scar(s).   Msk:  normal ROM, no joint tenderness, no joint swelling, no joint warmth, no redness over joints, no joint deformities, no joint instability, no crepitation, and no muscle atrophy.   Pulses:  R and L carotid,radial,femoral,dorsalis pedis and posterior tibial pulses are full and equal bilaterally Extremities:  No clubbing, cyanosis, edema, or deformity noted with normal full range of motion of all joints.   Neurologic:  No cranial nerve deficits noted. Station and gait are normal. Plantar reflexes are down-going bilaterally. DTRs are symmetrical throughout. Sensory, motor and coordinative functions appear intact.   PPM Specifications Following MD:  [name], MD     PPM Vendor:  [facility] [employer]     PPM Model Number:  [id number]     PPM Serial Number:  [id number] PPM DOI:  08/12/2004      Lead 1    Location: RA     DOI: 08/12/2004     Model #: [id number]     Serial #: [id number]     Status: active Lead 2    Location: RV     DOI: 08/12/2004     Model #: [id number]     Serial #: [id number]     Status: active  Magnet Response Rate:  BOL 100 ERI  85  Indications:  [name]  Explantation Comments:  +Coumadin   PPM Follow Up Battery Voltage:  GOOD V     Battery Est. Longevity:  5.0 YRS     Pacer Dependent:  Yes     Right Ventricle  Amplitude: 12.0 mV, Impedance: 580 ohms, Threshold: 1.2 V at 0.40 msec  Episodes MS Episodes:  0     Coumadin:  Yes Ventricular High Rate:  1     Ventricular Pacing:  96%  Parameters Mode:  VVIR     Lower Rate Limit:  60     Upper Rate Limit:  120 Next Cardiology Appt Due:  08/13/2010 Tech Comments:  1 VHR EPISODE ON 01-14-10.  NORMAL DEVICE FUNCTION.  NO CHANGES MADE. ROV IN 6 MTHS W/DEVICE CLINIC. [name]  February 18, 2010 10:18 AM MD Comments:  Agree with above.  Impression &  Recommendations:  Problem # 1:  CARDIAC PACEMAKER IN SITU (ICD-V45.01) His device is working normally. Will recheck in several months.  Problem # 2:  ATRIAL FIBRILLATION (ICD-427.31) His ventricular rate is well controlled.  He will continue his current meds. His updated medication list for this problem includes:    Coumadin 5 Mg Tabs (Warfarin sodium) .Marland Kitchen... Take 1 tab daily or as directed by coumadin clinic  Problem # 3:  ATHEROSCLEROTIC CARDIOVASCULAR DISEASE (ICD-429.2) He is angina free. I will continue his current meds. He remains active.  Patient Instructions: 1)  Your physician wants you to follow-up in: 6 months with device clinic and 12 months with Dr Court Joy will receive a reminder letter in the mail two months in advance. If you don't receive a letter, please call our office to schedule the follow-up appointment. 2)  Your physician recommends that you continue on your current medications as  directed. Please refer to the Current Medication list given to you today.  Appended Document: [facility]     Current Medications (verified): 1)  Nitroglycerin 0.4 Mg Subl (Nitroglycerin) .... One Tablet Under Tongue Every 5 Minutes As Needed For Chest Pain---May Repeat Times Three 2)  Simvastatin 40 Mg Tabs (Simvastatin) .... 1/2 By Mouth Daily 3)  Hydrochlorothiazide 25 Mg Tabs (Hydrochlorothiazide) .... Take One Tablet By Mouth Daily. 4)  Lisinopril 20 Mg Tabs (Lisinopril) .... Take One Tablet By Mouth Daily 5)  Klor-Con M20 20 Meq Cr-Tabs (Potassium Chloride Crys Cr) .... Take 1 Tablet By Mouth Once A Day 6)  Coumadin 5 Mg Tabs (Warfarin Sodium) .... Take 1 Tab Daily or As Directed By Coumadin Clinic 7)  Viagra 50 Mg Tabs (Sildenafil Citrate) .... Take 1 Tablet By Mouth As Directed 8)  Amlodipine Besylate 10 Mg Tabs (Amlodipine Besylate) .... Take One Tablet By Mouth Daily 9)  Allopurinol 100 Mg Tabs (Allopurinol) .... One By Mouth Once Daily To Prevent Gout 10)  Rapaflo 8  Mg Caps (Silodosin) .... One By Mouth Once Daily To Help Urine Flow 11)  Cephalexin 500 Mg Caps (Cephalexin) .... Two Times A Day  Allergies: No Known Drug Allergies   PPM Specifications Following MD:  [name], MD     PPM Vendor:  [employer]     PPM Model Number:  [id number]     PPM Serial Number:  [id number] PPM DOI:  08/12/2004      Lead 1    Location: RA     DOI: 08/12/2004     Model #: [id number]     Serial #: [id number]     Status: active Lead 2    Location: RV     DOI: 08/12/2004     Model #: [id number]     Serial #: [id number]     Status: active  Magnet Response Rate:  BOL 100 ERI  85  Indications:  [name]  Explantation Comments:  +Coumadin   PPM Follow Up Pacer Dependent:  Yes      Episodes Coumadin:  Yes  Parameters Mode:  VVIR     Lower Rate Limit:  60     Upper Rate Limit:  120

## 2010-02-27 NOTE — Medication Information (Signed)
Summary: Coumadin Clinic  Anticoagulant Therapy  Managed by: Cloyde Reams, RN, BSN PCP: Etta Grandchild MD Supervising MD: Eden Emms MD, Theron Arista Indication 1: Atrial Fibrillation (ICD-427.31) Lab Used: [facility] Site: [address] INR POC 2.4 INR RANGE 2 - 3   Dietary changes: no    Health status changes: no    Bleeding/hemorrhagic complications: no    Recent/future hospitalizations: no    Any changes in medication regimen? no    Recent/future dental: no  Any missed doses?: no       Is patient compliant with meds? yes       Allergies: No Known Drug Allergies  Anticoagulation Management History:      The patient is taking warfarin and comes in today for a routine follow up visit.  Positive risk factors for bleeding include an age of 75 years or older.  Negative risk factors for bleeding include no history of CVA/TIA.  The bleeding index is 'intermediate risk'.  Positive CHADS2 values include History of HTN and Age > 75 years old.  Negative CHADS2 values include Prior Stroke/CVA/TIA.  The start date was 09/25/2004.  His last INR was 10.4 ratio.  Anticoagulation responsible provider: Eden Emms MD, Theron Arista.  INR POC: 2.4.  Cuvette Lot#: [id number].  Exp: 02/2011.    Anticoagulation Management Assessment/Plan:      The patient's current anticoagulation dose is Coumadin 5 mg tabs: TAKE 1 TAB DAILY OR AS DIRECTED BY COUMADIN CLINIC.  The target INR is 2 - 3.  The next INR is due 03/18/2010.  Anticoagulation instructions were given to patient.  Results were reviewed/authorized by Cloyde Reams, RN, BSN.  He was notified by Bethena Midget, RN, BSN.         Prior Anticoagulation Instructions: INR 2.7  Continue on same dosage 1 tablet daily except 1.5 tablets on Mondays, Wednesdays, and Fridays.  Recheck in 2 weeks.    Current Anticoagulation Instructions: INR 2.4  Continue on same dosage 1 tablet daily except 1.5 tablets on Mondays, Wednesdays, and Fridays.  Recheck in  4 weeks.

## 2010-03-03 DIAGNOSIS — I4891 Unspecified atrial fibrillation: Secondary | ICD-10-CM

## 2010-03-04 ENCOUNTER — Telehealth: Payer: Self-pay | Admitting: Internal Medicine

## 2010-03-11 NOTE — Progress Notes (Signed)
Summary: rx for gout sent  Phone Note Call from Patient Call back at Home Phone [phone]   Summary of Call: Pt has been taking allopurinol but c/o increase in gout pain. He is req additional med from MD to help.  Initial call taken by: Lamar Sprinkles, CMA,  March 04, 2010 12:15 PM    New/Updated Medications: COLCRYS 0.6 MG TABS (COLCHICINE) One by mouth two times a day as needed for gout pain Prescriptions: COLCRYS 0.6 MG TABS (COLCHICINE) One by mouth two times a day as needed for gout pain  #60 x 11   Entered by:   Burnard Leigh CMA(AAMA)   Authorized by:   Etta Grandchild MD   Signed by:   Burnard Leigh [facility]) on 03/04/2010   Method used:   Electronically to        [facility]. [phone]* (retail)       [address]       [place], [place]  [address]       Ph: [phone]       Fax: [phone]   RxID:   [id number] COLCRYS 0.6 MG TABS (COLCHICINE) One by mouth two times a day as needed for gout pain  #60 x 11   Entered and Authorized by:   Etta Grandchild MD   Signed by:   Etta Grandchild MD on 03/04/2010   Method used:   Historical   RxID:   [id number]   Appended Document: rx for gout sent Pt aware of new Rx for gout electronically sent to [facility] per Pt request/lsls,cma

## 2010-03-18 ENCOUNTER — Encounter: Payer: Self-pay | Admitting: Cardiology

## 2010-03-18 ENCOUNTER — Encounter (INDEPENDENT_AMBULATORY_CARE_PROVIDER_SITE_OTHER): Payer: Medicare Other

## 2010-03-18 DIAGNOSIS — I4891 Unspecified atrial fibrillation: Secondary | ICD-10-CM

## 2010-03-18 DIAGNOSIS — Z7901 Long term (current) use of anticoagulants: Secondary | ICD-10-CM

## 2010-03-18 LAB — CONVERTED CEMR LAB: POC INR: 2.1

## 2010-03-25 NOTE — Medication Information (Signed)
Summary: rov/ewj  Anticoagulant Therapy  Managed by: Windell Hummingbird, RN PCP: Etta Grandchild MD Supervising MD: Antoine Poche MD, Fayrene Fearing Indication 1: Atrial Fibrillation (ICD-427.31) Lab Used: [facility] [place] Site: [address] INR POC 2.1 INR RANGE 2 - 3   Dietary changes: no    Health status changes: no    Bleeding/hemorrhagic complications: no    Recent/future hospitalizations: no    Any changes in medication regimen? no    Recent/future dental: no  Any missed doses?: no       Is patient compliant with meds? yes       Allergies: No Known Drug Allergies  Anticoagulation Management History:      The patient is taking warfarin and comes in today for a routine follow up visit.  Positive risk factors for bleeding include an age of 75 years or older.  Negative risk factors for bleeding include no history of CVA/TIA.  The bleeding index is 'intermediate risk'.  Positive CHADS2 values include History of HTN and Age > 75 years old.  Negative CHADS2 values include Prior Stroke/CVA/TIA.  The start date was 09/25/2004.  His last INR was 10.4 ratio.  Anticoagulation responsible provider: Antoine Poche MD, Fayrene Fearing.  INR POC: 2.1.  Cuvette Lot#: [id number].  Exp: 01/2011.    Anticoagulation Management Assessment/Plan:      The patient's current anticoagulation dose is Coumadin 5 mg tabs: TAKE 1 TAB DAILY OR AS DIRECTED BY COUMADIN CLINIC.  The target INR is 2 - 3.  The next INR is due 04/15/2010.  Anticoagulation instructions were given to patient.  Results were reviewed/authorized by Windell Hummingbird, RN.  He was notified by Windell Hummingbird, RN.         Prior Anticoagulation Instructions: INR 2.4  Continue on same dosage 1 tablet daily except 1.5 tablets on Mondays, Wednesdays, and Fridays.  Recheck in 4 weeks.   Current Anticoagulation Instructions: INR 2.1 Continue taking 1 tablet every day, except take 1 1/2 tablets on Mondays, Wednesdays, and Fridays. Recheck in 4 weeks.

## 2010-04-15 ENCOUNTER — Ambulatory Visit (INDEPENDENT_AMBULATORY_CARE_PROVIDER_SITE_OTHER): Payer: Medicare Other | Admitting: *Deleted

## 2010-04-15 DIAGNOSIS — Z7901 Long term (current) use of anticoagulants: Secondary | ICD-10-CM

## 2010-04-15 DIAGNOSIS — I4891 Unspecified atrial fibrillation: Secondary | ICD-10-CM

## 2010-04-15 NOTE — Patient Instructions (Signed)
Continue on same dosage 1 tablet daily except 1.5 tablets on Mondays, Wednesdays, and Fridays.  Recheck in 4 weeks.  

## 2010-04-16 LAB — BASIC METABOLIC PANEL
CO2: 31 mEq/L (ref 19–32)
Chloride: 103 mEq/L (ref 96–112)
Glucose, Bld: 111 mg/dL — ABNORMAL HIGH (ref 70–99)
Potassium: 4.3 mEq/L (ref 3.5–5.1)
Sodium: 143 mEq/L (ref 135–145)

## 2010-04-16 LAB — LIPID PANEL: VLDL: 31 mg/dL (ref 0–40)

## 2010-04-16 LAB — CBC
HCT: 41.2 % (ref 39.0–52.0)
Hemoglobin: 14.1 g/dL (ref 13.0–17.0)
MCHC: 34.2 g/dL (ref 30.0–36.0)
MCV: 94.3 fL (ref 78.0–100.0)
RDW: 14.4 % (ref 11.5–15.5)

## 2010-04-16 LAB — APTT: aPTT: 30 seconds (ref 24–37)

## 2010-04-22 ENCOUNTER — Telehealth: Payer: Self-pay | Admitting: *Deleted

## 2010-04-22 NOTE — Telephone Encounter (Signed)
Pt left vm req a call back regarding med refill

## 2010-04-23 ENCOUNTER — Other Ambulatory Visit: Payer: Self-pay | Admitting: *Deleted

## 2010-04-23 MED ORDER — WARFARIN SODIUM 5 MG PO TABS: 5.0000 mg | ORAL_TABLET | ORAL | Status: DC

## 2010-04-23 NOTE — Telephone Encounter (Signed)
Needs coumadin RF, advised that he must get RF thru coumadin clinic b/c they manage this, he agreed and I transferred call.

## 2010-05-13 ENCOUNTER — Ambulatory Visit (INDEPENDENT_AMBULATORY_CARE_PROVIDER_SITE_OTHER): Payer: Medicare Other | Admitting: *Deleted

## 2010-05-13 DIAGNOSIS — I4891 Unspecified atrial fibrillation: Secondary | ICD-10-CM

## 2010-05-27 ENCOUNTER — Telehealth: Payer: Self-pay | Admitting: Internal Medicine

## 2010-05-27 NOTE — Assessment & Plan Note (Signed)
[facility]                         ELECTROPHYSIOLOGY OFFICE NOTE   NAME:Mullaly, TEMILOLUWA LAREDO                      [name]:          [id number]  DATE:08/19/2006                            DOB:          04/21/1932    Mr. Gunther returns today for followup.  He is a pleasant 77 year old male  with chronic atrial fibrillation and symptomatic bradycardia who is  status post pacemaker insertion.  He returns today for followup.  Overall, he has done well.  He denies chest pain, shortness of breath.  He has had no syncope or dizzy spells.  He does note in the interim he  has had neck surgery by Dr. Channing Mutters.   PHYSICAL EXAMINATION:  GENERAL:  He is a pleasant, very well appearing,  77 year old man in no acute distress.  VITAL SIGNS:  His blood pressure was 126/90.  The pulse was 60 and  regular.  Respirations were 18.  The weight was 182 pounds.  NECK:  Revealed no jugular venous distention.  There was a well healed  posterior cervical spinal scar on the back of his neck.  LUNGS:  Clear bilaterally to auscultation.  No wheezes, rales, or  rhonchi are present.  CARDIOVASCULAR:  Revealed a regular rate and rhythm with a normal S1 and  S2.  EXTREMITIES:  Demonstrated no edema.   MEDICATIONS:  1. Allopurinol.  2. Simvastatin.  3. HCTZ 25 a day.  4. Lisinopril 20 a day.  5. Coumadin daily.   Interrogation of his pacemaker today demonstrates a Guidant Ultra with R  waves greater than 12 with an impedance of 580 ohms.  The threshold was  a volt at 0.4.  Battery is beginning of life.  Review of the histogram  demonstrated that he is 92% V paced, and a very flat heart rate  response.  Today we turned minute ventilation on in hopes of giving him  some more chronotropic competence.  Of note the EKG today demonstrates  atrial fibrillation with ventricular pacing.   IMPRESSION:  1. Symptomatic bradycardia.  2. Chronic atrial fibrillation.  3. Status post pacemaker  insertion.   DISCUSSION:  Overall, Mr. Willhoite is stable and his pacemaker is working  normally.  We have changed his rate response today to adding minute  ventilation.  Hopefully, this will help him have even more energy.  We  will see him in a year.     Doylene Canning. [name], MD  Electronically Signed    GWT/MedQ  DD: 08/19/2006  DT: 08/19/2006  Job #: [id number]

## 2010-05-27 NOTE — H&P (Signed)
NAME:  [name], SPICKLER NO.:  [id number]   MEDICAL RECORD NO.:  [id number]          PATIENT TYPE:  AMB   LOCATION:  DAY                           FACILITY:  [facility]   PHYSICIAN:  Dalia Heading, M.D.  DATE OF BIRTH:  Jun 03, 1932   DATE OF ADMISSION:  DATE OF DISCHARGE:  LH                              HISTORY & PHYSICAL   CHIEF COMPLAINT:  Need for screening colonoscopy.   HISTORY OF PRESENT ILLNESS:  The patient is a 77 year old white male who  was referred for funduscopic evaluation.  He needs colonoscopy for  screening purposes.  No abdominal pain, weight loss, nausea, vomiting,  diarrhea, constipation, melena, or hematochezia have been noted.  He has  never had a colonoscopy.  There is no family history of colon carcinoma.   PAST MEDICAL HISTORY:  1. Coronary artery disease.  2. Hypertension.  3. Sick sinus syndrome.   PAST SURGICAL HISTORY:  CABG, pacemaker placement, cholecystectomy, neck  surgery, back surgery, and bladder surgery.   CURRENT MEDICATIONS:  Potassium supplements, allopurinol, simvastatin,  warfarin, hydrochlorothiazide, amlodipine, lisinopril, and Detrol.   ALLERGIES:  No known drug allergies.   REVIEW OF SYSTEMS:  Noncontributory.   PHYSICAL EXAMINATION:  GENERAL:  The patient is a well-developed, well-  nourished white male in no acute distress.  LUNGS:  Clear to auscultation with equal breath sounds bilaterally.  HEART:  Regular rate and rhythm without S3, S4, or murmurs.  ABDOMEN:  Soft, nontender, and nondistended.  No hepatosplenomegaly or  masses were noted.  RECTAL:  Deferred due to the procedure.   IMPRESSION:  Need for screening colonoscopy.   PLAN:  The patient is scheduled for a colonoscopy on November 08, 2007.  The risks and benefits of the procedure including bleeding and  perforation were fully explained to the patient, gained informed  consent.  He is to hold his Coumadin for 5 days prior to the procedure.      Dalia Heading, M.D.  Electronically Signed     MAJ/MEDQ  D:  11/01/2007  T:  11/02/2007  Job:  [id number]   cc:   Madelin Rear. Sherwood Gambler, MD  Fax: [phone]

## 2010-05-27 NOTE — H&P (Signed)
NAME:  [name], [name] NO.:  [id number]   MEDICAL RECORD NO.:  [id number]          PATIENT TYPE:  AMB   LOCATION:  DAY                           FACILITY:  [facility]   PHYSICIAN:  Dalia Heading, M.D.  DATE OF BIRTH:  1932-07-01   DATE OF ADMISSION:  DATE OF DISCHARGE:  LH                              HISTORY & PHYSICAL   CHIEF COMPLAINT:  Need for screening colonoscopy.   HISTORY OF PRESENT ILLNESS:  The patient is a 77 year old white male who  was referred for funduscopic evaluation.  He needs colonoscopy for  screening purposes.  No abdominal pain, weight loss, nausea, vomiting,  diarrhea, constipation, melena, or hematochezia have been noted.  He has  never had a colonoscopy.  There is no family history of colon carcinoma.   PAST MEDICAL HISTORY:  1. Coronary artery disease.  2. Hypertension.  3. Sick sinus syndrome.   PAST SURGICAL HISTORY:  CABG, pacemaker placement, cholecystectomy, neck  surgery, back surgery, and bladder surgery.   CURRENT MEDICATIONS:  Potassium supplements, allopurinol, simvastatin,  warfarin, hydrochlorothiazide, amlodipine, lisinopril, and Detrol.   ALLERGIES:  No known drug allergies.   REVIEW OF SYSTEMS:  Noncontributory.   PHYSICAL EXAMINATION:  GENERAL:  The patient is a well-developed, well-  nourished white male in no acute distress.  LUNGS:  Clear to auscultation with equal breath sounds bilaterally.  HEART:  Regular rate and rhythm without S3, S4, or murmurs.  ABDOMEN:  Soft, nontender, and nondistended.  No hepatosplenomegaly or  masses were noted.  RECTAL:  Deferred due to the procedure.   IMPRESSION:  Need for screening colonoscopy.   PLAN:  The patient is scheduled for a colonoscopy on November 08, 2007.  The risks and benefits of the procedure including bleeding and  perforation were fully explained to the patient, gained informed  consent.  He is to hold his Coumadin for 5 days prior to the procedure.      Dalia Heading, M.D.     MAJ/MEDQ  D:  11/01/2007  T:  11/02/2007  Job:  [id number]   cc:   Madelin Rear. Sherwood Gambler, MD  Fax: [phone]

## 2010-05-27 NOTE — Assessment & Plan Note (Signed)
[facility]                         ELECTROPHYSIOLOGY OFFICE NOTE   NAME:Nardone, HEVER CASTILLEJA                      [name]:          [id number]  DATE:09/02/2007                            DOB:          [birth year]    [name] turns today for followup.  He is a very pleasant elderly male  with a history of chronic atrial fibrillation, symptomatic bradycardia,  coronary artery disease with preserved LV function, status post multiple  back operations and neck operations who returns today for followup.  He  has no particular complaints today except he does have continuation of  his chronic vertigo symptoms though they were now worst than they ever  have been.   MEDICATIONS:  1. Amlodipine 5 mg a day.  2. Coumadin as directed.  3. Potassium supplement.  4. Lisinopril 20 mg a day.  5. HCTZ 25 mg a day.  6. Simvastatin 40 a day.  7. Allopurinol 300 mg a day.   PHYSICAL EXAMINATION:  GENERAL:  He is a pleasant well-appearing elderly  man, in no distress.  VITAL SIGNS:  Blood pressure was 122/83, the pulse was 80 and regular,  respirations were 18, and the weight was 178 pounds.  NECK:  No jugular distention.  LUNGS:  Clear bilaterally to auscultation.  No wheezes, rales, or  rhonchi are present.  CARDIAC:  Irregular rate and rhythm.  Normal S1 and S2.  EXTREMITIES:  No edema.   Interrogation of his pacemaker demonstrates a Guidant ultra, R-waves  were unavailable secondary to complete heart block.  The impedance 590,  threshold volts at 0.4.  He was 96% V paced.   IMPRESSION:  1. Chronic atrial fibrillation.  2. Symptomatic bradycardia with complete heart block.  3. Status post pacemaker insertion.  4. Hypertension.  5. Coronary artery disease with preserved left ventricular function.   DISCUSSION:  Mr. Rinn is stable.  His pacemaker is working normally.  His heart is in no symptomatic coronary artery disease at present time.  I will see him back in  the office in 1 year.      Doylene Canning. [name], MD  Electronically Signed    GWT/MedQ  DD: 09/02/2007  DT: 09/03/2007  Job #: [id number]

## 2010-05-27 NOTE — Assessment & Plan Note (Signed)
[facility]                       [place] CARDIOLOGY OFFICE NOTE   NAME:JOYCEAkshat, Mills                      [name]:          [id number]  DATE:05/28/2008                            DOB:          1932/09/18    CARDIOLOGIST:  Dr.  Bing   ELECTROPHYSIOLOGIST:  Dr. [name]   PRIMARY CARE PHYSICIAN:  Dr. Patrica Duel   REASON FOR VISIT:  Chest pain.   HISTORY OF PRESENT ILLNESS:  [name] is a 77 year old male patient  with a history of coronary artery disease, status post bypass surgery in  1984 at [facility] and permanent atrial fibrillation on chronic  Coumadin therapy, status post permanent pacemaker implantation secondary  to symptomatic bradycardia who presents to the office today for followup  in the Coumadin Clinic.  He noted some chest pain and was added onto my  schedule.  He has mainly been followed by Dr. [name] in the last several  years in our clinic.  He has not seen Dr. Dietrich Pates in quite some time.  He has generally done well without significant chest pain or shortness  of breath.  He dances quite often and is very active.  He went kayaking  about a week ago.  He had no symptoms after this but over the weekend  noted some discomfort in his chest that he thought was indigestion.  He  then developed a right-sided discomfort that he cannot really describe.  He feels like it is a deep sensation.  He does note some pleuritic  symptoms.  He denies syncope.  He denies cough or hemoptysis.  He denies  any crushing discomfort.  He denies any shortness of breath, nausea,  diaphoresis.  He denies syncope or near-syncope.  He denies orthopnea,  PND or pedal edema.  He awoke this morning to go the restroom.  He noted  the chest pain continued at that time.  It has been fairly continuous  since this morning.  In the office, it did resolve on its own.  He has  not tried any antacids or nitroglycerin for his symptoms.   PAST  MEDICAL HISTORY:  1. Coronary artery disease, status post bypass surgery in 1983.      a.     Cardiac catheterization done September 2004 secondary to       probable acute coronary syndrome demonstrated patent vein graft to       the LAD and patent vein    graft to the RCA.  His vein graft to the obtuse marginal was occluded.  This was well collateralized from the right.    B.  Last nuclear scan was performed in June 2006 and demonstrated scar  in the inferior inferolateral wall, no evidence of ischemia.    C.  Last echocardiogram done in June 2006 demonstrated inferoposterior  basal akinesis with overall moderate reduction in LV function, mild LVH,  mild    atrial enlargement, mild RV enlargement, mild mitral and tricuspid  regurgitation.  1. Hypertension.  2. Dyslipidemia.  3. Gout.  4. Minimal plaque by carotid Dopplers January 2006.  5. Permanent atrial fibrillation on  chronic Coumadin therapy.  6. Status post permanent pacemaker implantation August 2006 secondary      to symptomatic bradycardia.  7. Vertigo.  8. Status post cholecystectomy.  9. Status post lumbosacral spine fusion in 2003 as well as lumbosacral      spine surgery in August 2006.  10.Status post TURP.   MEDICATIONS:  1. Allopurinol 300 mg daily.  2. Simvastatin 40 mg daily.  3. Hydrochlorothiazide 25 mg daily.  4. Lisinopril 20 mg daily.  5. Potassium 20 mEq daily.  6. Coumadin as directed.  7. Amlodipine 5 mg daily.   ALLERGIES:  NO KNOWN DRUG ALLERGIES.   SOCIAL HISTORY:  He is an ex-smoker.  He quit in 1983 after 20-pack  years.   FAMILY HISTORY:  Insignificant for premature CAD.   REVIEW OF SYSTEMS:  Please see HPI.  Denies any fevers, chills, cough,  melena, hematochezia, hematuria, dysuria.  Rest of the review of systems  is negative.   PHYSICAL EXAMINATION:  GENERAL:  He is a well-nourished, well-developed  male in no acute distress.  VITAL SIGNS:  Blood pressure 133/91, pulse 64, weight  178 pounds.  HEENT:  Norma.  NECK:  Without JVD.  LYMPH:  Without lymphadenopathy.  ENDOCRINE:  Without thyromegaly.  CARDIAC:  Normal S1, S2, regular rate and rhythm.  LUNGS:  Clear to auscultation bilaterally.  ABDOMEN:  Soft, nontender.  EXTREMITIES:  Without edema.  NEUROLOGIC:  He is alert and oriented x3.  Cranial II-XII are grossly  intact.  SKIN:  Warm and dry.  CHEST WALL:  Without tenderness to palpation.  VASCULAR EXAM:  Dorsalis pedis and posterior tibialis pulses are 2+  bilaterally.   Electrocardiogram reveals underlying atrial fibrillation with  ventricular-paced rhythm.  Heart rate 63.   ASSESSMENT/PLAN:  [name] is a 77 year old male with known history of  coronary disease, status post bypass surgery and coronary anatomy as  outlined above who presents to the office today with chest discomfort.  This is somewhat atypical and sounds somewhat consistent with  musculoskeletal symptoms.  His INR today was 1.6.  I doubt the  possibility of pulmonary embolism.  However, his symptoms are pleuritic  in nature.  We will obtain blood work which will include a D-dimer as  well as a CMET and CBC.  He will also have a chest x-ray performed given  the pleuritic nature of his symptoms.  Given his coronary disease, we  will also set him up for a Lexiscan Myoview study to rule out the  possibility of ischemia.  His amlodipine will be adjusted from 5 to 10  mg a day for better blood pressure control.  He will be given updated  prescription for nitroglycerin.  He knows to return to our office or go  to the emergency room should he have a change in his symptoms.  The  patient was also interviewed and examined by Dr. Daleen Squibb today.  He agrees  with the above findings and was involved in the above plan.   DISPOSITION:  He will be brought back in followup with either me later  this week on a day that Dr. Dietrich Pates is here or early next week with Dr.  Dietrich Pates.      Javier Newcomer,  PA-C  Electronically Signed      Jesse Sans. Daleen Squibb, MD, [facility]  Electronically Signed   SW/MedQ  DD: 05/28/2008  DT: 05/28/2008  Job #: [id number]   cc:   Patrica Duel, M.D.  Doylene Canning. [name],  MD

## 2010-05-27 NOTE — Assessment & Plan Note (Signed)
[facility]                       [place] CARDIOLOGY OFFICE NOTE   NAME:[name], [name]                      [name]:          [id number]  DATE:06/07/2008                            DOB:          September 25, 1932    CARDIOLOGIST:  Gerrit Friends. Dietrich Pates, MD, [facility]   PRIMARY CARE PHYSICIAN:  Patrica Duel, MD   REASON FOR VISIT:  Followup.   HISTORY OF PRESENT ILLNESS:  [name] is a 77 year old male patient  with a history of coronary artery disease, status post bypass surgery in  1984, whom I saw for chest pain on May 28, 2008.  At that time, we set  him up for some blood work and a Myoview study.  His D-dimer came back  elevated and he had a chest CT scan that demonstrated no pulmonary  embolism.  He did have small nodular foci within the right lung that  were grossly stable.  Otherwise, his blood work was within normal  limits.  He returns for followup today.  He has not had any further  chest pain.  He denies any exertional chest discomfort or shortness of  breath.  He denies syncope, near syncope, orthopnea, PND, or pedal  edema.   CURRENT MEDICATIONS:  1. Allopurinol 300 mg daily.  2. Simvastatin 40 mg daily.  3. HCTZ 25 mg daily.  4. Lisinopril 20 mg daily.  5. Potassium 20 mEq daily.  6. Coumadin as directed.  7. Sular 17 mg daily.  8. Nitroglycerin p.r.n. chest pain.   PHYSICAL EXAMINATION:  GENERAL:  He is a well-nourished, well-developed  male in no acute distress.  VITAL SIGNS:  Blood pressure is 108/66, pulse is 56, and weight is 180  pounds.  HEENT:  Normal.  NECK:  Without JVD.  CARDIAC:  S1 and S2.  Regular rate and rhythm.  LUNGS:  Clear to auscultation bilaterally.  ABDOMEN:  Soft and nontender.  EXTREMITIES:  Without edema.  NEUROLOGIC:  He is alert and oriented x3.  Cranial nerves II through XII  are grossly intact.   ASSESSMENT AND PLAN:  1. Chest pain.  As noted previously, [name] has a history of      coronary artery  disease, status post bypass surgery in 1984 with      heart catheterization in 2004, demonstrating a patent vein graft to      the LAD, and patent vein graft to the RCA, and an occluded vein      graft to obtuse marginal that was well collateralized from the      right.  His most recent nuclear scan ordered at that time of      evaluation last week demonstrated an EF of 51% inferior      inferolateral and inferoapical scarring without evidence of      ischemia.  His chest symptoms seem to be musculoskeletal in nature.      They seem to have resolved.  No further workup is warranted at this      time.  2. Pulmonary nodules.  These were small and seemed to be stable when  compared to CT scan in October 2007.  I discussed with Dr.      Dietrich Pates, who also saw the patient.  We will plan no further workup      at this time.  3. Hypertension.  This is much better controlled.  I adjusted his      Sular at last visit.  He needs to change to a generic secondary to      cost.  I have recommend that we will switch him over to amlodipine      5 mg a day.  4. Permanent fibrillation on chronic Coumadin therapy.  He will      continue follow up with Coumadin Clinic as outlined.  5. Symptomatic bradycardia, status post pacemaker implantation in      August 2006.  He will follow up with Dr. [name] as indicated.   DISPOSITION:  The patient will brought back in followup with Dr.  Dietrich Pates in the next 6 months or sooner p.r.n.      [name], PA-C  Electronically Signed      Gerrit Friends. Dietrich Pates, MD, [facility]  Electronically Signed   SW/MedQ  DD: 06/07/2008  DT: 06/08/2008  Job #: [id number]   cc:   Patrica Duel, M.D.

## 2010-05-27 NOTE — Telephone Encounter (Signed)
Pt needs to know how many days he needs to be off coumadin prior to getting a tooth pulled

## 2010-05-30 NOTE — Op Note (Signed)
NAME:  GEORG, ANG NO.:  [id number]   MEDICAL RECORD NO.:  [id number]          PATIENT TYPE:  INP   LOCATION:  3106                         FACILITY:  [facility]   PHYSICIAN:  Payton Doughty, M.D.      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   OPERATIVE PROCEDURE:  Occiput to C5 posterior cervical fusion.   SURGEON:  Payton Doughty, M.D.   ANESTHESIA:  General endotracheal.   PREP:  Betadine prep scrub with alcohol wipe.   COMPLICATIONS:  None.   NURSE ASSISTANT:  [place].   DOCTOR ASSISTANT:  Phoebe Perch.   This is a 77 year old gentleman with severe cervical spondylosis and  instability at C1-C2 with the pannus at C2.  Taken operating room  smoothly anesthetized, intubated, placed prone on the Stryker frame.  Following shave, prep, drape in the usual sterile fashion.  The skin was  infiltrated with 1% lidocaine with 1:400,000 epinephrine.  The skin was  incised from the inion down to the spinous process of C6. The occipital  squama and the lamina of C1, C2, C3, C4 and C5 were exposed bilaterally  in the subperiosteal plane.  Lateral mass screws were then placed in C3,  C4 and C5.  Translaminar screws were placed in C2.  Then an occipital  plate was placed on the occiput.  A rod was contoured to fit the plate.  The rod was attached to both the plate and the screws that had  previously been placed.  Side extender attachments were needed at C2.  Following placement of the rod intraoperative x-ray showed good  placement of the rods and screws.  The lamina of C1 was removed to  decompress the canal at that level.  High-speed drill was then used to  decorticate the lamina and facet joints.  They were packed with  BMP2 on  an extender matrix.  Prior to this the wound had been irrigated.  Hemostasis assured.  Successive layers of 0 Vicryl, 2-0 Vicryl and 3-0  nylon were used to close the wound.  Betadine Telfa dressing was  applied.  The patient made occlusive with OpSite and the patient  returned to recovery room after being placed in a Aspen collar.           ______________________________  Payton Doughty, M.D.     MWR/MEDQ  D:  12/22/2005  T:  12/23/2005  Job:  [id number]

## 2010-05-30 NOTE — H&P (Signed)
NAME:  [name], [name] NO.:  [id number]   MEDICAL RECORD NO.:  [id number]                   PATIENT TYPE:  INP   LOCATION:  3012                                 FACILITY:  [facility]   PHYSICIAN:  Payton Doughty, M.D.                   DATE OF BIRTH:  1932-09-15   DATE OF ADMISSION:  11/18/2001  DATE OF DISCHARGE:                                HISTORY & PHYSICAL   ADMISSION DIAGNOSES:  Spondylosis of L4-5 and L5-S1.   INDICATIONS FOR SURGERY:  The patient is an elderly 77 year-old right handed  white gentleman who had a decompressive laminectomy done about a year ago.  He did reasonably well.  When he began running again, he began experiencing  pain in his back and down his left leg.  A repeat MR did not show any disk,  but he still has lateral recess narrowing.  He is now admitted for  laminectomy, diskectomy and posterior lumbar interbody fusion at L4-5 and L5-  S1.   PAST MEDICAL HISTORY:  1. Hypertension.  2. Gout.   MEDICATIONS:  1. Lotensin 20 mg a day.  2. Amlodipine 5 mg a day.  3. Hydrochlorothiazide 25 mg a day.  4. Allopurinol 300 mg a day.  5. Aspirin a day.  6. Simvastatin 40 mg a day.   ALLERGIES:  He has no allergies.   PAST SURGICAL HISTORY:  1. Three back operations by Dr. Roxan Hockey in the seventies and eighties at L5-     S1.  2. Heart operation in 1986.  3. Cholecystectomy.  4. Appendectomy.  5. Two knee operations on the left.   SOCIAL HISTORY:  He does not smoke and does not drink. He is an avid runner.   FAMILY HISTORY:  His Mom is 89 and in good health.  Father is deceased.   REVIEW OF SYMPTOMS:  Remarkable for hypertension, hypercholesterolemia,  glasses, hearing loss and skin cancers which are basal cell.   PHYSICAL EXAMINATION:  HEENT:  Within normal limits.  He has reasonable  range of motion of the neck.  CHEST:  Clear.  CARDIAC:  Regular rate and rhythm.  ABDOMEN:  Non-tender.  No hepatosplenomegaly.  EXTREMITIES:  No clubbing or cyanosis.  Peripheral pulses are good.  GU EXAM:  Deferred.  NEUROLOGIC:  He is awake, alert and oriented.  His cranial nerves are  intact.  Motor exam demonstrates 5/5 strength throughout the lower  extremities.  He has pain with hip flexibility and with knee flexion.  Straight leg raise is positive on the left.  There are mild signs of hip  irritability.   MR demonstrates facet arthropathy worse at L4-5 and L5-S1.  He failed an  epidural steroid injection.   PLAN:  He is now admitted for a lumbar laminectomy, diskectomy, posterior  lumbar interbody fusion at L4-5 and L5-S1.  The risks and benefits of  this  approach have been discussed with him and he wishes to proceed.                                                Payton Doughty, M.D.    MWR/MEDQ  D:  11/18/2001  T:  11/19/2001  Job:  [id number]

## 2010-05-30 NOTE — H&P (Signed)
NAME:  [name], [name] NO.:  [id number]   MEDICAL RECORD NO.:  [id number]                   PATIENT TYPE:  EMS   LOCATION:  ED                                   FACILITY:  [facility]   PHYSICIAN:  Sarita Bottom, M.D.                  DATE OF BIRTH:  24-May-1932   DATE OF ADMISSION:  03/24/2002  DATE OF DISCHARGE:  03/24/2002                                HISTORY & PHYSICAL   PRIMARY CARE PHYSICIAN:  Kirk Ruths, M.D.   CHIEF COMPLAINT:  I have chills.   HISTORY OF PRESENT ILLNESS:  The patient is a 77 year old man with a history  of hypertension.  He was well until yesterday when he had an insidious onset  of fever and chills.  He came to the emergency room where he was treated for  a urinary tract infection and discharged.  He was given Bactrim orally.  He  was well until today when the chills recurred.  He decided, therefore, to  come to the emergency room again for re-evaluation and for treatment.  In  the ED his labs were significant for hyperkalemia.  The patient was then  given IV Kay Ciel and oral potassium.   REVIEW OF SYSTEMS:  He admits to weakness, admits to fevers and chills.  Denies any cough or shortness of breath.  Denies any nausea or vomiting.  Admits to constipation.  Denies any lightheadedness or dizziness.  He admits  to slight dysuria.  He has hesitancy of urine and has a poor urinary stream.   PAST MEDICAL HISTORY:  1. He has hypertension.  2. He has arthritis.  3. He has a history of gastroesophageal reflux disease.  4. He has myocardial infarction.  5. He is status post coronary artery bypass grafting 20 years ago.  6. He was told that he has a calcified mass in his prostate some years ago.   MEDICATIONS:  1. Norvasc 5 mg.  2. Lisinopril 20 mg.  3. Zocor 40 mg.  4. Allopurinol 300 mg.  5. Hydrochlorothiazide 25 q.d.  6. Aspirin 325.   ALLERGIES:  He has no known drug allergies.   FAMILY HISTORY:  None of  significance.   SOCIAL HISTORY:  He is married with 1 child.  He does not smoke or drink  alcohol.   PHYSICAL EXAMINATION:  VITAL SIGNS:  Blood pressure is 142/76, heart rate of 68, temperature is 99.  GENERAL:  An elderly man not in any distress.  He is febrile.  He is lying  comfortably in bed.  HEENT:  He is not pale. He is anicteric.  He appears flushed.  Pupils are  equal, round and reactive to light and accommodation.  NECK:  Supple.  No lymphadenopathy.  No thyromegaly.  No jugular venous  distention.  CHEST:  Air entry is good bilaterally.  Breath sounds are fascicular.  He  has a surgical sternotomy scar on his anterior chest wall.  CVS:  His heart sounds 1 and 2 are normal.  Rhythm is regular.  ABDOMEN:  Soft.  Bowel sounds are present.  No masses on deep palpation.  He  has a slight suprapubic tenderness on palpation.  CNS:  He is alert and oriented x3.  He has no focal deficits.  EXTREMITIES:  He has no edema.  Pulses are 2+ bilaterally.  RECTAL:  As per ED physician, negative for coag.  No mention of prostate  size.   LABORATORIES AND DIAGNOSTICS:  WBC is 12.9, hemoglobin is 14, hematocrit is  40.8, MCV is 92.6, platelet count is 124.  Neutrophils 87%.  Sodium is 132,  potassium is 3.7, chloride is 94, BUN of 25, creatinine of 1.4, glucose is  125, calcium is 8.9.   ASSESSMENT AND PLAN:  1. Urinary tract infection.  The patient has been started on IV Levaquin in     the emergency room.  I will continue with this antibiotic.  Also given     Tylenol 650 q.6h for the fever and chills.  2. For the hyperkalemia I will replenish and monitor his basic metabolic     panel tomorrow morning.  3. With the hyponatremia, it is probably secondary to volume depletion.  The     patient will be given normal saline IV and I will check his basic     metabolic panel tomorrow.  4. For the hypertension, the patient will be continued on his regular     medication which includes Norvasc,  Lisinopril and hydrochlorothiazide.  5. For the hesitancy and poor urinary stream, I will check his PSA at this     time and the patient might need urological evaluation as an outpatient.  6. For the gout, the patient will be continued on his Allopurinol at 300     q.d.  7. Hyperlipidemia.  The patient will continue on his Zocor.  8. With the coronary artery disease the patient will be maintained on his     aspirin.  9. The patient to be admitted under the care of primary M.D., Dr. Regino Schultze.     Further workup and management will depend on clinical course.                                                  Sarita Bottom, M.D.    DW/MEDQ  D:  03/25/2002  T:  03/25/2002  Job:  [id number]

## 2010-05-30 NOTE — H&P (Signed)
NAMEERHARD, SENSKE NO.:  [id number]   MEDICAL RECORD NO.:  [id number]          PATIENT TYPE:  INP   LOCATION:  A306                          FACILITY:  [facility]   PHYSICIAN:  Kirk Ruths, M.D.DATE OF BIRTH:  05/20/1932   DATE OF ADMISSION:  DATE OF DISCHARGE:  LH                                HISTORY & PHYSICAL   CHIEF COMPLAINT:  Severe neck pain.   HISTORY OF PRESENT ILLNESS:  This is a 77 year old male who presented to the  office three days before this admission with torticollis with no known  injuries.  The patient at the time was noted to have severe spasm of the  left posterior neck, was treated with IM steroid injection, as well as  __________10 and Valium 10.  The patient was back in the office today,  stating the pain is worse, that the medication makes him drowsy but does not  help his pain.  Now pain is involving the right side also.  He denies  significant radiculopathy with paresthesia or weakness.  He will be admitted  for pain control per IV as well as MRI.   PAST MEDICAL HISTORY:  1. He is status post CABG for coronary artery disease.  2. He has hypertension, controlled.  3. Hyperlipidemia, controlled.  4. Gout.  5. He has had six back surgeries per Dr. Trey Sailors.  6. The patient has a history of chronic atrial fibrillation and indwelling      pacemaker.  He is followed by [facility].   ALLERGIES:  He is allergic to no medications.   MEDICATIONS:  1. Simvastatin 40 mg daily.  2. Allopurinol 300 daily.  3. Aspirin daily.  4. HCTZ 25 daily.  5. Lisinopril 25 daily.  6. Sular 10 mg  daily.  7. Potassium 20 mEq daily.  8. He is on Coumadin 5 mg daily.   SOCIAL HISTORY:  He is a nonsmoker, nondrinker, non drug abuser.  He is  retired from [employer].   REVIEW OF SYSTEMS:  He denies chest pain, paresthesia, weakness, shortness  of breath, nausea, vomiting, diarrhea, or headache.   PHYSICAL EXAMINATION:  GENERAL:   Elderly male who appears miserable.  VITAL SIGNS:  Pulse is 70 and regular, blood pressure 130/80.  He is  afebrile.  Respirations are 20 and unlabored.  HEENT:  TMs are normal.  Pupils equal.  __________  Oropharynx benign.  NECK:  With severe spasms.  Limited range of motion.  No thyromegaly or JVD.  LUNGS:  Clear in all areas.  HEART:  With a regular sinus rhythm without murmur, gallop, or rub.  ABDOMEN:  Soft and nontender.  EXTREMITIES:  Without clubbing, cyanosis, or edema.  NEUROLOGIC:  Exam is grossly intact.  Motor:  Reflexes are normal and  bilaterally equal.   ASSESSMENT:  1. Severe torticollis.  2. History of coronary artery disease.  3. History of degenerative disk disease with six surgeries.  4. Hypertension.  5. Hyperlipidemia.      Kirk Ruths, M.D.  Electronically Signed     WMM/MEDQ  D:  10/16/2005  T:  10/17/2005  Job:  [id number]

## 2010-05-30 NOTE — H&P (Signed)
NAME:  [name], [name] NO.:  [id number]   MEDICAL RECORD NO.:  [id number]          PATIENT TYPE:  INP   LOCATION:  3106                         FACILITY:  [facility]   PHYSICIAN:  Payton Doughty, M.D.      DATE OF BIRTH:  06-07-1932   DATE OF ADMISSION:  12/22/2005  DATE OF DISCHARGE:                              HISTORY & PHYSICAL   ADMITTING DIAGNOSIS:  Cervical spondylosis at C1-2 and C4-5.   This is a now a 77 year old right-handed white gentleman who has had  several lumbar fusions, has done reasonably well then started about two  to three months ago began having increased neck pain and pain down both  arms.  Myelography was done that demonstrates a panus posterior to C1-2  with posterior displacement of the cord and stenosis at 4-5 and he is  now admitted for posterior stabilization and fusion.   MEDICAL HISTORY:  Remarkable for coronary artery disease.  He is on  Lotensin, amlodipine and hydrochlorothiazide.  He has had a bypass in  his heart, he has a pacemaker in.  He also has gout for which he is on  allopurinol and aspirin.   SOCIAL HISTORY:  Does not smoke, does not drink and is retired as an  avid Academic librarian.   SURGICAL HISTORY:  Three back operations, a heart operation,  cholecystectomy, appendectomy and two knee operations on the left.   FAMILY HISTORY:  Mom is 60 in good health, dad is deceased, history is  not given.   REVIEW OF SYSTEMS:  Remarkable for hypertension and  hypercholesterolemia, glasses, hearing loss and skin cancers.   His HEENT exam is normal limits.  He has very limited range of motion in  his neck and occasionally has a Lhermitte's.  Chest is clear.  Cardiac  exam is regular rate and rhythm at this time.  Abdomen is nontender, no  hepatosplenomegaly.  Extremities are without clubbing or cyanosis.  GU  exam is deferred.  Peripheral pulses are good.  Neurologically, he is  awake, alert and oriented.  His cranial nerves are intact.   Motor exam  shows 5/5 strength throughout the upper and lower extremities, has  mildly positive Hoffmann's bilaterally and occasional Lhermitte's with  neck flexion.   CLINICAL IMPRESSION:  Cervical spondylosis and compression at C1-2.   The plan is for occiput to C5 fusion.  The risks and benefits of this  approach have been discussed with him and he wishes to proceed.           ______________________________  Payton Doughty, M.D.    MWR/MEDQ  D:  12/22/2005  T:  12/23/2005  Job:  [id number]

## 2010-05-30 NOTE — Op Note (Signed)
[place]  Patient:    [name], [name] Visit Number: [id number] [name]: [id number]          Service Type: SUR Location: [id number] Attending Physician:  Emeterio Reeve Dictated by:   Payton Doughty, M.D. Proc. Date: 11/30/00 Admit Date:  11/30/2000                             Operative Report  PREOPERATIVE DIAGNOSIS:  Spinal stenosis, L4-5.  POSTOPERATIVE DIAGNOSIS:  Spinal stenosis, L4-5.  OPERATION PERFORMED:  L4-5 laminectomy.  SURGEON:  Payton Doughty, M.D.  ANESTHESIA:  General endotracheal.  PREP:  Sterile Betadine prep and scrub with alcohol wipe.  COMPLICATIONS:  None.  ASSISTANT:  None.  DESCRIPTION OF PROCEDURE:  The patient is a 77 year old gentleman with radicular claudication and spinal stenosis at L4-5.  The patient was taken to the operating room, smoothly anesthetized and intubated, and placed prone on the operating table.  Following shave, prep and drape in the usual sterile fashion the skin was infiltrated with 1% lidocaine with 1:400,000 epinephrine. The skin was incised from the bottom of L3 to the mid-L5.  The lamina of L4 and L5 were exposed bilaterally in the subperiosteal lane.  Intraoperative x-ray confirmed correctness of level.  Total laminectomy of L4 was carried out.  There was abundant redundant ligamentum flavum with significant compression of the left L5 root.  Complete decompression was carried out bilaterally and extensive exploration of both neural foramina of L5 was carried out.  There was disk at 4-5 that was bulging.  It had slightly elevated the root, but once the ligamentum flavum was removed.  There was no longer any root compression.  It was therefore elected not to perform the diskectomy.  The wound was irrigated and hemostasis assured.  The paraspinous muscles and fascia were reapproximated with 0 Vicryl in interrupted fashion. Subcutaneous tissues reapproximated with 0 Vicryl in interrupted  fashion. Subcuticular tissues reapproximated with 3-0 Vicryl in interrupted fashion. Skin was closed with 3-0 nylon in running locked fashion.  Betadine and Telfa dressing was applied and made occlusive with Op-Site.  The patient returned to recovery room in good condition. Dictated by:   Payton Doughty, M.D. Attending Physician:  Emeterio Reeve DD:  11/30/00 TD:  11/30/00 Job: [id number]

## 2010-05-30 NOTE — Consult Note (Signed)
NAME:  [name], [name] NO.:  [id number]   MEDICAL RECORD NO.:  [id number]          PATIENT TYPE:  INP   LOCATION:  A306                          FACILITY:  [facility]   PHYSICIAN:  Edward L. Juanetta Gosling, M.D.DATE OF BIRTH:  [birth year]   DATE OF CONSULTATION:  DATE OF DISCHARGE:                                   CONSULTATION   [name] is a patient of Dr. Edison Simon, and consult is requested because of  abnormal chest CT.  [name] was actually admitted with severe neck pain.  He had been seen there in Dr. Edison Simon office with torticollis, but no  known injuries.  He was having severe spasm of his left neck.  He had been  treated with multiple medications, but he is worse.  His pain is on the left  and the right side.   PAST MEDICAL HISTORY:  Positive for:  1. Coronary disease status-post coronary artery bypass grafting.  2. Hypertension.  3. Hyperlipidemia.  4. Gout.  5. Multiple back surgeries by Dr. Trey Sailors.  6. History of chronic atrial fibrillation.  7. A chronic pacemaker.  8. He says that he has no known history of any sort of lung disease.  He      did smoke, but stopped more than 25 years ago.   MEDICATIONS ON ADMISSION:  1. Simvastatin 40 mg daily.  2. Allopurinol 300 mg daily.  3. Aspirin daily.  4. HCTZ daily.  5. Lisinopril 25 mg daily; I am not sure about that dose.  6. Sular 10 mg daily.  7. Potassium chloride 20 mEq daily.  8. Coumadin 5 mg daily.   SOCIAL HISTORY:  He does not smoke, but did.  Stopped 25 years ago, as  mentioned.  He does not drink any alcohol.  He does not use any other drugs.   REVIEW OF SYSTEMS:  Otherwise, he does not have any chest pain, cough,  sputum production, or hemoptysis.   PHYSICAL EXAMINATION:  GENERAL:  Shows an elderly male who looks fairly  comfortable.  He denies any chest symptoms.  VITAL SIGNS:  His temperature is 98, pulse 63, respirations 28, blood  pressure 128/65, O2 sats 95% on room air.  HEENT:   His pupils are reactive to light and accommodation.  His nose and  throat are clear.  NECK:  Still very stiff, but without significant torticollis.  CHEST:  Actually quite clear.  HEART:  Irregular without gallop.  ABDOMEN:  Soft.  Bowel sounds are present and active.  EXTREMITIES:  Show no clubbing, cyanosis, or edema.  CNS:  Exam is grossly intact.   LABORATORY WORK:  White count 5400.  Hemoglobin is 11.8, platelets 180,000.  Pro time 30.1 with an INR of 2.7.  Electrolytes are normal with the  exception of a glucose of 108 and a chloride of 104.  Albumin is 2.5.  Chest  CT shows what looks like a pneumonia.  He did have some temperature to 102.   I think we should go ahead and treat him for pneumonia and follow this to  conclusion.  If  he was to have a lung biopsy, he would need to come off of  his Coumadin.  There is nothing specifically about the CT that suggests that  this is a malignancy.   Thank you very much for allowing me to see him with you.      Edward L. Juanetta Gosling, M.D.  Electronically Signed     ELH/MEDQ  D:  10/19/2005  T:  10/19/2005  Job:  [id number]

## 2010-05-30 NOTE — H&P (Signed)
NAME:  [name], [name] NO.:  [id number]   MEDICAL RECORD NO.:  [id number]          PATIENT TYPE:  INP   LOCATION:                               FACILITY:  [facility]   PHYSICIAN:  Payton Doughty, M.D.      DATE OF BIRTH:  1932-11-02   DATE OF ADMISSION:  09/02/2004  DATE OF DISCHARGE:                                HISTORY & PHYSICAL   ADMISSION DIAGNOSIS:  Lumbar spondylosis L2-L3 and L3-L4.   This is a now 77 year old right-handed white gentleman who has had a lumbar  fusion at L4-L5 and L5-S1 who has done well, who has developed a  transitional syndrome at L2-L3 and L3-L4 with intractable back and leg pain.  Several weeks ago he was admitted for a fusion, but after induction of  anesthesia he developed profound bradycardia, visited the cardiologist, got  a pacemaker and now presents back for his lumbar fusion.   MEDICAL HISTORY:  Remarkable for hypertension and is on:  1.  Lotensin.  2.  Amlodipine.  3.  Hydrochlorothiazide.   He has gout for which he is on:  1.  Allopurinol.  2.  Aspirin per day.   SURGICAL HISTORY:  He has had three back operations, a heart operation in  1986, a cholecystectomy, an appendectomy and two knee operations on the  left.   SOCIAL HISTORY:  He does not smoke, does not drink and is retired.  He is an  avid athlete.   FAMILY HISTORY:  Mom is 91 and in good health, dad is deceased, history is  not given.   REVIEW OF SYSTEMS:  Remarkable for hypertension, hypercholesterolemia,  glasses, hearing loss, skin cancers.   HEENT:  Within normal limits.  He has __________ range of motion of the  neck.  CHEST:  Very clear.  CARDIAC:  Exam currently is a regular rate and rhythm with a pacer in.  ABDOMEN:  Nontender, no hepatosplenomegaly.  EXTREMITIES:  Without clubbing or cyanosis.  GU:  Exam deferred.  Peripheral pulses are good.  NEUROLOGIC:  He is awake, alert and oriented.  Cranial nerves are intact.  Motor exam reveals 5/5 strength  throughout the upper and lower extremities  except for the dorsiflexors on the left which are 5-/5 and the knee  extensors on the left which are 5-/5.  Sensory dysesthesias are described in  the left L4 and L5 distribution.  Reflexes are flicker at the knees, absent  at the ankles.   MR demonstrates a severe spondylosis at L2-L3 and L3-L4.   CLINICAL IMPRESSION:  Lumbar spondylosis with transitional segment disease  at L2-L3 and L3-L4.   PLAN:  Decompression and fusion with Ray cage at those levels, then an  inclusion with pedicle screw fixation from L2 to S1.  The risks and benefits  of this approach have been discussed with him and he wishes to proceed.           ______________________________  Payton Doughty, M.D.     MWR/MEDQ  D:  09/02/2004  T:  09/02/2004  Job:  [id number]

## 2010-05-30 NOTE — Consult Note (Signed)
   NAME:  [name], [name]                         ACCOUNT NO.:  [id number]   MEDICAL RECORD NO.:  [id number]                   PATIENT TYPE:  INP   LOCATION:  A313                                 FACILITY:  [facility]   PHYSICIAN:  Patrica Duel, M.D.                 DATE OF BIRTH:  02-15-1932   DATE OF CONSULTATION:  DATE OF DISCHARGE:  03/28/2002                                   CONSULTATION   DISCHARGE DIAGNOSES:  1. Fever, questionable etiology, possible brief septic episode secondary to     urinary tract infection.  All cultures negative.  Afebrile x24 hours.  2. Hypokalemia, corrected.  3. History of gastroesophageal reflux disease.  4. History of arteriosclerotic cardiovascular disease status post coronary     artery bypass grafting 20 years ago.  5. History of hypertension.   HISTORY OF PRESENT ILLNESS:  Details regarding admission, please refer to  the chart for this 76 year old male with the history noted above.  Had been  doing well until he presented to the emergency department with onset of  fever and chills. He had a urinary tract infection and was given Bactrim and  was discharged.  He did well until the day of admission when his chills  recurred.  He describes true rigor.  He presented back to the emergency  department, cultures were obtained and he was admitted for IV antibiotics  for further evaluation as indicated.   COURSE IN THE HOSPITAL:  The patient was treated with Levaquin IV.  He was  pancultured and these are negative x48 hours.  Urine culture is also  negative (the patient had been on antibiotics x2 days).   Currently the patient is doing well.  He feels much better.  He has had no  fever in over 24 hours and is stable for discharge at this time.   DISCHARGE MEDICATIONS:  1. Norvasc 5 mg.  2. Lisinopril 20 mg.  3. Zocor 40 mg.  4. Allopurinol 300 mg.  5. Hydrochlorothiazide 25 mg.  6. Aspirin 325 daily.  7. Levaquin 500 mg daily x10 days.   DISPOSITION:  He will be followed up and treated as an outpatient.                                               Patrica Duel, M.D.    MC/MEDQ  D:  03/28/2002  T:  03/28/2002  Job:  [id number]

## 2010-05-30 NOTE — Group Therapy Note (Signed)
NAME:  DOMANI, BAKOS NO.:  [id number]   MEDICAL RECORD NO.:  [id number]          PATIENT TYPE:  INP   LOCATION:  A306                          FACILITY:  [facility]   PHYSICIAN:  Edward L. Juanetta Gosling, M.D.DATE OF BIRTH:  [birth year]   DATE OF PROCEDURE:  DATE OF DISCHARGE:  10/21/2005                                   PROGRESS NOTE   Mr. Whetsell is being discharged, which I think is appropriate and I will of  course sign off at this point. He will need follow-up chest x-rays until his  chest x-ray totally clears.      Edward L. Juanetta Gosling, M.D.  Electronically Signed     ELH/MEDQ  D:  10/21/2005  T:  10/22/2005  Job:  [id number]

## 2010-05-30 NOTE — Op Note (Signed)
NAME:  [name], [name] NO.:  [id number]   MEDICAL RECORD NO.:  [id number]                   PATIENT TYPE:  INP   LOCATION:  3012                                 FACILITY:  [facility]   PHYSICIAN:  Payton Doughty, M.D.                   DATE OF BIRTH:  [birth year]   DATE OF PROCEDURE:  DATE OF DISCHARGE:  11/20/2001                                 OPERATIVE REPORT   PREOPERATIVE DIAGNOSIS:  Spondylosis L4-L5, L5-S1.   POSTOPERATIVE DIAGNOSIS:  Spondylosis, L4-L5, L5-S1.   PROCEDURE:  L4-L5 laminectomy, diskectomy, posterior lumbar interbody fusion  with a right side infusion cage and L5-S1 laminectomy and fasciectomy.  There is also a posterior lateral arthrodesis from L3-S1 and segmental  pedicle screw instrumentation with 90 degree instrumentation from L4-S1.   SURGEON:  Payton Doughty, M.D.   ANESTHESIA:  General endotracheal.   ASSISTANT:  Hewitt Shorts, M.D.   NURSING ASSISTANT:  [place]   PREPARATION:  Prepped with Betadine and scrubbed with alcohol wipe.   COMPLICATIONS:  None.   INDICATIONS:  This is a 77 year old gentleman with severe lumbar spondylosis  and gait.   DESCRIPTION OF PROCEDURE:  He was taken to the operating room, smoothly  anesthetized and intubated and placed prone on the operating table.  Following shave, prep and drape in the usual sterile fashion, the skin was  infiltrated with 1% lidocaine with 1:400,000 epinephrine.  His old skin  incision was reopened basically from the bottom of L3 to the bottom of S1  and the lamina.  The remaining lamina of L4, L5, and S1 were exposed  bilaterally as well as the transverse processes of L4, L5 and S1.  The  previous decompressive laminectomy was identified and the pars intra-  articularis was drilled down.  The remaining lamina and inferior facet of L4  removed.  The remaining lamina and pars intra-articularis and facet of L5  were also removed as well as the superior facet of  L5 and the superior facet  of S1.  At L4-L5 on the left side, there was severe gouty arthritis  infiltrating the entire joint significantly compressing the L4 root as well  as the L5 root.  The L5-S1 joint was similarly affected except at the site  of the old laminectomy there was virtually no room for the L5 nerve root to  egress.  This was completely decompressed by removing the facet joints.  The  right side was slightly less affected.  There was complete ankylosis of the  L5-S1 interspace.  Because it was not feasible to place an interbody device  therefore a generous decompression was carried out completely laterally for  the L5 root as well as and S1 foraminotomy.  Ray cages at 14 x 21 mm were  placed at L4-5.  Pedicle screws were then placed at L4, L5, and S1  bilaterally, and  using the landmarks intraoperative x-ray confirmed good  placement.  They were hooked together with a bar and cap.  Posterior lateral  bone was placed across to create an a transverse fusion after decortication  of the transverse processes.  Intraoperative x-ray showed good placement of  the screws, Ray cages and rods.  Final tightening was completed.  The wound  was irrigated and hemostasis assured.  The fascia was reapproximated with 0  Vicryl in an interrupted fashion.  Subcutaneous tissues were reapproximated  with 0 Vicryl in an interrupted fashion.  Subcuticular tissues were  reapproximated with 3-0 Vicryl interrupted fashion.  The skin was closed  with 3-0 nylon in a running locking fashion.  Bacitracin and Telfa dressing  were applied and made occlusive with Op-Site, and the patient returned to  the recovery room in good condition.                                               Payton Doughty, M.D.    MWR/MEDQ  D:  11/18/2001  T:  11/20/2001  Job:  [id number]

## 2010-05-30 NOTE — Op Note (Signed)
NAMELIEV, BROCKBANK NO.:  [id number]   MEDICAL RECORD NO.:  [id number]          PATIENT TYPE:  OIB   LOCATION:  3730                         FACILITY:  [facility]   PHYSICIAN:  Doylene Canning. [name], M.D.  DATE OF BIRTH:  12-12-1932   DATE OF PROCEDURE:  08/12/2004  DATE OF DISCHARGE:                                 OPERATIVE REPORT   PROCEDURE PERFORMED:  Implantation of a dual-chamber pacemaker.   INDICATIONS:  Symptomatic bradycardia in the setting of persistent atrial  fibrillation.   INTRODUCTION:  The patient is a 77 year old male with a history of  persistent atrial fibrillation who has a history of arthritic problems of  the spine, was set to undergo spinal surgery when he was subsequently found  to become severely bradycardic with heart rates in the 20s and hypotension.  This improved with atropine and the patient was discharged home and is now  here for implantation of a dual-chamber pacemaker secondary to his  bradycardia. Of note, he has a history ischemic heart disease and EF of 40-  45%.   PROCEDURE:  After informed consent was obtained, the patient was taken to  the diagnostic EP lab in fasting state. After usual preparation, draping,  intravenous fentanyl Midazolam was given for sedation. 30 cc lidocaine was  infiltrated in the left infraclavicular region.  A 5 cm incision was carried  over this region. Electrocautery utilized dissect down to the fascial plane.  The Guidant model [phone] active fixation RV pacing lead was advanced by  way of the left subclavian vein and then into the right ventricle. Initially  a Guidant Flextend model [id number] active fixation pacing lead was placed in the  right atrium. Mapping was carried out in the right ventricle. At the final  site R-waves measured 11 mV.  The pacing impedance was 616 ohms with a  pacing threshold 0.5 volts of 0.5 milliseconds with the lead actively fixed.  10 volts pacing did not stimulate the  diaphragm. Mapping was then carried  out with the Guidant active fixation pacing lead in the right atrium.  Multiple acceptable sites were selected and the lead was actively fixed.  However, each time the lead spontaneously dislodged. After this had occurred  on five separate occasions, the Guidant model 4087 active fixation lead was  removed and the Medtronic model 5076 52-cm active fixation pacing lead  serial number [id number] was advanced into the right atrium. Mapping was  carried out in the right atrium and at the final site in the right atrium P-  waves defibrillation waves measured 1.5 mV and the pace impedance was 527  ohms, lead actively fixed. 10 volts pacing did not stimulate the diaphragm  in this location. This lead, however did not dislodge when the stylet was  removed. At this point the leads were secured to the subpectoralis fascia  with a figure-of-eight silk sutures and the sewing sleeve was secured with  silk suture. Electrocautery utilized to make a subcutaneous pocket.  Kanamycin irrigation was utilized to irrigate the pocket. The Guidant  Insignia 1 UltraDR model 1291 dual-chamber pacemaker serial  number [id number]  was connected to the right atrium and right ventricular pacing leads and  placed in the subcutaneous pocket. Generator secured with silk suture. The  incision was closed with layer of 2-0 Vicryl followed by layer 3-0 Vicryl  followed by layer 4-0 Vicryl.  Benzoin was painted on the skin and Steri-  Strips were applied and a pressure dressing was placed and the patient was  returned to his room in satisfactory condition.   COMPLICATIONS:  There were no immediate procedure complications.   RESULTS:  This demonstrates successful implantation of a Guidant dual-  chamber pacemaker in a patient with ischemic heart disease and symptomatic  atrial fibrillation with symptomatic bradycardia. Of note, because of the  patient's symptomatic a.fib, it will be anticipated  that he will be  initiated on amiodarone with plans for DC cardioversion in the future.       GWT/MEDQ  D:  08/12/2004  T:  08/13/2004  Job:  [id number]   cc:   Duke Salvia, M.D.   Payton Doughty, M.D.  [address]  [place] [address]  Fax: [phone]   Vidalia Bing, M.D.   Patrica Duel, M.D.  [address], [address]  [place]  [place] [address]  Fax: [phone]

## 2010-05-30 NOTE — Discharge Summary (Signed)
NAME:  [name], HAIG NO.:  [id number]   MEDICAL RECORD NO.:  [id number]                   PATIENT TYPE:  INP   LOCATION:  2903                                 FACILITY:  [facility]   PHYSICIAN:  Delbert Harness, MD              DATE OF BIRTH:  [birth year]   DATE OF ADMISSION:  10/01/2002  DATE OF DISCHARGE:  10/03/2002                                 DISCHARGE SUMMARY   DISCHARGE DIAGNOSES:  1. Acute coronary syndrome.  2. Hypertension.  3. Hyperlipidemia.  4. Gout.  5. Hypokalemia.  6. Coronary artery disease with coronary artery bypass graft approximately     20 years ago.   DISCHARGE MEDICATIONS:  1. Norvasc 5 mg every day.  2. Lisinopril 20 mg every day.  3. Zocor 40 mg q.h.s.  4. Allopurinol 300 mg every day.  5. HCTZ 25 mg every day.  6. Aspirin 325 mg every day.   PROCEDURE:  October 03, 2002, Cardiac catheterization:  Findings:  Left  ventricular ejection fraction 60% with mid inferior akinesis.  The LAD is a  large vessel giving rise to two large diagonal branches.  There is a 50%  stenosis at the proximal vessel extending into the first obtuse marginal.  The LAD is occluded after the take off of the first marginal.  The saphenous  vein graft to the mid LAD is patent widely.  This vessel fills a second  diagonal in a retrograde fashion.  There is a 90% stenosis of the LAD just  after the second diagonal which limits the __________  of that diagonal.  Circumflex, large vessel, giving rise to three obtuse marginals.  There is a  40% stenosis of A-V groove circumflex after the takeoff of the small first  marginal.  The second marginal is occluded at __________  vein graft and  this marginal is occluded at it's ostium.  The second marginal robustly  collateralized from the RCA.  RCA is occluded proximally.  The saphenous  vein graft at the PDA is widely patent with no significant stenosis.  There  is 40% stenosis at the PDA just distal  to the touch down at that graft.  Renal arteries bilaterally, the artery on the left is normal.  There is a  50% stenosis of the right renal artery.  There is no associated gradient  when measured using a 6 French catheter.  Impression/Recommendation:  Patent  saphenous vein grafts to the left anterior descending and right coronary  arteries.  Occluded saphenous vein graft to the obtuse marginal, the obtuse  marginal is well collateralized from the right coronary artery.  Normal left  ventricular size and systolic function.  Mild renal artery stenosis on the  right.   HISTORY OF PRESENT ILLNESS:  The patient is a 77 year old white male with  known coronary artery disease, status post CABG approximately 20 years ago,  who presented with complaints of  chest pain.  He reports the pain began  September 30, 2002, at 5:30 p.m. and lasted for several hours.  The pain was  described as a substernal tightness/pressure, rated as a 4/10.  The pain  returned later in the evening and that was described as a sharp left sided  chest pain, rated as 8/10.  It lasted throughout the night, and the patient  reported to [facility] early that a.m.  The pain was associated  with radiation to left arm and mild diaphoresis.  No nausea or vomiting.  No  shortness of breath.  Positive blurry vision with the first episode.  The  patient states the sharp pain is different from his three previous heart  attacks.  The patient was given nitroglycerin with some relief at [facility].  The pain, on admission to [facility], was 4/10.  Blood  pressure was noted to be 213/86 in [facility].   PHYSICAL EXAMINATION:  VITAL SIGNS:  Temperature 97.3, blood pressure  140/70, pulse 50, respirations 16, O2 100% on 2 liters.  GENERAL:  Trim, pleasant man in no acute distress.  NECK:  No JVD.  No carotid bruits.  CARDIAC:  Regular, rate and rhythm.  No murmurs, rubs or gallops.  Fourth  heart sounds  present.  LUNGS:  Clear to auscultation.  ABDOMEN:  Soft, nontender, nondistended.  Positive bowel sounds.  EXTREMITIES:  Good distal pulses.  No edema.  NEURO:  No focal deficits.   ADMISSION LABS:  WBC 7.3, hemoglobin 15.3, platelets 211.  CK 161, CK-MB  6.5, relative index 4.0, troponin 0.09.  Sodium 138, potassium 3.4, chloride  102, bicarb 30, BUN 17, creatinine 0.9, glucose 108.  PT 12.9, INR 1.0, PTT  31.  D-dimmer 0.60.  LFTs were within normal limits.   EKG:  Sinus rhythm, bradycardic at 44 beats per minute with occasional  junctional beats and PVCs.   Chest x-ray:  Stable cardiomegaly.  No active disease.   HOSPITAL COURSE:  1. Acute coronary syndrome.  The patient is a man with known coronary artery     disease, cardiac enzymes were obtained x 3.  Troponin peaked at 0.09 and     showed trending downward after that.  EKG showed no significant changes.     The patient was placed on aspirin, nitroglycerin drip, heparin, and     morphine p.r.n.  Beta-blocker was held secondary to bradycardia.  The     patient continued to have episodes of chest pain while in house.  It     appeared that these episodes of chest pain were related to change in     position.  On October 03, 2002, the patient underwent cardiac     catheterization, results as noted above.  At that time, it was determined     that the patient's chest pain is due to myocardial infarction ischemia     maybe secondary to second diagonal versus collateral insufficiency of the     OM.  The patient was continued on medical therapy with no further     intervention.  The patient was discharged following his catheterization     with discontinuation of his heparin and nitroglycerin drip.   1. Hypertension.  This was well controlled in house on home doses of     Norvasc, Lisinopril, and HCTZ.   1. Hyperlipidemia.  The patient was admitted on Zocor.  Fasting lipid    profile was obtained.  Total  cholesterol 140,  triglycerides 80, HDL 52,     LDL 72.  The patient was continued on his current regimen of Zocor.   1. Hypokalemia, likely secondary to HCTZ use, was replenished while in     house.   1. Gout.  The patient continued on his home dose of allopurinol with no     acute flares while in house.   PERTINENT LABS:  CK 142, 117, 142; CK-MB 4.9, 3.9, 4.9; troponin 0.09, 0.03,  0.02.                                                Delbert Harness, MD    JK/MEDQ  D:  03/26/2003  T:  03/28/2003  Job:  [id number]

## 2010-05-30 NOTE — Procedures (Signed)
NAME:  Javier, Mills NO.:  [id number]   MEDICAL RECORD NO.:  [id number]          PATIENT TYPE:  OIB   LOCATION:  NA                           FACILITY:  [facility]   PHYSICIAN:  Pricilla Riffle, M.D.    DATE OF BIRTH:  08-06-1932   DATE OF PROCEDURE:  DATE OF DISCHARGE:                                    STRESS TEST   [name] is a 77 year old gentleman with coronary artery disease status  post coronary artery bypass grafting in 1983.  A re-look cath in 2004  revealed a totally occluded saphenous vein graft to the second marginal  branch.  He had a normal ejection fraction at that time.  He now presents  with new-onset AFib and as pre-op for a back surgery.   BASELINE DATA:  Electrocardiogram reveals atrial fibrillation at a rate of  61 beats per minute with Q-waves in inferior leads, poor R-wave progression  and nonspecific ST abnormalities.  Blood pressure is 138/78.   The patient exercised for a total of 10 minutes and 18 seconds through Bruce  protocol stage IV and to 12.8 METS.  Maximum heart rate was, the computer  was reading, 189 beats per minutes, but I feel this was probably 150 beats  per minute, which is approximately 100% of predicted maximum.  Maximum blood  pressure was 160/86 and resolved down to 128/70 in recovery.  Electrocardiogram revealed no ischemic changes.  He did have multiple PVCs  during exercise and recovery.  He had some transient left bundle branch  block and transient ventricular tachycardia of approximately four beats.  He  also had some ventricular bigeminy and a few ventricular couplets.   The patient denied any symptoms during exercise.  His stress was stopped  secondary to fatigue.   Final images and results are pending MD review.      Amy B   AB/MEDQ  D:  07/10/2004  T:  07/10/2004  Job:  [id number]

## 2010-05-30 NOTE — Assessment & Plan Note (Signed)
[name]                           ELECTROPHYSIOLOGY OFFICE NOTE   NAME:[name]                      [name]:          [id number]  DATE:09/01/2005                            DOB:          Jun 19, 1932    [name] returns today for follow-up.  He is a very pleasant, elderly man  with a history of symptomatic bradycardia and status post pacemaker  insertion.  He has a history of hypertension.  He has a history of severe  spinal problems and status post surgery in the past.   PHYSICAL EXAMINATION:  GENERAL APPEARANCE:  He is a pleasant, well-  appearing, elderly man in no distress.  VITAL SIGNS:  Blood pressure was 150/85, pulse 60 and regular, respirations  18.  NECK:  No jugular venous distension.  LUNGS:  Clear to auscultation bilaterally.  CARDIOVASCULAR:  Regular rate and rhythm with normal S1 and S2.  EXTREMITIES:  No edema.   Interrogation of his defibrillator demonstrates a St. Jude Ultra with R-  waves of 9 mV and a pacing impedance of 600 ohms and a threshold 0.5 volts  at 0.4 msec.  The battery was beginning at 5.  His histogram demonstrated  some nonsustained VT.  He had no symptoms with this.   IMPRESSION:  1. Symptomatic bradycardia.  2. Chronic atrial fibrillation.  3. Chronic Coumadin therapy.   DISCUSSION:  Overall, [name] is stable.  His pacemaker is working  normally.  Will plan to see him back in the office in one year.                                   Doylene Canning. [name], MD   GWT/MedQ  DD:  09/01/2005  DT:  09/02/2005  Job #:  [id number]   cc:   Patrica Duel, MD

## 2010-05-30 NOTE — H&P (Signed)
[place]  Patient:    [name], [name] Visit Number: [id number] [name]: [id number]          Service Type: Attending:  Payton Doughty, M.D. Dictated by:   Payton Doughty, M.D. Adm. Date:  11/30/00                           History and Physical  ADMITTING DIAGNOSIS: Spinal stenosis at L4-5.  HISTORY OF PRESENT ILLNESS: This is a very nice 77 year old right-handed white gentleman, who has had three lumbar disk operations at L5-S1 by Dr. Roxan Hockey in 1970s and 1980s.  He had been doing well.  Normal amount of back pain.  He has become an avid runner and participating in senior games.  Did some running and jumping and had pain in his back and down his left leg with control of left foot.  I started seeing him in August 2002 and tried him on epidural steroids, which did not do any good, and an MRI demonstrated severe spinal stenosis at L4-5 with a little bit of disk hypertrophy of the ligamentum flavum and facet arthropathy.  He did not get any better with the epidural steroids and is now here for decompression at L4-5.  PAST MEDICAL HISTORY: Remarkable for hypertension.  MEDICATIONS:  1. Lotensin 20 mg q.d.  2. Amlodipine 5 mg q.d.  3. Hydrochlorothiazide 25 mg q.d.  4. He has gout and is on allopurinol 300 mg q.d. for that.  5. Aspirin q.d.  6. Simvastatin 40 mg q.d.  ALLERGIES: None.  PAST SURGICAL HISTORY:  1. Three back operations as noted before.  2. Heart operation in 1986.  3. Cholecystectomy.  4. Appendectomy.  5. Two knee operations on the left.  SOCIAL HISTORY: He does not smoke or drink and is retired.  He is an avid runner and athlete.  He has dropped from 209 pounds to 175 pounds.  FAMILY HISTORY: His Mom is 27 and in good health.  Dad is deceased, history is not given.  REVIEW OF SYSTEMS: Remarkable for hypertension, hypercholesterolemia, glasses, hearing loss, skin cancers which are basal cell.  PHYSICAL EXAMINATION:  HEENT:  Examination within normal limits.  NECK: He has reasonable range of motion of his neck.  CHEST: Clear.  CARDIAC: Regular rate and rhythm.  No murmur.  ABDOMEN: Nontender.  No hepatosplenomegaly.  EXTREMITIES: Without clubbing or cyanosis.  Peripheral pulses good.  He has pain in his right ankle secondary to gout at this time.  GU: Examination deferred.  NEUROLOGIC: He is awake and alert and oriented.  Cranial nerves intact.  Motor examination shows 5/5 strength throughout the upper and lower extremities save for dorsiflexion on the left, which are 5-/5.  Sensory, disk disease as described in the left L5-S1 distribution.  Straight-leg-raise is positive. Reflexes are 2 at the knees, flicker at the left ankle, 1 at the right ankle.   LABORATORY DATA: His MR, as noted above, shows spinal stenosis at L4-5 with disk and hypertrophy of the ligamentum flavum.  CLINICAL IMPRESSION: Lumbar spondylosis with left L5 radiculopathy.  PLAN: The plan is for decompression at L4-5.  The risks and benefits of this approach have been discussed with him and he wishes to proceed. Dictated by:   Payton Doughty, M.D. Attending:  Payton Doughty, M.D. DD:  11/30/00 TD:  11/30/00 Job: [id number]

## 2010-05-30 NOTE — Discharge Summary (Signed)
[name], [name] NO.:  [id number]   MEDICAL RECORD NO.:  [id number]          PATIENT TYPE:  INP   LOCATION:  3032                         FACILITY:  [facility]   PHYSICIAN:  Payton Doughty, M.D.      DATE OF BIRTH:  08/31/1932   DATE OF ADMISSION:  12/22/2005  DATE OF DISCHARGE:  12/26/2005                               DISCHARGE SUMMARY   ADMITTING DIAGNOSIS:  Spondylosis C1-C2, C4-5C.   DISCHARGE DIAGNOSIS:  Spondylosis C1-C2, C4-C5.   PROCEDURES:  Occiput to C5 posterior cervical fusion __________  surgery.   HISTORY:  77 year old right-handed white gentleman.  His history and  physical is recounted in the chart.  He has had increasing pain in his  neck and arms.  MRI shows __________ at C1-2.  Is admitted for posterior  fusion.  General health is good.  He has had pacemaker in.  Neurologic  exam is intact.  He was admitted after __________  C5 fusion.  Postoperatively he has done well.  He was in the ICU for a couple of  days.  Got sick on morphine.  Dilaudid caused him to have headaches.  Stopping the Dilaudid helped his headache to go away.  Currently, he is  awake, alert, and oriented.  His strength is full.  His incision is dry  and well-healing.  He will be ready for discharge in the morning.  His  followup will be at the [facility] office in  about a week for sutures.           ______________________________  Payton Doughty, M.D.     MWR/MEDQ  D:  12/25/2005  T:  12/27/2005  Job:  [id number]

## 2010-05-30 NOTE — H&P (Signed)
NAME:  [name], [name] NO.:  [id number]   MEDICAL RECORD NO.:  [id number]                   PATIENT TYPE:  INP   LOCATION:  2903                                 FACILITY:  [facility]   PHYSICIAN:  Newcastle Bing, M.D.               DATE OF BIRTH:  07-03-1932   DATE OF ADMISSION:  10/01/2002  DATE OF DISCHARGE:                                HISTORY & PHYSICAL   REFERRING:  Dr. Karleen Hampshire.   PRIMARY CARDIOLOGIST:  Dr. Dietrich Pates.   HPI:  This is a 77 year old gentleman with remote CABG surgery who presents  with chest pain.  Mr. Wolz underwent CABG surgery at [facility]  approximately 20 years ago - the details of that procedure are not  available.  He has done exceedingly well since with excellent exercise  tolerance and little in the way of symptoms.  He did undergo coronary  angiography approximately six years ago at [facility]  Those  records are not currently available, but the patient was told that his  bypass grafts were widely patent.  He has been quite active lately,  including running a 5K race a few weeks ago.  On the evening prior to  admission he noted the onset of anterior chest discomfort that lasted for  several hours.  The quality was tightness or pressure.  The intensity was  mild to moderate.  There were no associated symptoms.  There was no  relationship to exertion.  He recalls this as similar to previous cardiac  symptoms.  He subsequently felt well until later in the evening when he  developed a sharp, upper left-sided chest discomfort that was more intense.  This was persistent, with intermittent exacerbations that were fairly  severe.  Again, there were no associated symptoms.  There was no radiation.  He has noticed some blurred vision over the past 24 hours that he thinks may  be related.  He was seen in the [facility] Emergency Department and given  nitroglycerin without relief.  Heparin was added to his  regime.  He  continued to have some discomfort until morphine was given.  Blood pressure  was moderately elevated on presentation, the initial value being 210/85.   PAST MEDICAL HISTORY:  Notable for:  1. Hypertension.  2. Gout.  3. GERD.   He has no known allergies.   Recent medications have included:  1. Amlodipine 5 mg daily.  2. Lisinopril 20 mg daily.  3. Simvastatin 40 mg daily.  4. Allopurinol 300 mg daily.  5. Hydrochlorothiazide 25 mg daily.  6. Aspirin 325 mg daily.   The patient's principal medical problem has been DJD of the lower spine.  He  has had multiple surgeries including a variety of hardware devices placed.  He has also previously undergo cholecystectomy, appendectomy, and bilateral  laparoscopic knee surgery.   SOCIAL HISTORY:  Lives alone; retired from work  with the [employer]; a 60-pack-year history of cigarette smoking, none recently.  Has  never used alcohol to excess.   REVIEW OF SYSTEMS:  Mr. Lento describes urinary frequency that he attributes  to his diuretic.  Otherwise, all other systems are negative.   EXAM:  GENERAL:  Trim, very pleasant gentleman with an earring in his left  earlobe.  In no acute distress.  VITAL SIGNS:  Temperature is 97.3, blood pressure 140/70, heart rate 50 and  regular, respirations 16, O2 saturation 100% on 2 L.  HEENT:  Anicteric sclerae.  NECK:  No jugular venous distention; no carotid bruits.  ENDOCRINE:  No thyromegaly.  HEMATOPOIETIC:  No adenopathy.  SKIN:  No significant lesions.  LUNGS:  Clear.  CARDIAC:  Normal first and second heart sounds; fourth heart sound present.  ABDOMEN:  Soft and nontender; no organomegaly.  EXTREMITIES:  Distal pulses bounding; no edema.  NEUROMUSCULAR:  Symmetric strength and tone.   INITIAL LABORATORY:  Notable for normal CPK and MB with a troponin of 0.09.  Potassium is 3.4.  The d-dimer is 0.6.   Chest x-ray:  Cardiomegaly; clear lung fields.   EKG:  Sinus  bradycardia; PVCs and junctional escape beats; prior inferior  myocardial infarction.   IMPRESSION:  Mr. Brubacher presents with somewhat worrisome chest discomfort and  a borderline initial troponin.  We will locate prior records to obtain  details regarding his coronary anatomy and to compare his current EKG to  older tracings.  Heparin and intravenous nitroglycerin will be continued  with morphine if needed.  If serial markers are positive or if chest  discomfort is recurrent or persistent, coronary angiography will probably be  necessary.  If symptoms resolve and initial sets of markers are negative,  stress testing will be considered.   Hypertension is adequately controlled.  We will attempt to reduce the dose  of diuretic to see if that helps with Mr. Tenbrink urinary symptoms.   He most recently had back surgery a year ago.  He probably had a negative  stress Cardiolite prior to that surgical procedure. We will obtain office  records to verify that information.      ___________________________________________                                            Walker Bing, M.D.   RR/MEDQ  D:  10/01/2002  T:  10/02/2002  Job:  [id number]

## 2010-05-30 NOTE — Consult Note (Signed)
NAMEBONNY, [name] NO.:  [id number]   MEDICAL RECORD NO.:  [id number]          PATIENT TYPE:  INP   LOCATION:  2899                         FACILITY:  [facility]   PHYSICIAN:  Duke Salvia, M.D.  DATE OF BIRTH:  Mar 10, 1932   DATE OF CONSULTATION:  08/08/2004  DATE OF DISCHARGE:                                   CONSULTATION   ALLERGIES:  No known drug allergies.   Mr. Colglazier is a 77 year old male who has a history of previous myocardial  infarction and subsequent coronary artery bypass graft surgery in 1983.  His  last catheterization was in 2004.  Ejection fraction was 60% at that time  and left main was free of disease.  The saphenous vein graft to the LAD was  patent, the saphenous vein graft to the PDA was patent, the saphenous vein  graft to the second obtuse marginal, however, was occluded but fed by robust  collaterals from the LAD.  He has a history of spinal stenosis and  radiculopathy.  He had three back surgeries in the 1970s and 1980s and in  November 2004, had an L4-L5 laminectomy and another L4-L5 laminectomy in  July 2003.  He was evaluated expectantly for repeat back surgery by  cardiologist, Dr. Channing Mutters is anticipating spinal fusion of the L4-L5 and L5-S1.  He has a history of sick sinus syndrome and on evaluation for upcoming back  surgery, was found to be in atrial fibrillation.  This is of new onset.  He  has no chest discomfort or shortness of breath but does relate that he is  profoundly fatigued and this is in the last 4-5 months.  As part of the  preoperative workup, he had an exercise Cardiolite study which showed  inferior posterior scar without ischemia.  An echocardiogram was also  performed expectantly for surgery on July 11, 2004, and showed ejection  fraction of 40-45% with inferoposterior basal akinesis.  At the time of the  preoperative evaluation, the patient presented with asymptomatic bradycardia  and also pacemaker therapy was  considered, was not warranted at that time.   The patient proceeded to elective surgery on July 28.  During induction, the  patient exhibited profound bradycardia with concomitant drop in blood  pressure.  This bradycardia was initially unresponsive to epinephrine,  atropine, Dopamine.  Heart rate was in the 20s, systolic blood pressure,  however, maintained in the 90s.  The surgery was cancelled and eventually  the patient's heart rate returned to more normal levels.  There was a strip  showing heart rate in the 90s with atrial fibrillation.  Electrocardiogram  taken on July 26 with a heart rate of 60 beats per minute shows atrial  fibrillation with slow ventricular response.  During induction, there are  strips which look like atrial fibrillation, a coarse fibrillation with very  slow ventricular response and, once again, emerging from anesthesia, the  strip shows atrial fibrillation with a rate of 90.  The patient has not, to  this date, been on anticoagulation.  The patient was assessed by Dr. Sherryl Manges  and in consultation with Dr. [place] Bing, it was determined that  because of significant symptoms of fatigue and the possibility that even if  the patient does not currently need pacing for his sick sinus syndrome, he  would eventually possibly need it for sick sinus syndrome alone.  These  factors help formulate the decision for the patient to undergo pacemaker  therapy.  He will present electively to short stay at [facility] on  Tuesday, August 1.  The patient's cardiac risk factors include hypertension  and dyslipidemia.  He has risk equivalents of prior history of myocardial  infarction and coronary artery disease.   MEDICATIONS:  Enteric coated aspirin 325 mg daily, Allopurinol 300 mg daily,  Zocor 40 mg daily, Sular 10 mg daily, hydrochlorothiazide 25 mg daily,  Lisinopril 20 mg daily.   SOCIAL HISTORY:  The patient lives in [place].  He does not smoke, he quit  just  prior to undergoing coronary artery bypass graft surgery.  He drinks  socially alcoholic beverages.  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  He had been actively engaged in jogging after undergoing bypass  surgery and, of course, this activity has been curtailed secondary to  ongoing back pain for the last two years.   FAMILY HISTORY:  Her mother is 38 and alive and well.  Father is deceased,  he died in WWII.  He has one half sister.   REVIEW OF SYMPTOMS:  The patient is not having fevers, chills, night sweats,  weight change, or adenopathy.  HEENT:  No nasal discharge, epistaxis, voice  changes, or hoarseness.  He does have vertigo and is treated for this.  No  photophobia.  He is hard of hearing.  INTEGUMENT:  No rashes or nonhealing  ulcerations.  CARDIOPULMONARY:  No chest pain since his bypass surgery.  No  shortness of breath or dyspnea on exertion.  He does have two pillow  orthopnea.  No paroxysmal nocturnal dyspnea.  No edema.  His presyncopal  symptoms are related to inner ear.  No frank syncope, no palpitations, no  claudication of the lower extremities.  UROGENITAL:  Nocturia 2-3 times.  NEUROPSYCHIATRIC:  No weakness, numbness, depression, or anxiety.  GASTROINTESTINAL:  No history of GI bleeding, no chronic heartburn.  ENDOCRINE:  No history of thyroid disease or diabetes.  MUSCULOSKELETAL:  As  described, the patient has spinal stenosis and has undergone at least five  back surgeries prior to these current planned spinal fusion.  All other  systems negative.   PHYSICAL EXAMINATION:  VITAL SIGNS:  Temperature 97.2, pulse 46, blood pressure 118/79,  respirations 15, oxygen saturation 100% on 1.5 liters.  GENERAL:  Alert and oriented x 3, no acute distress.  HEENT:  Normocephalic, atraumatic.  Pupils equal, round, reactive to light.  Extraocular motions are intact.  Sclerae anicteric.  Nares without discharge.  NECK:  Supple, no carotid bruits  auscultated, no thyromegaly, no jugular  venous distention, no cervical lymphadenopathy.  HEART:  Rate is slow, mildly irregular, without bruit.  LUNGS:  Clear to auscultation bilaterally.  ABDOMEN:  Soft, nondistended, bowel sounds present, the abdominal aorta is  nonpulsatile.  EXTREMITIES:  There is no evidence of cyanosis, clubbing, and edema.  Dorsalis pedes pulses, however, did diminish bilaterally.  MUSCULOSKELETAL:  No joint deformity, effusions, or kyphosis.  NEUROLOGICAL:  No focal neurologic deficits.   LABORATORY DATA:  Electrocardiogram on July 26 showing atrial fibrillation  with slow ventricular response, QRS  is 120, there are multiple isolated  PVCs.  Electrocardiogram July 28 at 8:08 a.m. shows profound bradycardia  which is atrial fibrillation with very slow ventricular response.  PT 13.5, INR 1, PTT 33.  Serum electrolytes are sodium 140, potassium 3.5,  chloride 103, carbonate 27, BUN 22, creatinine 1.1, glucose 107, alkaline  phosphatase 52, SGOT 36, SGPT 23.  CBC showing white count 8.9, hemoglobin  14.5, hematocrit 43, platelets 214.   ASSESSMENT:  1.  Admitted August 08, 2004, for spinal fusion of the L4-L5 and L5-S1.  2.  Profound bradycardia during induction with concomitant hypertension, the      rhythm is atrial fibrillation with very slow ventricular response.  3.  History of myocardial infarction, severe three vessel coronary artery      disease, status post coronary artery bypass graft surgery in 1983.  4.  Left heart catheterization in 2004 as dictated above, he has three      grafts placed, SVG to the LAD is patent, SVG to the PDA is patent, SVG      to OM2 is occluded.  5.  History of back surgeries, three in the 1970s, 1980s, and November 2002,      laminectomy of the L4-L5, and in July 2003, laminectomy of the L5-S1, L4-      L5, with facetectomy.  6.  Gout.  7.  Hypertension.  8.  Fatigue.   PLAN:  Pacemaker placement, will plan for August 1 by  Dr. [name].      Debbora Lacrosse   GM/MEDQ  D:  08/08/2004  T:  08/08/2004  Job:  [id number]   cc:   [place] Bing, M.D.   Payton Doughty, M.D.  [address]  [place]  [place] [address]  Fax: [phone]

## 2010-05-30 NOTE — Group Therapy Note (Signed)
NAME:  [name], [name] NO.:  [id number]   MEDICAL RECORD NO.:  [id number]          PATIENT TYPE:  INP   LOCATION:  A306                          FACILITY:  [facility]   PHYSICIAN:  Edward L. Juanetta Gosling, M.D.DATE OF BIRTH:  12/05/1932   DATE OF PROCEDURE:  DATE OF DISCHARGE:  10/21/2005                                   PROGRESS NOTE   Mr. Wishon says he is better.  He feels better.  He has no new complaints.  His neck is improved.   PHYSICAL EXAMINATION:  Today shows his temperature is 99.3, pulse 61,  respirations 18, blood pressure 134/72, O2 saturation 95% on room air.   ASSESSMENT:  He has torticollis related to multiple lesions in his neck.  He  has pneumonia which, of course, will make him not a candidate for surgery  right now.   PLAN:  My plan then will be to continue his meds, continue his treatments  and follow.  He will need chest x-rays until the area is clear.  That may  take several weeks.      Edward L. Juanetta Gosling, M.D.  Electronically Signed     ELH/MEDQ  D:  10/20/2005  T:  10/21/2005  Job:  [id number]

## 2010-05-30 NOTE — Procedures (Signed)
NAME:  QURON, RUDDY NO.:  [id number]   MEDICAL RECORD NO.:  [id number]          PATIENT TYPE:  OUT   LOCATION:  RAD                           FACILITY:  [facility]   PHYSICIAN:  Olga Millers, M.D. LHCDATE OF BIRTH:  July 21, 1932   DATE OF PROCEDURE:  07/11/2004  DATE OF DISCHARGE:                                  ECHOCARDIOGRAM   INDICATIONS FOR PROCEDURE:  The patient is a 77 year old male with past  medical history of coronary artery disease, status post coronary artery  bypass graft as well as hypertension.  This study is performed to quantify  left ventricular function.  It is technically adequate.   The two-dimensional measurements are as follows:  The left ventricular end-  diastolic dimension is 4.6 cm and the left ventricular end-systolic  dimension is 3.6 cm.  The septum is 1.4 cm and the posterior wall is 1.1 cm.  The aorta is 3 cm.   There is inferoposterior basal akinesis with overall moderate reduction in  LV function.  The estimated ejection fraction is in the 40-45% range.  There  is mild concentric left ventricular hypertrophy.  There is mild biatrial  enlargement.  The right ventricle also appears to be mildly enlarged, but  the function is normal.  The aortic valve is mildly sclerotic, but there is  no aortic stenosis or aortic insufficiency.  There is mild mitral annular  calcification with mild mitral regurgitation.  There is mild tricuspid  regurgitation.   FINAL INTERPRETATION:  1.  Inferoposterior basal akinesis with overall moderate reduction in left      ventricular function.  2.  Mild concentric left ventricular hypertrophy.  3.  Mild biatrial enlargement.  4.  Mild right ventricular enlargement.  5.  Mild mitral and tricuspid regurgitation.       BC/MEDQ  D:  07/14/2004  T:  07/14/2004  Job:  [id number]   cc:   Patrica Duel, M.D.  [address], [address]  [place]  [place] [address]  Fax: [phone]

## 2010-05-30 NOTE — Discharge Summary (Signed)
[name], [name] NO.:  [id number]   MEDICAL RECORD NO.:  [id number]          PATIENT TYPE:  INP   LOCATION:  3023                         FACILITY:  [facility]   PHYSICIAN:  Payton Doughty, M.D.      DATE OF BIRTH:  10-26-1932   DATE OF ADMISSION:  09/02/2004  DATE OF DISCHARGE:  09/08/2004                                 DISCHARGE SUMMARY   ADMISSION DIAGNOSES:  Spondylosis of L3-4 and L2-3.   DISCHARGE DIAGNOSES:  Spondylosis of L3-4 and L2-3.   PROCEDURE:  L2-3 and L3-4 laminectomy, diskectomy, posterior lumbar  interbody fusion with transfer effusion cages, segmental pedicle screw  fixation from L2 through L4, and posterolateral arthrodesis L2 to L4.   ANESTHESIA:  General endotracheal.   COMPLICATIONS:  None.   DISCHARGE STATUS:  Alive and well.   BODY OF TEXT:  A 77 year old gentleman whose history and physical is  recounted on the chart. He had a prior fusion at L4-5 and L5-S1. He  developed spondylosis at L3-4 and L2-3. He was scheduled for an operation a  couple of weeks ago. He had symptomatic bradycardia and had a pacemaker  implanted, and now presents for effusion. Medical history is remarkable for  coronary artery disease. General exam was intact. Neurologic exam was intact  with neurogenic claudication and back pain. He was admitted after  ascertainment of normal laboratory values and underwent an effusion at L2-3  and L3-4. It was then hooked into his L4-5 and L5-S1 fusion. He spent a  couple of days in the ICU postoperatively for cardiac monitoring, but  nothing really ever became of it. He was then transferred to the ICU and  then out to the floor. He has remained afebrile, eating and voiding  normally. Exam of his strength reveals his full, incision is dry and well  healing. He is now ready for discharge home. His follow-up will be in the  [facility] associate's office in about five days for suture  removal. He is being discharged  on Cipro and Percocet.           ______________________________  Payton Doughty, M.D.     MWR/MEDQ  D:  09/08/2004  T:  09/08/2004  Job:  [id number]

## 2010-05-30 NOTE — Cardiovascular Report (Signed)
NAME:  [name], [name] NO.:  [id number]   MEDICAL RECORD NO.:  [id number]                   PATIENT TYPE:  INP   LOCATION:  2903                                 FACILITY:  [facility]   PHYSICIAN:  Salvadore Farber, M.D.             DATE OF BIRTH:  [birth year]   DATE OF PROCEDURE:  10/03/2002  DATE OF DISCHARGE:                              CARDIAC CATHETERIZATION   PROCEDURE:  Coronary and bypass graft angiography, left heart  catheterization, left ventriculography, selective bilateral renal  angiography.   INDICATION:  [name] is a 77 year old gentleman status post coronary  artery bypass grafting at [facility] in 1984.  He now presents with  chest discomfort at rest which is some different than his prior angina.  Troponin was slightly elevated at 0.09.  Due to the age of his bypass grafts  and the elevated troponin, he was referred directly for angiography.   PROCEDURAL TECHNIQUE:  Informed consent was obtained.  Under 1% lidocaine  local anesthesia, a 6 French sheath was placed in the right femoral artery  using the modified Seldinger technique.  Diagnostic angiography and  ventriculography were performed using JL-4, JR-4, and pigtail catheters.  The JR-4 catheter was used for each vein graft.  The pigtail catheter was  then pulled back to the suprarenal abdominal aorta.  Abdominal aortography  without runoff was performed by power injection.  There was a stenosis of  unclear severity at the ostium of the right renal artery.  Therefore,  selective bilateral renal angiography was performed using a JR-4 catheter.  Following the procedure, the patient was taken to the holding room in stable  condition.  Sheaths are to be removed there.   COMPLICATIONS:  None.   FINDINGS:  1. LV:  153/6/19. EF 60% with mid inferior akinesis.  2. No aortic stenosis or mitral regurgitation.  3. Left main:  Angiographically normal.  4. LAD:  The LAD is a large  vessel giving rise to two large diagonal     branches.  There is a 50% stenosis of the proximal vessel extending into     the first obtuse marginal.  The LAD is occluded after the takeoff of the     first marginal.  The saphenous vein graft to the mid LAD is widely     patent.  This vessel fills a second diagonal in a retrograde fashion.     There is a 90% stenosis of the LAD just after the second diagonal which     limits filling of that diagonal.  5. Circumflex:  Large vessel giving rise to three obtuse marginals.  There     is a 40% stenosis of the AV groove circumflex after the takeoff of the     small first marginal.  The second marginal is occluded at its ostium. The     vein graft at this marginal is occluded at its  ostium. This second     marginal is robustly collateralized from the RCA.  6. RCA:  RCA is occluded proximally.  The saphenous vein graft at the PDA is     widely patent with no significant stenosis.  There is a 40% stenosis at     the PDA just distal to the touch down at the graft.  7. Single renal arteries bilaterally.  The artery on the left is normal.     There is a 50% stenosis of the right renal artery.  There was no     associated gradient when measured using a 6 French catheter.   IMPRESSION/RECOMMENDATIONS:  1. Patent saphenous vein grafts to left anterior descending and right     coronary artery.  2. Occluded saphenous vein graft to obtuse marginal.  The obtuse marginal is     well collateralized from the right coronary artery.  3. Normal left ventricular size and systolic function.  4. Mild renal artery stenosis on the right.   The patient's chest pain, if due to myocardial ischemia, may be due to the  second diagonal versus collateral insufficiency of the OM.  Either way, we  plan continuation of medical therapy.                                                 Salvadore Farber, M.D.    WED/MEDQ  D:  10/03/2002  T:  10/03/2002  Job:  [id number]   cc:    Patrica Duel, M.D.  [address], [address]  [place]  [place] [address]  Fax: [phone]

## 2010-05-30 NOTE — Op Note (Signed)
NAME:  [name], OKANE NO.:  [id number]   MEDICAL RECORD NO.:  [id number]          PATIENT TYPE:  INP   LOCATION:  2550                         FACILITY:  [facility]   PHYSICIAN:  Payton Doughty, M.D.      DATE OF BIRTH:  April 30, 1932   DATE OF PROCEDURE:  09/02/2004  DATE OF DISCHARGE:                                 OPERATIVE REPORT   PREOPERATIVE DIAGNOSIS:  Spondylosis L2-3 and L3-4.   POSTOPERATIVE DIAGNOSIS:  Spondylosis L2-3 and L3-4.   OPERATION/PROCEDURE:  1.  L2-3, L3-4 laminectomy, diskectomy, posterior interbody fusion with Ray-      threaded fusion cages.  2.  Segmental pedicle screw instrumentation from L2 to L4 and posteriorly      arthrodesis from L2 to L4.   SURGEON:  Payton Doughty, M.D.   ANESTHESIA:  General endotracheal anesthesia.   PREPARATION:  ___________ with alcohol wipe.   COMPLICATIONS:  None.   ASSISTANT:  Nurse assistant Basilia Jumbo.  Doctor assistant is Clydene Fake, M.D.   INDICATIONS:  The patient is a 77 year old gentleman with severe lumbar  spondylosis at L2-3 and L3-4.  He has already been fused at L4-5 and L5-S1.   DESCRIPTION OF PROCEDURE:  He was taken to the operating room, smoothly  induced and intubated and placed prone on the operating table.  Following  sterile prep and drape in the usual sterile fashion, skin was infiltrated  with 1% lidocaine with 1:400,000 epinephrine.   The skin was incised from the mid L1 to mid S1 and the lamina and transverse  processes of L2, L3 and the hardware at L4, L5 and S1 were exposed  bilaterally along with the transverse process of L4.  The pars intra-  articularis lamina and inferior facet of L2 and L3 and the superior facet of  L3 and L4 were removed bilaterally using the high-speed drill.  One drill  defect was close, it was closed primarily on the right side at L2-3.  Following complete decompression, Ray-threaded fusion cages, 12 x 24 mm,  were placed bilaterally at each level.   Packed bone graft harvested facet  joints.  Pedicle screws were then placed in L2 and L3.  The hardware was  taken off L4-5 and L5-S1 and after placing pedicle screws at L2-3, rods were  contoured to fit from L2 to S1.  They were tapped into place.  The caps were  tightened.  Transverse processes of L2, L3 and L4 were decorticated with a  high-speed drill and bone morphogenic probe OP1 was placed bilaterally.  Intraoperative x-rays showed good placement of Ray cages, pedicle screws and  rods.  The fascia and subcutaneous tissues were reapproximated with 0 Vicryl  in interrupted fashion.  Subcuticular tissues were approximated with 3-0 Vicryl interrupted fashion  and skin was closed with 3-0 nylon in a running locked fashion.  Betadine  and Telfa dressing was applied made occlusive with Op-Site.  The patient  returned to the recovery room in good condition.           ______________________________  Payton Doughty, M.D.  MWR/MEDQ  D:  09/02/2004  T:  09/03/2004  Job:  [id number]

## 2010-05-30 NOTE — Discharge Summary (Signed)
NAMEPRUITT, TABOADA NO.:  [id number]   MEDICAL RECORD NO.:  [id number]          PATIENT TYPE:  OIB   LOCATION:  3730                         FACILITY:  [facility]   PHYSICIAN:  Duke Salvia, M.D.  DATE OF BIRTH:  1932-03-11   DATE OF ADMISSION:  08/12/2004  DATE OF DISCHARGE:  08/13/2004                                 DISCHARGE SUMMARY   DISCHARGE DIAGNOSES:  1.  Discharging day #1 status post implantation of Guidant INSIGNIA I Ultra      DR model 1291 serial [id number].  2.  Symptomatic bradycardia with profound bradycardia during induction for      back surgery which was subsequently canceled on August 08, 2004.  3.  Atrial fibrillation with slow ventricular response.   SECONDARY DIAGNOSES:  1.  Admitted August 08, 2004 for spinal fusion of L4-L5 and L5-S1.  2.  History of myocardial infarction with severe three vessel coronary      artery disease status post coronary artery bypass graft surgery 1983.  3.  Left heart catheterization 2004.  The saphenous vein graft to the left      anterior descending is patent, saphenous vein graft to the posterior      descending artery is patent, and saphenous vein graft to the obtuse      marginal 2 is occluded.  4.  History of back surgery, three in the 1970s and 1980s with surgery in      November 2002 and laminectomy at L4-L5 and more recent surgery July 2003      laminectomy of L5-S1 and L4-L5 with facetectomy.  5.  Gout.  6.  Hypertension.   PROCEDURE:  August 12, 2004:  Implantation of Guidant DDD permanent pacemaker  by Dr. [name].   DISCHARGE DISPOSITION:  Mr. Joren Rehm is discharging day #1 status post  implantation of permanent pacemaker.  His incision looks good.  There is no  erythema or swelling.  He has minimal pain at the pacer site.  Chest x-ray  shows appropriate position of the leads.  The pacemaker has been  interrogated and all values within normal limits.  Patient has been given  pain  medication to help with analgesia.  Also, mobility of the left upper  extremity has been discussed with the patient and he has been asked to keep  the incision dry for the next seven days.  He has discussed upcoming back  surgery with Dr. [name] and is anxious to have this done in the next two to  three weeks.  Dr. [name] had proposed interim Coumadin and then starting  amiodarone with plans for DC cardioversion of his atrial fibrillation.  However, this will be placed on hold and the patient will be able to proceed  with surgery in the next two to three weeks.  Post surgery will be very  interested in starting Mr. Stafford on Coumadin, then amiodarone, then DCCV.  We anticipate that the patient will be seen back in our office on August 24  in the clinic.   DISCHARGE MEDICATIONS:  1.  Enteric-coated aspirin 325 mg  daily.  2.  Allopurinol 300 mg daily.  3.  Zocor 40 mg daily at bedtime.  4.  Sular 10 mg daily.  5.  Hydrochlorothiazide 25 mg daily.  6.  Lisinopril 20 mg daily.  7.  Potassium chloride 20 mEq daily.  8.  Darvocet-N 100 one to two tablets every three to four hours as needed      for pain.   He follows up at [facility] [address] [facility] Monday, August 25, 2004 at 9:15 in the morning.  He will  see Dr. [name] in November 2006 for pacemaker recalibration.   BRIEF HISTORY:  Mr. Carras is a 77 year old male.  He has a history of  previous myocardial infarction and subsequent coronary artery bypass graft  surgery done in 1983.  His last catheterization is in 2004.  Ejection  fraction was 60% at that time.  The left main was free of disease.  Saphenous vein graft to the LAD was patent.  Saphenous vein graft to the PDA  patent.  Saphenous vein graft to the second obtuse marginal, however, was  occluded, but fed by robust collaterals from the LAD.   He has a history of spinal stenosis and radiculopathy.  He has had three  back surgeries  in the 1970s and 1980s and in November 2004 had an L4-L5  laminectomy and another L4-L5 laminectomy in July 2003.  He was evaluated  expectantly for repeat back surgery by his cardiologist.  Dr. Channing Mutters is  anticipating spinal fusion of the L4-L5 and the L5-S1.  Mr. Schoon has a  history of sick sinus syndrome and on evaluation for upcoming back surgery  by cardiology he was found to be in atrial fibrillation.  This is of new  onset.  He has no chest discomfort or shortness of breath, but relates that  he is profoundly fatigued and this has been happening for the last four to  five months.  As part of his preoperative work-up he had an exercise  Cardiolite study which showed inferior/posterior scar without ischemia.  Echocardiogram also was performed prior to surgery on July 11, 2004 and  showed ejection fraction of 40-45% with inferior posterior basal akinesis.  At the time of preoperative evaluation the patient presented with  asymptomatic bradycardia and pacemaker therapy was considered, but not  warranted at that time.   Patient proceeded to elective surgery on July 28 where induction of the  patient exhibited profound bradycardia with concomitant drop in blood  pressure.  Bradycardia was initially unresponsive to epinephrine, atropine,  and dopamine.  Heart rate was in the 20s.  Systolic blood pressure  eventually returned to the 90s systolic.  Surgery was cancelled and the  heart rate was noted on return to higher levels to be in atrial  fibrillation.  At the time of this examination the patient's heart rate is  in the 40s with atrial fibrillation with slow ventricular response.  Patient  has been assessed by Dr. Sherryl Manges and in consultation with Dr. Dietrich Pates  it has been determined that because of his significant symptoms of fatigue  and the possibility that his sick sinus syndrome will only worsen that the patient is a candidate for pacemaker.  This will be performed the week of   July 31 at earliest convenience at [facility].   HOSPITAL COURSE:  Patient presented August 1 electively for placement of  permanent pacemaker.  This was done without complication.  The  patient  currently at discharge is pacing at 60 with underlying atrial fibrillation.  Patient has no palpitations, no syncope or presyncope.  Discharging with the  medications and follow-up as dictated.  Dr. [name] is in contact with Dr.  Channing Mutters for setting up back surgery within the next two to three weeks and then  we will follow up in clinic to start anticoagulation and amiodarone therapy.      Debbora Lacrosse   GM/MEDQ  D:  08/13/2004  T:  08/13/2004  Job:  [id number]   cc:   Doylene Canning. [name], M.D.   [place] Bing, M.D.   Payton Doughty, M.D.  [address]  [place]  [place] [address]  Fax: [phone]

## 2010-05-30 NOTE — H&P (Signed)
NAMEJSEAN, [name] NO.:  [id number]   MEDICAL RECORD NO.:  [id number]          PATIENT TYPE:  INP   LOCATION:  2899                         FACILITY:  [facility]   PHYSICIAN:  Payton Doughty, M.D.      DATE OF BIRTH:  03/05/1932   DATE OF ADMISSION:  08/08/2004  DATE OF DISCHARGE:                                HISTORY & PHYSICAL   ADMITTING DIAGNOSES:  Spondylosis L3-4.   This is a 77 year old right-handed white gentleman who has had numerous back  operations by Dr. Leanord Hawking.  He also had a decompressive laminectomy in 2002  and fusion at L4-5 and L5-S1 in 2003.  Been doing reasonably well and has  had an increase in pain in his back.  MR was obtained and shows stenosis at  L3-4 and he is now admitted for extension of his fusion up to L3-4.   PAST MEDICAL HISTORY:  1.  Hypertension.  He is on Lotensin 20 mg a day, amlodipine 5 mg a day,      hydrochlorothiazide 25 mg a day.  2.  Gout.  Takes allopurinol 300 mg a day and aspirin a day.   He underwent a cardiac evaluation because of new onset atrial fibrillation  and is in normal rhythm now.   PAST SURGICAL HISTORY:  1.  Four back operations noted before.  2.  Coronary bypass in 1986.  3.  Cholecystectomy.  4.  Appendectomy.  5.  Two knee operations.   SOCIAL HISTORY:  Does not smoke or drink.  Is retired.  He is an avid  athlete.   FAMILY HISTORY:  Mother is 78, in good health.  Dad is deceased.  History is  not given.   REVIEW OF SYSTEMS:  Remarkable for hypertension, hypercholesterolemia,  glasses, hearing loss, skin cancer of basal cells.   PHYSICAL EXAMINATION:  HEENT:  Within normal limits.  NECK:  Good range of motion.  CHEST:  Clear.  CARDIAC:  At this time is regular rate and rhythm.  ABDOMEN:  Nontender with no hepatosplenomegaly.  EXTREMITIES:  Without clubbing or cyanosis.  GENITOURINARY:  Deferred.  PERIPHERAL:  Pulses are good.  NEUROLOGIC:  He is awake, alert, and oriented.  Cranial nerves  are intact  save for diminished hearing.  Motor examination demonstrates 5/5 strength  throughout the upper and lower extremities.  There is sensory deficit  described as claudicatory in nature when he is up walking about and it is  roughly L3-S1 distribution pretty much symmetrically.  Reflexes are 1 at the  knees, absent at the ankles.  Straight leg raise is negative and his  incision in his back is well healed.   MR shows spondylosis at 2-3 and 3-4.  3-4 is much worse.   CLINICAL IMPRESSION:  Lumbar spondylosis with neurogenic claudication.  The  plan is to extend his fusion up to L3-4.  The risks and benefits of this  approach have been discussed with him.  He wished to proceed.       MWR/MEDQ  D:  08/08/2004  T:  08/08/2004  Job:  [id number]

## 2010-05-30 NOTE — Discharge Summary (Signed)
NAMEAMDREW, OBOYLE NO.:  [id number]   MEDICAL RECORD NO.:  [id number]          PATIENT TYPE:  INP   LOCATION:  A306                          FACILITY:  [facility]   PHYSICIAN:  Kirk Ruths, M.D.DATE OF BIRTH:  11/24/1932   DATE OF ADMISSION:  10/16/2005  DATE OF DISCHARGE:  10/10/2007LH                                 DISCHARGE SUMMARY   DISCHARGE DIAGNOSES:  1. Severe torticollis of the neck.  2. Right upper lobe pneumonia.  3. Hypokalemia.  4. Chronic atrial fibrillation with indwelling pacemaker on Coumadin.  5. Hypertension, controlled.  6. History of coronary artery bypass grafting.  7. Hyperlipidemia.   HOSPITAL COURSE:  This 77 year old male recently presented to the office  three days before admission with torticollis and severe spasm of the left  posterior neck.  At that time he was treated with steroids, Lorcet 10 and  Valium 10 mg.  He returned to the office three days later stating the pain  was worse.  He was admitted for further control of his pain.  MRI of the C-  spine showed multilevel disk disease, a pannus formation on the posterior  dens with some midline canal stenosis and visible mass effect on the spinal  cord.  He also had multiple foraminal stenosis at multiple levels.  Dr. Trey Sailors, his neurosurgeon who has operated on him previously, was consulted by  phone, reviewed the x-rays on telemetry and did not feel this was a surgical  problem.  Meanwhile, the patient spiked a temperature with some fear of  meningitis but the patient really had no meningismus symptoms.  Chest x-ray  showed possible infiltrate. Followup CT scan showed probable pneumonia of  the right upper lobe.  He was seen in consultation by Dr. Juanetta Gosling for this.  He was treated empirically with IV antibiotics.  The patient's neck pain  improved.  His fever defervesced and he did have a potassium of 3.1 on  admission which was replaced.  His initial white count was  11,000 with  hemoglobin of 12.2; these have improved to 4.7 and 9.9 hemoglobin by the  time of discharge.  His INRs were adequately therapeutic.  His urinalysis  was negative as well as his culture.  The patient was discharged home on his  previous medications.  In addition some Avelox for several more days.  He  will be followed in my office in one week.  The patient will require  followup chest films at some point to insure resolution.      Kirk Ruths, M.D.  Electronically Signed     WMM/MEDQ  D:  11/25/2005  T:  11/25/2005  Job:  [id number]

## 2010-06-10 ENCOUNTER — Ambulatory Visit (INDEPENDENT_AMBULATORY_CARE_PROVIDER_SITE_OTHER): Payer: Medicare Other | Admitting: *Deleted

## 2010-06-10 DIAGNOSIS — I4891 Unspecified atrial fibrillation: Secondary | ICD-10-CM

## 2010-06-25 ENCOUNTER — Ambulatory Visit (INDEPENDENT_AMBULATORY_CARE_PROVIDER_SITE_OTHER)
Admission: RE | Admit: 2010-06-25 | Discharge: 2010-06-25 | Disposition: A | Discharge status: Home / Self Care | Payer: Medicare Other | Source: Ambulatory Visit | Attending: Internal Medicine | Admitting: Internal Medicine

## 2010-06-25 ENCOUNTER — Ambulatory Visit (INDEPENDENT_AMBULATORY_CARE_PROVIDER_SITE_OTHER): Payer: Medicare Other | Admitting: Internal Medicine

## 2010-06-25 ENCOUNTER — Encounter: Payer: Self-pay | Admitting: *Deleted

## 2010-06-25 DIAGNOSIS — M79672 Pain in left foot: Secondary | ICD-10-CM

## 2010-06-25 DIAGNOSIS — M79609 Pain in unspecified limb: Secondary | ICD-10-CM

## 2010-06-25 DIAGNOSIS — M109 Gout, unspecified: Secondary | ICD-10-CM

## 2010-06-25 DIAGNOSIS — I10 Essential (primary) hypertension: Secondary | ICD-10-CM

## 2010-06-25 MED ORDER — METHYLPREDNISOLONE ACETATE 80 MG/ML IJ SUSP
120.0000 mg | Freq: Once | INTRAMUSCULAR | Status: AC
  Administered 2010-06-25: 120 mg via INTRAMUSCULAR

## 2010-06-25 MED ORDER — ALLOPURINOL 100 MG PO TABS: 100.0000 mg | ORAL_TABLET | Freq: Every day | ORAL | Status: DC

## 2010-06-25 MED ORDER — COLCHICINE 0.6 MG PO TABS: 0.6000 mg | ORAL_TABLET | Freq: Two times a day (BID) | ORAL | Status: DC

## 2010-06-25 NOTE — Progress Notes (Signed)
  Subjective:    Patient ID: [name], male    DOB: [birth year], 77 y.o.   [name]: [id number]  HPI He returns c/o left foot pain for several weeks, he does not recall an injury and says that it feels like a gout flare. The pain is most prominent in the head of the 5th metatarsal and it spreads over the top of the foot. The area is red and swollen and it hurts to touch or bear weight. Usual meds have not helped much.    Review of Systems  Constitutional: Negative.   HENT: Negative.   Respiratory: Negative.   Cardiovascular: Negative.   Genitourinary: Negative.   Musculoskeletal: Positive for joint swelling and arthralgias. Negative for myalgias, back pain and gait problem.  Skin: Negative for color change, pallor and rash.  Neurological: Negative.   Hematological: Negative.   Psychiatric/Behavioral: Negative.        Objective:   Physical Exam  Vitals reviewed. Constitutional: He is oriented to person, place, and time. He appears well-developed and well-nourished. No distress.  HENT:  Head: Normocephalic and atraumatic.  Right Ear: External ear normal.  Left Ear: External ear normal.  Nose: Nose normal.  Mouth/Throat: Oropharynx is clear and moist. No oropharyngeal exudate.  Eyes: Conjunctivae and EOM are normal. Pupils are equal, round, and reactive to light. Right eye exhibits no discharge. Left eye exhibits no discharge. No scleral icterus.  Neck: Normal range of motion. Neck supple. No JVD present. No tracheal deviation present. No thyromegaly present.  Cardiovascular: Normal rate, regular rhythm, normal heart sounds and intact distal pulses.  Exam reveals no gallop and no friction rub.   No murmur heard. Pulmonary/Chest: Effort normal and breath sounds normal. No stridor. No respiratory distress. He has no wheezes. He has no rales. He exhibits no tenderness.  Abdominal: Soft. Bowel sounds are normal. He exhibits no distension and no mass. There is no tenderness. There is no  rebound and no guarding.  Musculoskeletal: Normal range of motion. He exhibits tenderness (over the left foot there is erythema, warmth, and ttp from the 5th metatarsal over the dorsum of the midfoot and forefoot). He exhibits no edema.  Lymphadenopathy:    He has no cervical adenopathy.  Neurological: He is alert and oriented to person, place, and time. He has normal reflexes.  Skin: Skin is warm and dry. No rash noted. He is not diaphoretic. No erythema. No pallor.  Psychiatric: He has a normal mood and affect. His behavior is normal. Judgment and thought content normal.        Lab Results  Component Value Date   WBC 6.3 12/10/2009   HGB 15.3 12/10/2009   HCT 44.2 12/10/2009   PLT 218.0 12/10/2009   CHOL 200 12/10/2009   TRIG 155.0* 12/10/2009   HDL 56.10 12/10/2009   ALT 20 12/10/2009   AST 31 12/10/2009   NA 139 12/10/2009   K 3.5 12/10/2009   CL 99 12/10/2009   CREATININE 1.0 12/10/2009   BUN 26* 12/10/2009   CO2 29 12/10/2009   TSH 1.53 12/10/2009   PSA 1.07 12/10/2009   INR 2.0 06/10/2010    Assessment & Plan:

## 2010-06-25 NOTE — Patient Instructions (Signed)
Gout  Gout is an inflammatory condition (arthritis) caused by a buildup of uric acid crystals in the joints. Uric acid is a chemical that is normally present in the blood. Under some circumstances, uric acid can form into crystals in your joints. This causes joint redness, soreness, and swelling (inflammation). Repeat attacks are common. Over time, uric acid crystals can form into masses (tophi) near a joint, causing disfigurement. Gout is treatable and often preventable.  CAUSES  The disease begins with elevated levels of uric acid in the blood. Uric acid is produced by your body when it breaks down a naturally found substance called purines. This also happens when you eat certain foods such as meats and fish. Causes of an elevated uric acid level include:   Being passed down from parent to child (heredity).    Diseases that cause increased uric acid production (obesity, psoriasis, some cancers).    Excessive alcohol use.    Diet, especially diets rich in meat and seafood.    Medicines, including certain cancer-fighting drugs (chemotherapy), diuretics, and aspirin.    Chronic kidney disease. The kidneys are no longer able to remove uric acid well.    Problems with metabolism.   Conditions strongly associated with gout include:   Obesity.    High blood pressure.    High cholesterol.    Diabetes.   Not everyone with elevated uric acid levels gets gout. It is not understood why some people get gout and others do not. Surgery, joint injury, and eating too much of certain foods are some of the factors that can lead to gout.  SYMPTOMS   An attack of gout comes on quickly. It causes intense pain with redness, swelling, and warmth in a joint.    Fever can occur.    Often, only one joint is involved. Certain joints are more commonly involved:    Base of the big toe.    Knee.    Ankle.    Wrist.    Finger.    Without treatment, an attack usually goes away in a few days to weeks. Between attacks, you usually will not have symptoms, which is different from many other forms of arthritis.  DIAGNOSIS  Your caregiver will suspect gout based on your symptoms and exam. Removal of fluid from the joint (arthrocentesis) is done to check for uric acid crystals. Your caregiver will give you a medicine that numbs the area (local anesthetic) and use a needle to remove joint fluid for exam. Gout is confirmed when uric acid crystals are seen in joint fluid, using a special microscope. Sometimes, blood, urine, and X-ray tests are also used.  TREATMENT  There are 2 phases to gout treatment: treating the sudden onset (acute) attack and preventing attacks (prophylaxis).  Treatment of an Acute Attack   Medicines are used. These include anti-inflammatory medicines or steroid medicines.    An injection of steroid medicine into the affected joint is sometimes necessary.    The painful joint is rested. Movement can worsen the arthritis.    You may use warm or cold treatments on painful joints, depending which works best for you.    Discuss the use of coffee, vitamin C, or cherries with your caregiver. These may be helpful treatment options.   Treatment to Prevent Attacks  After the acute attack subsides, your caregiver may advise prophylactic medicine. These medicines either help your kidneys eliminate uric acid from your body or decrease your uric acid production. You may need to stay   on these medicines for a very long time.  The early phase of treatment with prophylactic medicine can be associated with an increase in acute gout attacks. For this reason, during the first few months of treatment, your caregiver may also advise you to take medicines usually used for acute gout treatment. Be sure you understand your caregiver's directions.   You should also discuss dietary treatment with your caregiver. Certain foods such as meats and fish can increase uric acid levels. Other foods such as dairy can decrease levels. Your caregiver can give you a list of foods to avoid.  HOME CARE INSTRUCTIONS   Do not take aspirin to relieve pain. This raises uric acid levels.    Only take over-the-counter or prescription medicines for pain, discomfort, or fever as directed by your caregiver.    Rest the joint as much as possible. When in bed, keep sheets and blankets off painful areas.    Keep the affected joint raised (elevated).    Use crutches if the painful joint is in your leg.    Drink enough water and fluids to keep your urine clear or pale yellow. This helps your body get rid of uric acid. Do not drink alcoholic beverages. They slow the passage of uric acid.    Follow your caregiver's dietary instructions. Pay careful attention to the amount of protein you eat. Your daily diet should emphasize fruits, vegetables, whole grains, and fat-free or low-fat milk products.    Maintain a healthy body weight.   SEEK MEDICAL CARE IF:   You have an oral temperature above 100.5.    You develop diarrhea, vomiting, or any side effects from medicines.    You do not feel better in 24 hours, or you are getting worse.   SEEK IMMEDIATE MEDICAL CARE IF:   Your joint becomes suddenly more tender and you have:    Chills.    An oral temperature above 100.5, not controlled by medicine.   MAKE SURE YOU:   Understand these instructions.    Will watch your condition.    Will get help right away if you are not doing well or get worse.   Document Released: 12/27/1999 Document Re-Released: 06/18/2009  [employer] Patient Information 2011 [employer].

## 2010-06-26 NOTE — Assessment & Plan Note (Signed)
He was given an injection of depo-medrol IM and will continue his usual meds

## 2010-06-26 NOTE — Assessment & Plan Note (Signed)
His BP is well controlled 

## 2010-06-27 ENCOUNTER — Other Ambulatory Visit: Payer: Self-pay | Admitting: Orthopaedic Surgery

## 2010-06-27 DIAGNOSIS — M25511 Pain in right shoulder: Secondary | ICD-10-CM

## 2010-06-30 ENCOUNTER — Telehealth: Payer: Self-pay | Admitting: *Deleted

## 2010-06-30 NOTE — Telephone Encounter (Signed)
Telephoned pt and made him aware that Dr Antoine Poche did clear him to stop his coumadin on Friday for his CT Athrogram on 07/09/10. Faxed over clearance note to [facility].

## 2010-06-30 NOTE — Telephone Encounter (Signed)
Message copied by Raul Del on Mon Jun 30, 2010  4:31 PM ------      Message from: Rollene Rotunda      Created: Mon Jun 30, 2010  4:10 PM       OK to stop Coumadin without bridging anticoagulation.      ----- Message -----         From: Raul Del, RN         Sent: 06/30/2010  12:23 PM           To: Rollene Rotunda, MD            Pt needs to have CT Athrogramm at [facility]  on 07/09/10 ordered by Dr Lessie Dings with [facility] . They want pt off for 4 days prior to procedure. You saw pt 11/11 for a one yr f/u 11/12. Is he cleared?

## 2010-07-08 ENCOUNTER — Encounter: Payer: Medicare Other | Admitting: *Deleted

## 2010-07-09 ENCOUNTER — Ambulatory Visit
Admission: RE | Admit: 2010-07-09 | Discharge: 2010-07-09 | Disposition: A | Discharge status: Home / Self Care | Payer: Medicare Other | Source: Ambulatory Visit | Attending: Orthopaedic Surgery | Admitting: Orthopaedic Surgery

## 2010-07-09 DIAGNOSIS — M25511 Pain in right shoulder: Secondary | ICD-10-CM

## 2010-07-10 ENCOUNTER — Ambulatory Visit: Admission: RE | Admit: 2010-07-10 | Payer: Medicare Other | Source: Ambulatory Visit

## 2010-07-10 ENCOUNTER — Ambulatory Visit
Admission: RE | Admit: 2010-07-10 | Discharge: 2010-07-10 | Disposition: A | Discharge status: Home / Self Care | Payer: Medicare Other | Source: Ambulatory Visit | Attending: Orthopaedic Surgery | Admitting: Orthopaedic Surgery

## 2010-07-10 ENCOUNTER — Other Ambulatory Visit: Payer: Self-pay | Admitting: Orthopaedic Surgery

## 2010-07-10 DIAGNOSIS — M25511 Pain in right shoulder: Secondary | ICD-10-CM

## 2010-07-14 ENCOUNTER — Ambulatory Visit (INDEPENDENT_AMBULATORY_CARE_PROVIDER_SITE_OTHER): Payer: Medicare Other | Admitting: Internal Medicine

## 2010-07-14 ENCOUNTER — Encounter: Payer: Self-pay | Admitting: Internal Medicine

## 2010-07-14 VITALS — BP 108/68 | HR 61 | Temp 97.8°F | Resp 14 | Wt 192.5 lb

## 2010-07-14 DIAGNOSIS — M109 Gout, unspecified: Secondary | ICD-10-CM

## 2010-07-14 DIAGNOSIS — Z0000 Encounter for general adult medical examination without abnormal findings: Secondary | ICD-10-CM

## 2010-07-14 DIAGNOSIS — R21 Rash and other nonspecific skin eruption: Secondary | ICD-10-CM

## 2010-07-14 DIAGNOSIS — I10 Essential (primary) hypertension: Secondary | ICD-10-CM

## 2010-07-14 MED ORDER — HYDROXYZINE HCL 25 MG PO TABS: 25.0000 mg | ORAL_TABLET | Freq: Three times a day (TID) | ORAL | Status: DC | PRN

## 2010-07-14 MED ORDER — METHYLPREDNISOLONE ACETATE 80 MG/ML IJ SUSP
120.0000 mg | Freq: Once | INTRAMUSCULAR | Status: AC
  Administered 2010-07-14: 120 mg via INTRAMUSCULAR

## 2010-07-14 MED ORDER — PREDNISONE 10 MG PO TABS: 10.0000 mg | ORAL_TABLET | Freq: Every day | ORAL | Status: DC

## 2010-07-14 NOTE — Assessment & Plan Note (Signed)
Marked, prob allergic, for depomedrol IM today, and predpack for home, to f/u any worsening symptoms or concerns , consider allergy or derm referral

## 2010-07-14 NOTE — Assessment & Plan Note (Addendum)
Unfortunately will need to stop the ACE due to rash, though it is not completely clear this is the cause (? Recent cherries ingestion);  F/u 2 wks with Dr Yetta Barre - ? Try losartan instead if rash improved

## 2010-07-14 NOTE — Assessment & Plan Note (Signed)
Pt reassured, no evidence of gouty arthropathy today,   to f/u any worsening symptoms or concerns

## 2010-07-14 NOTE — Patient Instructions (Addendum)
You had the steroid shot today Take all new medications as prescribed - the prednisone for sure (given hardcopy), and hydroxyzine for itching as needed (sent to your pharmacy) Please stop the lisinopril Continue all other medications as before Please return in 2 wks to Dr.Jones

## 2010-07-14 NOTE — Progress Notes (Signed)
Subjective:    Patient ID: [name], male    DOB: 05-Oct-1932, 77 y.o.   [name]: [id number]  HPI  Here with 2 day onset diffuse very pruritic rash, just cant stop scratching, no new med or change in med, has been eatin gmore cherries recently since he heartd that was a good remedy to prevent gout, and wonders if he has gout today, though no pain or swelling.   Pt denies fever, wt loss, night sweats, loss of appetite, or other constitutional symptoms  Pt denies chest pain, increased sob or doe, wheezing, orthopnea, PND, increased LE swelling, palpitations, dizziness or syncope.  Pt denies new neurological symptoms such as new headache, or facial or extremity weakness or numbness   Pt denies polydipsia, polyuria,  Past Medical History  Diagnosis Date  . ASCVD (arteriosclerotic cardiovascular disease)   . Decreased left ventricular function   . Ventricular hypokinesis     severe/basilar & mid-inferior and inferoseptal segments  . Scarring     cardio/inferior, inferolateral & inferoapical w/o ischemia  . Cerebrovascular disease 01/2004    right carotid bruit, minimal plaque on [place]   . Sick sinus syndrome 08/2004    Sinus Bradycardia & AFib  . Chronic anticoagulation   . History of tobacco abuse     20 pack years; d/c in 1983  . Other and unspecified hyperlipidemia   . Gout, unspecified   . Unspecified essential hypertension   . Vertigo   . Osteoarthritis   . Pacemaker 08/2004    dual-chamber Guidant   Past Surgical History  Procedure Date  . Transurethral resection of prostate   . Anterior fusion cervical spine 2006    LS [2003]; prior cervical spine procedure w/implantation of hardware[[  . Cholecystectomy   . Coronary artery bypass graft 1983    three times  . Appendectomy   . Knee surgery     Left, twice  . Pacemaker placement 08/2004    Guidant implant    reports that he has quit smoking. He does not have any smokeless tobacco history on file. He reports that he does not  drink alcohol or use illicit drugs. family history is not on file. No Known Allergies Current Outpatient Prescriptions on File Prior to Visit  Medication Sig Dispense Refill  . allopurinol (ZYLOPRIM) 100 MG tablet Take 1 tablet (100 mg total) by mouth daily.  90 tablet  3  . amLODipine (NORVASC) 10 MG tablet Take 10 mg by mouth daily.        . colchicine 0.6 MG tablet Take 1 tablet (0.6 mg total) by mouth 2 (two) times daily.  180 tablet  3  . hydrochlorothiazide 25 MG tablet Take 25 mg by mouth daily.        . nitroGLYCERIN (NITROSTAT) 0.4 MG SL tablet Place 0.4 mg under the tongue every 5 (five) minutes as needed. For chest pain--may repeat three times.       . potassium chloride SA (K-DUR,KLOR-CON) 20 MEQ tablet Take 20 mEq by mouth daily.        . sildenafil (VIAGRA) 50 MG tablet Take 50 mg by mouth daily as needed. Use as directed.       . silodosin (RAPAFLO) 8 MG CAPS capsule Take 8 mg by mouth daily. To help urine flow.       . simvastatin (ZOCOR) 40 MG tablet Take 20 mg by mouth daily.        Marland Kitchen warfarin (COUMADIN) 5 MG tablet Take 5 mg  by mouth as directed. 7.5 mg taken on Mon, Wed & Fri--By Coumadin clinic.      Marland Kitchen DISCONTD: lisinopril (PRINIVIL,ZESTRIL) 20 MG tablet Take 20 mg by mouth daily.        . cephALEXin (KEFLEX) 500 MG capsule Take 500 mg by mouth 2 (two) times daily.         No current facility-administered medications on file prior to visit.   Review of Systems Review of Systems  Constitutional: Negative for diaphoresis and unexpected weight change.  HENT: Negative for drooling and tinnitus.   Eyes: Negative for photophobia and visual disturbance.  Respiratory: Negative for choking and stridor.   No tongue or lip swelling    Objective:   Physical Exam BP 108/68  Pulse 61  Temp(Src) 97.8 F (36.6 C) (Oral)  Resp 14  Wt 192 lb 8 oz (87.317 kg)  SpO2 98% Physical Exam  VS noted Constitutional: Pt appears well-developed and well-nourished.  HENT: Head:  Normocephalic.  Right Ear: External ear normal.  Left Ear: External ear normal.  Eyes: Conjunctivae and EOM are normal. Pupils are equal, round, and reactive to light.  Neck: Normal range of motion. Neck supple.  Cardiovascular: Normal rate and regular rhythm.   Pulmonary/Chest: Effort normal and breath sounds normal.  Abd:  Soft, NT, non-distended, + BS Neurological: Pt is alert. No cranial nerve deficit.  Skin: Skin is warm. Has diffuse prurutic hive like rash to torso and extrem's Psychiatric: Pt behavior is normal. Thought content normal.         Assessment & Plan:

## 2010-07-15 ENCOUNTER — Other Ambulatory Visit: Payer: Self-pay

## 2010-07-15 DIAGNOSIS — R21 Rash and other nonspecific skin eruption: Secondary | ICD-10-CM

## 2010-07-15 MED ORDER — PREDNISONE 10 MG PO TABS: 10.0000 mg | ORAL_TABLET | Freq: Every day | ORAL | Status: AC

## 2010-07-15 MED ORDER — HYDROXYZINE HCL 25 MG PO TABS: 25.0000 mg | ORAL_TABLET | Freq: Three times a day (TID) | ORAL | Status: AC | PRN

## 2010-07-15 NOTE — Telephone Encounter (Signed)
Pt called to check the status of prescriptions from [facility] 07/02. Rxs were sent to mail order instead of local. Rxs re-sent, pt informed.

## 2010-07-17 ENCOUNTER — Encounter: Payer: Medicare Other | Admitting: *Deleted

## 2010-07-21 ENCOUNTER — Encounter: Payer: Medicare Other | Admitting: *Deleted

## 2010-07-29 ENCOUNTER — Other Ambulatory Visit: Payer: Self-pay | Admitting: Internal Medicine

## 2010-07-29 ENCOUNTER — Other Ambulatory Visit (INDEPENDENT_AMBULATORY_CARE_PROVIDER_SITE_OTHER): Payer: Medicare Other

## 2010-07-29 ENCOUNTER — Encounter: Payer: Self-pay | Admitting: Internal Medicine

## 2010-07-29 ENCOUNTER — Ambulatory Visit (INDEPENDENT_AMBULATORY_CARE_PROVIDER_SITE_OTHER): Payer: Medicare Other | Admitting: Internal Medicine

## 2010-07-29 DIAGNOSIS — E785 Hyperlipidemia, unspecified: Secondary | ICD-10-CM

## 2010-07-29 DIAGNOSIS — I10 Essential (primary) hypertension: Secondary | ICD-10-CM

## 2010-07-29 DIAGNOSIS — R413 Other amnesia: Secondary | ICD-10-CM

## 2010-07-29 DIAGNOSIS — L509 Urticaria, unspecified: Secondary | ICD-10-CM

## 2010-07-29 LAB — COMPREHENSIVE METABOLIC PANEL
ALT: 27 U/L (ref 0–53)
AST: 37 U/L (ref 0–37)
Albumin: 4.5 g/dL (ref 3.5–5.2)
CO2: 30 mEq/L (ref 19–32)
Calcium: 9.4 mg/dL (ref 8.4–10.5)
Chloride: 104 mEq/L (ref 96–112)
Creatinine, Ser: 1.2 mg/dL (ref 0.4–1.5)
GFR: 65.08 mL/min (ref >60.00)
Potassium: 4.1 mEq/L (ref 3.5–5.1)

## 2010-07-29 LAB — CBC WITH DIFFERENTIAL/PLATELET
Basophils Absolute: 0 10*3/uL (ref 0.0–0.1)
Eosinophils Relative: 0.7 % (ref 0.0–5.0)
HCT: 42.2 % (ref 39.0–52.0)
Hemoglobin: 14 g/dL (ref 13.0–17.0)
Lymphs Abs: 1.4 10*3/uL (ref 0.7–4.0)
MCV: 97.1 fl (ref 78.0–100.0)
Monocytes Absolute: 0.6 10*3/uL (ref 0.1–1.0)
Monocytes Relative: 6.6 % (ref 3.0–12.0)
Neutro Abs: 7.1 10*3/uL (ref 1.4–7.7)
Platelets: 186 10*3/uL (ref 150.0–400.0)
RDW: 15.5 % — ABNORMAL HIGH (ref 11.5–14.6)

## 2010-07-29 LAB — LIPID PANEL
Cholesterol: 208 mg/dL — ABNORMAL HIGH (ref 0–200)
Total CHOL/HDL Ratio: 3
VLDL: 19.2 mg/dL (ref 0.0–40.0)

## 2010-07-29 MED ORDER — DOXEPIN HCL 10 MG PO CAPS: 10.0000 mg | ORAL_CAPSULE | Freq: Every day | ORAL | Status: DC

## 2010-07-29 NOTE — Progress Notes (Signed)
Subjective:    Patient ID: [name], male    DOB: [birth year], 77 y.o.   [name]: [id number]  Rash This is a recurrent problem. The current episode started 1 to 4 weeks ago. The problem is unchanged. The affected locations include the torso, right arm and left arm. The rash is characterized by itchiness and redness. It is unknown if there was an exposure to a precipitant. Pertinent negatives include no anorexia, congestion, cough, diarrhea, eye pain, facial edema, fatigue, fever, joint pain, nail changes, rhinorrhea, shortness of breath, sore throat or vomiting. Past treatments include topical steroids, antihistamine and oral steroids. The treatment provided no relief. There is no history of asthma or eczema.      Review of Systems  Constitutional: Negative for fever, chills, diaphoresis, activity change, appetite change, fatigue and unexpected weight change.  HENT: Negative for congestion, sore throat, facial swelling, rhinorrhea, trouble swallowing, neck pain, neck stiffness and voice change.   Eyes: Negative for photophobia, pain, redness and visual disturbance.  Respiratory: Negative for apnea, cough, choking, chest tightness, shortness of breath, wheezing and stridor.   Gastrointestinal: Negative for nausea, vomiting, abdominal pain, diarrhea, constipation, blood in stool, abdominal distention, anal bleeding, rectal pain and anorexia.  Genitourinary: Negative for dysuria, urgency, frequency, hematuria, flank pain, decreased urine volume, enuresis and difficulty urinating.  Musculoskeletal: Negative for myalgias, back pain, joint pain, joint swelling, arthralgias and gait problem.  Skin: Positive for rash. Negative for nail changes, color change, pallor and wound.  Neurological: Negative for dizziness, tremors, seizures, syncope, facial asymmetry, speech difficulty, weakness, light-headedness, numbness and headaches.  Hematological: Negative for adenopathy. Does not bruise/bleed easily.    Psychiatric/Behavioral: Positive for confusion (he is having some memory loss) and decreased concentration. Negative for suicidal ideas, hallucinations, behavioral problems, sleep disturbance, self-injury, dysphoric mood and agitation. The patient is not nervous/anxious and is not hyperactive.        Objective:   Physical Exam  Vitals reviewed. Constitutional: He is oriented to person, place, and time. He appears well-developed and well-nourished. No distress.  HENT:  Head: Normocephalic and atraumatic.  Right Ear: External ear normal.  Left Ear: External ear normal.  Nose: Nose normal.  Mouth/Throat: Oropharynx is clear and moist. No oropharyngeal exudate.  Eyes: Conjunctivae and EOM are normal. Pupils are equal, round, and reactive to light. Right eye exhibits no discharge. Left eye exhibits no discharge. No scleral icterus.  Neck: Normal range of motion. Neck supple. No JVD present. No tracheal deviation present. No thyromegaly present.  Cardiovascular: Normal rate, regular rhythm, normal heart sounds and intact distal pulses.  Exam reveals no gallop and no friction rub.   No murmur heard. Pulmonary/Chest: Effort normal and breath sounds normal. No stridor. No respiratory distress. He has no wheezes. He has no rales. He exhibits no tenderness.  Abdominal: Soft. Bowel sounds are normal. He exhibits no distension and no mass. There is no tenderness. There is no rebound and no guarding.  Musculoskeletal: Normal range of motion. He exhibits no edema and no tenderness.  Lymphadenopathy:    He has no cervical adenopathy.  Neurological: He is alert and oriented to person, place, and time. He has normal reflexes. He displays normal reflexes. No cranial nerve deficit. He exhibits normal muscle tone. Coordination normal.  Skin: Skin is warm and dry. Rash noted. No abrasion, no bruising, no burn, no ecchymosis, no laceration, no lesion, no petechiae and no purpura noted. Rash is urticarial (he has  blanching urticarial wheals on his arms  and his torso). Rash is not macular, not papular, not maculopapular, not nodular, not pustular and not vesicular. He is not diaphoretic. No cyanosis or erythema. No pallor. Nails show no clubbing.  Psychiatric: He has a normal mood and affect. His behavior is normal. Judgment and thought content normal.         Lab Results  Component Value Date   WBC 6.3 12/10/2009   HGB 15.3 12/10/2009   HCT 44.2 12/10/2009   PLT 218.0 12/10/2009   CHOL 200 12/10/2009   TRIG 155.0* 12/10/2009   HDL 56.10 12/10/2009   ALT 20 12/10/2009   AST 31 12/10/2009   NA 139 12/10/2009   K 3.5 12/10/2009   CL 99 12/10/2009   CREATININE 1.0 12/10/2009   BUN 26* 12/10/2009   CO2 29 12/10/2009   TSH 1.53 12/10/2009   PSA 1.07 12/10/2009   INR 2.0 06/10/2010   Assessment & Plan:

## 2010-07-29 NOTE — Patient Instructions (Signed)
Hives Hives (urticaria) are itchy, red, swollen patches on the skin. They may change size, shape, and location quickly and repeatedly. Hives that occur deeper in the skin can cause swelling of the hands, feet, and face. Hives may be an allergic reaction to something you ate, touched, or put on your skin. Hives can also be a reaction to cold, heat, viral infections, medication, insect bites, or emotional stress. Often the cause is hard to find. Hives can come and go for several days to several weeks. Hives are not contagious. HOME CARE INSTRUCTIONS  If the cause of the hives is known, avoid exposure to that source.   To relieve itching and rash:   Apply cold compresses to the skin or take cool water baths. Do not take hot baths or showers because the warmth will make the itching worse.   The best medicine for hives is an antihistamine. An antihistamine will not cure hives, but it will reduce their severity. You can use an antihistamine available over the counter, such as Benadryl. This medicine may make you sleepy. You should not drive while using this medicine.   Take or give Benadryl as directed by your pharmacist or caregiver.   Other medications may be prescribed for itching. Use these as directed.   You should wear loose fitting clothing, including undergarments, as skin irritations may make hives worse.   Follow-up as directed by your caregiver.  SEEK MEDICAL CARE IF:  You still have considerable itching after taking the medication (prescribed or purchased over the counter).   An oral temperature above 100.5 develops.   Joint swelling or pain occurs.   You are not improving or are getting worse.  SEEK IMMEDIATE MEDICAL CARE IF:  You notice any swelling of the lips, tongue, face or neck.   You have shortness of breath, difficulty breathing or swallowing, or tightness in the throat or chest.   Belly (abdominal) pain develops.   You are feeling very sick.   Your hives  continue to spread.  These may be the first signs of a life-threatening allergic reaction. THIS IS AN EMERGENCY. Call 911 for medical help. Document Released: 02/06/2004 Document Re-Released: 03/25/2009 [facility] Patient Information 2011 [place], [place].

## 2010-07-30 DIAGNOSIS — R413 Other amnesia: Secondary | ICD-10-CM | POA: Insufficient documentation

## 2010-08-04 NOTE — Assessment & Plan Note (Signed)
I will stop the meds that I think could be causing this and I will order labs to look for secondary causes, I have asked him to start taking doxepin at bedtime and to let me know how he responds to that

## 2010-08-04 NOTE — Assessment & Plan Note (Signed)
His BP is well controlled 

## 2010-08-04 NOTE — Assessment & Plan Note (Signed)
Neurology referral 

## 2010-08-26 ENCOUNTER — Ambulatory Visit (INDEPENDENT_AMBULATORY_CARE_PROVIDER_SITE_OTHER): Payer: Medicare Other | Admitting: Internal Medicine

## 2010-08-26 ENCOUNTER — Encounter: Payer: Self-pay | Admitting: Internal Medicine

## 2010-08-26 ENCOUNTER — Telehealth: Payer: Self-pay | Admitting: Cardiology

## 2010-08-26 DIAGNOSIS — L409 Psoriasis, unspecified: Secondary | ICD-10-CM

## 2010-08-26 DIAGNOSIS — R413 Other amnesia: Secondary | ICD-10-CM

## 2010-08-26 DIAGNOSIS — L509 Urticaria, unspecified: Secondary | ICD-10-CM

## 2010-08-26 DIAGNOSIS — L408 Other psoriasis: Secondary | ICD-10-CM

## 2010-08-26 NOTE — Telephone Encounter (Signed)
Per pt call pt needs all RX's faxed over to [facility]. Pt would also like someone to call him back to inform pt when RX's have been faxed.

## 2010-08-26 NOTE — Assessment & Plan Note (Signed)
Derm referral, he will be seen there in 2 days

## 2010-08-26 NOTE — Progress Notes (Signed)
Subjective:    Patient ID: [name], male    DOB: 04-Mar-1932, 77 y.o.   [name]: [id number]  Rash This is a recurrent problem. The current episode started more than 1 month ago. The problem has been gradually worsening since onset. The rash is diffuse. The rash is characterized by itchiness and scaling. He was exposed to nothing. Pertinent negatives include no anorexia, congestion, cough, diarrhea, eye pain, facial edema, fatigue, fever, joint pain, nail changes, rhinorrhea, shortness of breath, sore throat or vomiting. Past treatments include antihistamine and topical steroids. The treatment provided no relief.      Review of Systems  Constitutional: Negative for fever, fatigue and unexpected weight change.  HENT: Negative for congestion, sore throat, facial swelling, rhinorrhea, neck pain and neck stiffness.   Eyes: Negative for photophobia, pain, discharge, redness, itching and visual disturbance.  Respiratory: Negative for apnea, cough, choking, chest tightness, shortness of breath, wheezing and stridor.   Gastrointestinal: Negative.  Negative for vomiting, diarrhea and anorexia.  Genitourinary: Negative.   Musculoskeletal: Negative.  Negative for joint pain.  Skin: Positive for rash. Negative for nail changes, color change, pallor and wound.  Neurological: Negative.   Hematological: Negative.   Psychiatric/Behavioral: Negative.        Objective:   Physical Exam  Vitals reviewed. Constitutional: He is oriented to person, place, and time. He appears well-developed and well-nourished. No distress.  HENT:  Head: Normocephalic.  Right Ear: External ear normal.  Left Ear: External ear normal.  Nose: Nose normal.  Mouth/Throat: Oropharynx is clear and moist. No oropharyngeal exudate.  Eyes: Conjunctivae and EOM are normal. Pupils are equal, round, and reactive to light. Right eye exhibits no discharge. Left eye exhibits no discharge. No scleral icterus.  Neck: Normal range of  motion. Neck supple. No JVD present. No tracheal deviation present. No thyromegaly present.  Cardiovascular: Normal rate, regular rhythm, normal heart sounds and intact distal pulses.  Exam reveals no gallop and no friction rub.   No murmur heard. Pulmonary/Chest: Effort normal and breath sounds normal. No stridor. No respiratory distress. He has no wheezes. He has no rales. He exhibits no tenderness.  Abdominal: Soft. Bowel sounds are normal. He exhibits no distension and no mass. There is no tenderness. There is no rebound and no guarding.  Musculoskeletal: Normal range of motion. He exhibits no edema and no tenderness.  Lymphadenopathy:    He has no cervical adenopathy.  Neurological: He is alert and oriented to person, place, and time. He has normal reflexes.  Skin: Skin is warm, dry and intact. Rash noted. No abrasion, no bruising, no burn, no ecchymosis, no laceration, no lesion, no petechiae and no purpura noted. Rash is not macular, not papular, not maculopapular, not nodular, not pustular, not vesicular and not urticarial. He is not diaphoretic. No cyanosis or erythema. No pallor. Nails show no clubbing.          He has TNTC plaques with pinkish scale  Psychiatric: He has a normal mood and affect. His behavior is normal. Judgment and thought content normal.      Lab Results  Component Value Date   WBC 9.1 07/29/2010   HGB 14.0 07/29/2010   HCT 42.2 07/29/2010   PLT 186.0 07/29/2010   CHOL 208* 07/29/2010   TRIG 96.0 07/29/2010   HDL 80.30 07/29/2010   LDLDIRECT 120.7 07/29/2010   ALT 27 07/29/2010   AST 37 07/29/2010   NA 143 07/29/2010   K 4.1 07/29/2010   CL  104 07/29/2010   CREATININE 1.2 07/29/2010   BUN 29* 07/29/2010   CO2 30 07/29/2010   TSH 0.83 07/29/2010   PSA 1.07 12/10/2009   INR 2.0 06/10/2010      Assessment & Plan:

## 2010-08-26 NOTE — Assessment & Plan Note (Signed)
Neurology referral in the next 2 weeks

## 2010-08-27 ENCOUNTER — Ambulatory Visit (INDEPENDENT_AMBULATORY_CARE_PROVIDER_SITE_OTHER): Payer: Medicare Other | Admitting: *Deleted

## 2010-08-27 DIAGNOSIS — I4891 Unspecified atrial fibrillation: Secondary | ICD-10-CM

## 2010-08-27 LAB — POCT INR: INR: 2.2

## 2010-08-27 MED ORDER — AMLODIPINE BESYLATE 10 MG PO TABS: 10.0000 mg | ORAL_TABLET | Freq: Every day | ORAL | Status: DC

## 2010-08-27 MED ORDER — SIMVASTATIN 40 MG PO TABS: 10.0000 mg | ORAL_TABLET | Freq: Every day | ORAL | Status: DC

## 2010-08-27 MED ORDER — POTASSIUM CHLORIDE CRYS ER 20 MEQ PO TBCR: 20.0000 meq | EXTENDED_RELEASE_TABLET | Freq: Every day | ORAL | Status: DC

## 2010-08-27 MED ORDER — NITROGLYCERIN 0.4 MG SL SUBL: 0.4000 mg | SUBLINGUAL_TABLET | SUBLINGUAL | Status: AC | PRN

## 2010-08-27 MED ORDER — SIMVASTATIN 40 MG PO TABS: 20.0000 mg | ORAL_TABLET | Freq: Every day | ORAL | Status: DC

## 2010-09-18 ENCOUNTER — Encounter: Payer: Self-pay | Admitting: *Deleted

## 2010-09-24 ENCOUNTER — Ambulatory Visit (INDEPENDENT_AMBULATORY_CARE_PROVIDER_SITE_OTHER): Payer: Medicare Other | Admitting: *Deleted

## 2010-09-24 DIAGNOSIS — I4891 Unspecified atrial fibrillation: Secondary | ICD-10-CM

## 2010-09-24 LAB — POCT INR: INR: 2

## 2010-09-29 ENCOUNTER — Encounter: Payer: Self-pay | Admitting: Internal Medicine

## 2010-09-29 ENCOUNTER — Ambulatory Visit (INDEPENDENT_AMBULATORY_CARE_PROVIDER_SITE_OTHER): Payer: Medicare Other | Admitting: *Deleted

## 2010-09-29 DIAGNOSIS — I498 Other specified cardiac arrhythmias: Secondary | ICD-10-CM

## 2010-09-29 LAB — PACEMAKER DEVICE OBSERVATION
BMOD-0002RV: 8
BMOD-0003RV: 30
BMOD-0004RV: 2
DEVICE MODEL PM: 124041
RV LEAD THRESHOLD: 1.4 V
RV LEAD THRESHOLD: 1.7 V

## 2010-09-29 NOTE — Progress Notes (Signed)
PPM check 

## 2010-10-22 ENCOUNTER — Ambulatory Visit (INDEPENDENT_AMBULATORY_CARE_PROVIDER_SITE_OTHER): Payer: Medicare Other | Admitting: *Deleted

## 2010-10-22 DIAGNOSIS — I4891 Unspecified atrial fibrillation: Secondary | ICD-10-CM

## 2010-10-27 ENCOUNTER — Telehealth: Payer: Self-pay | Admitting: Cardiology

## 2010-10-27 MED ORDER — AMLODIPINE BESYLATE 10 MG PO TABS: 10.0000 mg | ORAL_TABLET | Freq: Every day | ORAL | Status: DC

## 2010-10-27 MED ORDER — HYDROCHLOROTHIAZIDE 25 MG PO TABS: 25.0000 mg | ORAL_TABLET | Freq: Every day | ORAL | Status: DC

## 2010-10-27 MED ORDER — LISINOPRIL 20 MG PO TABS: 20.0000 mg | ORAL_TABLET | Freq: Every day | ORAL | Status: DC

## 2010-10-27 NOTE — Telephone Encounter (Signed)
Pt wants lisinipril , hctz, nisoldipine  [facility] supply

## 2010-11-10 ENCOUNTER — Ambulatory Visit (INDEPENDENT_AMBULATORY_CARE_PROVIDER_SITE_OTHER): Payer: Medicare Other | Admitting: Cardiology

## 2010-11-10 ENCOUNTER — Encounter: Payer: Self-pay | Admitting: Cardiology

## 2010-11-10 VITALS — BP 120/78 | HR 73 | Ht 72.0 in | Wt 193.8 lb

## 2010-11-10 DIAGNOSIS — I2510 Atherosclerotic heart disease of native coronary artery without angina pectoris: Secondary | ICD-10-CM

## 2010-11-10 DIAGNOSIS — I4891 Unspecified atrial fibrillation: Secondary | ICD-10-CM

## 2010-11-10 DIAGNOSIS — I10 Essential (primary) hypertension: Secondary | ICD-10-CM

## 2010-11-10 DIAGNOSIS — E785 Hyperlipidemia, unspecified: Secondary | ICD-10-CM

## 2010-11-10 NOTE — Progress Notes (Signed)
HPI The patient returns for followup of his known coronary disease. Since I last saw him he has had no new cardiovascular complaints. The patient denies any new symptoms such as chest discomfort, neck or arm discomfort. There has been no new shortness of breath, PND or orthopnea. There have been no reported palpitations, presyncope or syncope.  He has been more fatigued over the past three weeks.  However, this is the same amount of time that he has had some problems with gout and has been less active walking.  He sleeps well.    No Known Allergies  Current Outpatient Prescriptions  Medication Sig Dispense Refill  . amLODipine (NORVASC) 10 MG tablet Take 1 tablet (10 mg total) by mouth daily.  90 tablet  2  . hydrochlorothiazide (HYDRODIURIL) 25 MG tablet Take 1 tablet (25 mg total) by mouth daily.  90 tablet  3  . lisinopril (PRINIVIL,ZESTRIL) 20 MG tablet Take 1 tablet (20 mg total) by mouth daily.  90 tablet  3  . nitroGLYCERIN (NITROSTAT) 0.4 MG SL tablet Place 1 tablet (0.4 mg total) under the tongue every 5 (five) minutes as needed. For chest pain--may repeat three times.  25 tablet  8  . potassium chloride SA (K-DUR,KLOR-CON) 20 MEQ tablet Take 1 tablet (20 mEq total) by mouth daily.  90 tablet  2  . simvastatin (ZOCOR) 40 MG tablet Take 0.5 tablets (20 mg total) by mouth daily.  45 tablet  2  . warfarin (COUMADIN) 5 MG tablet Take 5 mg by mouth as directed. 7.5 mg taken on Mon, Wed & Fri--By Coumadin clinic.        Past Medical History  Diagnosis Date  . ASCVD (arteriosclerotic cardiovascular disease)   . Decreased left ventricular function   . Ventricular hypokinesis     severe/basilar & mid-inferior and inferoseptal segments  . Scarring     cardio/inferior, inferolateral & inferoapical w/o ischemia  . Cerebrovascular disease 01/2004    right carotid bruit, minimal plaque on [place]   . Sick sinus syndrome 08/2004    Sinus Bradycardia & AFib  . Chronic anticoagulation   . History of  tobacco abuse     20 pack years; d/c in 1983  . Other and unspecified hyperlipidemia   . Gout, unspecified   . Unspecified essential hypertension   . Vertigo   . Osteoarthritis   . Pacemaker 08/2004    dual-chamber Guidant    Past Surgical History  Procedure Date  . Transurethral resection of prostate   . Anterior fusion cervical spine 2006    LS [2003]; prior cervical spine procedure w/implantation of hardware[[  . Cholecystectomy   . Coronary artery bypass graft 1983    three times  . Appendectomy   . Knee surgery     Left, twice  . Pacemaker placement 08/2004    Guidant implant    ROS:  As stated in the HPI and negative for all other systems.  PHYSICAL EXAM BP 120/78  Pulse 73  Ht 6' (1.829 m)  Wt 193 lb 12.8 oz (87.907 kg)  BMI 26.28 kg/m2 GENERAL:  Well appearing HEENT:  Pupils equal round and reactive, fundi not visualized, oral mucosa unremarkable NECK:  No jugular venous distention, waveform within normal limits, carotid upstroke brisk and symmetric, no bruits, no thyromegaly LYMPHATICS:  No cervical, inguinal adenopathy LUNGS:  Clear to auscultation bilaterally BACK:  No CVA tenderness CHEST:  Well healed sternotomy scar, pacemaker pocket HEART:  PMI not displaced or sustained,S1 and  S2 within normal limits, no S3, no S4, no clicks, no rubs, no murmurs ABD:  Flat, positive bowel sounds normal in frequency in pitch, no bruits, no rebound, no guarding, no midline pulsatile mass, no hepatomegaly, no splenomegaly EXT:  2 plus pulses throughout, no edema, no cyanosis no clubbing SKIN:  Psoriatic rash,  no nodules NEURO:  Cranial nerves II through XII grossly intact, motor grossly intact throughout PSYCH:  Cognitively intact, oriented to person place and time  EKG:  Atrial fibrillation, ventricular pacing.  ASSESSMENT AND PLAN

## 2010-11-10 NOTE — Patient Instructions (Signed)
Follow up in 1 year with Dr Hochrein.  You will receive a letter in the mail 2 months before you are due.  Please call us when you receive this letter to schedule your follow up appointment.  The current medical regimen is effective;  continue present plan and medications.  

## 2010-11-10 NOTE — Assessment & Plan Note (Signed)
Lab Results  Component Value Date   CHOL 208* 07/29/2010   HDL 80.30 07/29/2010   LDLCALC 113* 12/10/2009   LDLDIRECT 120.7 07/29/2010   TRIG 96.0 07/29/2010   CHOLHDL 3 07/29/2010   I will try to get him to switch to Lipitor 40 mg as he is not at target.  He can get a lipid profile in 8 weeks after this.

## 2010-11-10 NOTE — Assessment & Plan Note (Signed)
The blood pressure is at target. No change in medications is indicated. We will continue with therapeutic lifestyle changes (TLC).  

## 2010-11-10 NOTE — Assessment & Plan Note (Signed)
He is up to date with pacer follow up and has no symptoms related to the fibrillation.  He tolerates the warfarin.  He will continue with meds as listed.

## 2010-11-10 NOTE — Assessment & Plan Note (Signed)
The patient has no new sypmtoms.  No further cardiovascular testing is indicated.  We will continue with aggressive risk reduction and meds as listed.  

## 2010-11-14 ENCOUNTER — Ambulatory Visit: Payer: Medicare Other | Admitting: Cardiology

## 2010-11-19 ENCOUNTER — Ambulatory Visit (INDEPENDENT_AMBULATORY_CARE_PROVIDER_SITE_OTHER): Payer: Medicare Other | Admitting: *Deleted

## 2010-11-19 DIAGNOSIS — I4891 Unspecified atrial fibrillation: Secondary | ICD-10-CM

## 2010-11-19 DIAGNOSIS — Z7901 Long term (current) use of anticoagulants: Secondary | ICD-10-CM

## 2010-11-26 ENCOUNTER — Ambulatory Visit (INDEPENDENT_AMBULATORY_CARE_PROVIDER_SITE_OTHER): Payer: Medicare Other | Admitting: *Deleted

## 2010-11-26 DIAGNOSIS — Z7901 Long term (current) use of anticoagulants: Secondary | ICD-10-CM

## 2010-11-26 DIAGNOSIS — I4891 Unspecified atrial fibrillation: Secondary | ICD-10-CM

## 2010-12-03 ENCOUNTER — Encounter: Payer: Medicare Other | Admitting: *Deleted

## 2010-12-05 ENCOUNTER — Ambulatory Visit (INDEPENDENT_AMBULATORY_CARE_PROVIDER_SITE_OTHER): Payer: Medicare Other | Admitting: *Deleted

## 2010-12-05 DIAGNOSIS — I4891 Unspecified atrial fibrillation: Secondary | ICD-10-CM

## 2010-12-05 DIAGNOSIS — Z7901 Long term (current) use of anticoagulants: Secondary | ICD-10-CM

## 2010-12-26 ENCOUNTER — Ambulatory Visit (INDEPENDENT_AMBULATORY_CARE_PROVIDER_SITE_OTHER): Payer: Medicare Other | Admitting: *Deleted

## 2010-12-26 DIAGNOSIS — I4891 Unspecified atrial fibrillation: Secondary | ICD-10-CM

## 2010-12-26 DIAGNOSIS — Z7901 Long term (current) use of anticoagulants: Secondary | ICD-10-CM

## 2011-01-22 ENCOUNTER — Telehealth: Payer: Self-pay | Admitting: *Deleted

## 2011-01-22 ENCOUNTER — Encounter: Payer: Self-pay | Admitting: *Deleted

## 2011-01-22 NOTE — Telephone Encounter (Signed)
Mailed letter to pt requesting he consider Lipitor 20 mg a day.  Requesting he call us back to let us know.

## 2011-01-22 NOTE — Telephone Encounter (Signed)
Message copied by Sharin Grave on Thu Jan 22, 2011  9:24 AM ------      Message from: Rollene Rotunda      Created: Mon Nov 10, 2010  1:31 PM       Can you tell him I was looking at the old lipid in the office.  His LDL was 120 and should be less than 100.  Would he consider a switch to 20 mg lipitor.

## 2011-01-23 ENCOUNTER — Ambulatory Visit (INDEPENDENT_AMBULATORY_CARE_PROVIDER_SITE_OTHER): Payer: Medicare Other | Admitting: *Deleted

## 2011-01-23 DIAGNOSIS — I4891 Unspecified atrial fibrillation: Secondary | ICD-10-CM

## 2011-01-23 DIAGNOSIS — Z7901 Long term (current) use of anticoagulants: Secondary | ICD-10-CM

## 2011-01-23 LAB — POCT INR: INR: 3

## 2011-02-09 ENCOUNTER — Telehealth: Payer: Self-pay | Admitting: Cardiology

## 2011-02-09 NOTE — Telephone Encounter (Signed)
Message left advising patient that it was not necessary to call the office, just have labs drawn at [facility].

## 2011-02-09 NOTE — Telephone Encounter (Signed)
Patient states that he received letter regarding lab work results and said that it had for him to call office. / tg

## 2011-02-10 ENCOUNTER — Telehealth: Payer: Self-pay | Admitting: Cardiology

## 2011-02-10 DIAGNOSIS — E782 Mixed hyperlipidemia: Secondary | ICD-10-CM

## 2011-02-10 MED ORDER — ATORVASTATIN CALCIUM 40 MG PO TABS: 40.0000 mg | ORAL_TABLET | Freq: Every day | ORAL | Status: DC

## 2011-02-10 NOTE — Telephone Encounter (Signed)
Pt aware of results and medications to be started.  He will fasting blood work 04/07/11

## 2011-02-10 NOTE — Telephone Encounter (Signed)
Fu call Pt calling back about letter he received about lipitor he said he has been trying for two days to reach you

## 2011-02-10 NOTE — Telephone Encounter (Signed)
New Problem:     Patient called in regards to a letter he was sent. Please call back.

## 2011-02-20 ENCOUNTER — Ambulatory Visit (INDEPENDENT_AMBULATORY_CARE_PROVIDER_SITE_OTHER): Payer: Medicare Other | Admitting: *Deleted

## 2011-02-20 ENCOUNTER — Encounter: Payer: Self-pay | Admitting: Internal Medicine

## 2011-02-20 DIAGNOSIS — I4891 Unspecified atrial fibrillation: Secondary | ICD-10-CM

## 2011-02-20 DIAGNOSIS — Z7901 Long term (current) use of anticoagulants: Secondary | ICD-10-CM

## 2011-02-20 DIAGNOSIS — Z950 Presence of cardiac pacemaker: Secondary | ICD-10-CM

## 2011-02-20 LAB — PACEMAKER DEVICE OBSERVATION
BMOD-0002RV: 8
BMOD-0004RV: 2
RV LEAD AMPLITUDE: 12 mv
RV LEAD IMPEDENCE PM: 580 Ohm

## 2011-02-20 NOTE — Progress Notes (Signed)
Pacer check in clinic  

## 2011-03-02 ENCOUNTER — Other Ambulatory Visit: Payer: Self-pay | Admitting: Cardiology

## 2011-03-02 NOTE — Telephone Encounter (Signed)
New refill Pt wanted refill of simvastatin and potassium [facility] supply 3 refills please let him know when done

## 2011-03-03 ENCOUNTER — Other Ambulatory Visit: Payer: Self-pay | Admitting: *Deleted

## 2011-03-03 DIAGNOSIS — E782 Mixed hyperlipidemia: Secondary | ICD-10-CM

## 2011-03-03 MED ORDER — POTASSIUM CHLORIDE CRYS ER 20 MEQ PO TBCR: 20.0000 meq | EXTENDED_RELEASE_TABLET | Freq: Every day | ORAL | Status: DC

## 2011-03-03 MED ORDER — ATORVASTATIN CALCIUM 40 MG PO TABS: 40.0000 mg | ORAL_TABLET | Freq: Every day | ORAL | Status: DC

## 2011-03-03 NOTE — Telephone Encounter (Signed)
FU Call: pt needs refill of simvastatin and potassium called into [facility] called into [facility]. Please call RX in ASAP.

## 2011-03-06 ENCOUNTER — Ambulatory Visit (INDEPENDENT_AMBULATORY_CARE_PROVIDER_SITE_OTHER): Payer: Medicare Other

## 2011-03-06 DIAGNOSIS — I4891 Unspecified atrial fibrillation: Secondary | ICD-10-CM

## 2011-03-06 DIAGNOSIS — Z7901 Long term (current) use of anticoagulants: Secondary | ICD-10-CM

## 2011-03-27 ENCOUNTER — Ambulatory Visit (INDEPENDENT_AMBULATORY_CARE_PROVIDER_SITE_OTHER): Payer: Medicare Other | Admitting: *Deleted

## 2011-03-27 DIAGNOSIS — I4891 Unspecified atrial fibrillation: Secondary | ICD-10-CM

## 2011-03-27 DIAGNOSIS — Z7901 Long term (current) use of anticoagulants: Secondary | ICD-10-CM

## 2011-03-27 LAB — POCT INR: INR: 3.2

## 2011-04-07 ENCOUNTER — Encounter: Payer: Self-pay | Admitting: Internal Medicine

## 2011-04-07 ENCOUNTER — Ambulatory Visit (INDEPENDENT_AMBULATORY_CARE_PROVIDER_SITE_OTHER): Payer: Medicare Other | Admitting: Internal Medicine

## 2011-04-07 ENCOUNTER — Ambulatory Visit (INDEPENDENT_AMBULATORY_CARE_PROVIDER_SITE_OTHER): Payer: Medicare Other | Admitting: *Deleted

## 2011-04-07 VITALS — BP 110/60 | HR 60 | Ht 71.0 in | Wt 172.0 lb

## 2011-04-07 DIAGNOSIS — I495 Sick sinus syndrome: Secondary | ICD-10-CM

## 2011-04-07 DIAGNOSIS — I10 Essential (primary) hypertension: Secondary | ICD-10-CM

## 2011-04-07 DIAGNOSIS — E785 Hyperlipidemia, unspecified: Secondary | ICD-10-CM

## 2011-04-07 DIAGNOSIS — Z950 Presence of cardiac pacemaker: Secondary | ICD-10-CM

## 2011-04-07 LAB — PACEMAKER DEVICE OBSERVATION
BMOD-0002RV: 8
BMOD-0004RV: 2
BRDY-0002RV: 60 {beats}/min
RV LEAD AMPLITUDE: 9.3 mv
RV LEAD IMPEDENCE PM: 560 Ohm
RV LEAD THRESHOLD: 1.5 V

## 2011-04-07 LAB — HEPATIC FUNCTION PANEL
AST: 55 U/L — ABNORMAL HIGH (ref 0–37)
Alkaline Phosphatase: 52 U/L (ref 39–117)
Bilirubin, Direct: 0.2 mg/dL (ref 0.0–0.3)
Total Bilirubin: 1 mg/dL (ref 0.3–1.2)

## 2011-04-07 LAB — LIPID PANEL
HDL: 57.2 mg/dL (ref >39.00)
LDL Cholesterol: 59 mg/dL (ref 0–99)
VLDL: 18.6 mg/dL (ref 0.0–40.0)

## 2011-04-07 NOTE — Assessment & Plan Note (Signed)
His blood pressure is well controlled. He will continue his current medical therapy and maintain a low-sodium diet. 

## 2011-04-07 NOTE — Patient Instructions (Signed)
Your physician wants you to follow-up in: 6 months in the device clinic and 12 months with Dr Taylor You will receive a reminder letter in the mail two months in advance. If you don't receive a letter, please call our office to schedule the follow-up appointment.  

## 2011-04-07 NOTE — Assessment & Plan Note (Signed)
His pacemaker is working normally. He is approximately 2 years out from elective replacement.

## 2011-04-07 NOTE — Progress Notes (Signed)
HPI [name] returns today for followup. He is a very pleasant 78 year old man with symptomatic bradycardia status post pacemaker insertion. His blood pressures have been well controlled. He denies chest pain or shortness of breath. No syncope. No Known Allergies   Current Outpatient Prescriptions  Medication Sig Dispense Refill  . amLODipine (NORVASC) 10 MG tablet Take 1 tablet (10 mg total) by mouth daily.  90 tablet  2  . atorvastatin (LIPITOR) 40 MG tablet Take 1 tablet (40 mg total) by mouth daily.  90 tablet  3  . gabapentin (NEURONTIN) 100 MG capsule Take 100 mg by mouth 3 (three) times daily.      . hydrochlorothiazide (HYDRODIURIL) 25 MG tablet Take 1 tablet (25 mg total) by mouth daily.  90 tablet  3  . lisinopril (PRINIVIL,ZESTRIL) 20 MG tablet Take 1 tablet (20 mg total) by mouth daily.  90 tablet  3  . nitroGLYCERIN (NITROSTAT) 0.4 MG SL tablet Place 1 tablet (0.4 mg total) under the tongue every 5 (five) minutes as needed. For chest pain--may repeat three times.  25 tablet  8  . potassium chloride SA (K-DUR,KLOR-CON) 20 MEQ tablet Take 1 tablet (20 mEq total) by mouth daily.  90 tablet  3  . simvastatin (ZOCOR) 40 MG tablet Take 40 mg by mouth every evening.      . traMADol (ULTRAM) 50 MG tablet Take 50 mg by mouth every 6 (six) hours as needed.      . warfarin (COUMADIN) 5 MG tablet Take 5 mg by mouth as directed. 7.5 mg taken on Mon, Wed & Fri--By Coumadin clinic.         Past Medical History  Diagnosis Date  . ASCVD (arteriosclerotic cardiovascular disease)   . Decreased left ventricular function   . Ventricular hypokinesis     severe/basilar & mid-inferior and inferoseptal segments  . Scarring     cardio/inferior, inferolateral & inferoapical w/o ischemia  . Cerebrovascular disease 01/2004    right carotid bruit, minimal plaque on [place]   . Sick sinus syndrome 08/2004    Sinus Bradycardia & AFib  . Chronic anticoagulation   . History of tobacco abuse     20 pack  years; d/c in 1983  . Other and unspecified hyperlipidemia   . Gout, unspecified   . Unspecified essential hypertension   . Vertigo   . Osteoarthritis   . Pacemaker 08/2004    dual-chamber Guidant  . CAD (coronary artery disease)   . HTN (hypertension)     ROS:   All systems reviewed and negative except as noted in the HPI.   Past Surgical History  Procedure Date  . Transurethral resection of prostate   . Anterior fusion cervical spine 2006    LS [2003]; prior cervical spine procedure w/implantation of hardware[[[  . Cholecystectomy   . Coronary artery bypass graft 1983    three times  . Appendectomy   . Knee surgery     Left, twice  . Pacemaker placement 08/2004    Guidant implant  . Neck surgery   . Back surgery   . Bladder surgery      No family history on file.   History   Social History  . Marital Status: Divorced    Spouse Name: N/A    Number of Children: N/A  . Years of Education: N/A   Occupational History  . Retired     [employer] Department   Social History Main Topics  . Smoking status: Former Games developer  .  Smokeless tobacco: Not on file  . Alcohol Use: No  . Drug Use: No  . Sexually Active: Yes   Other Topics Concern  . Not on file   Social History Narrative   Retired [employer] DepartmentDivorced-RemarriedRegular Exercise-yes     BP 110/60  Pulse 60  Ht 5\' 11"  (1.803 m)  Wt 78.019 kg (172 lb)  BMI 23.99 kg/m2  Physical Exam:  Well appearing 78 year old man, NAD HEENT: Unremarkable Neck:  No JVD, no thyromegally Lymphatics:  No adenopathy Back:  No CVA tenderness Lungs:  Clear with no wheezes, rales, or rhonchi. HEART:  Regular rate rhythm, no murmurs, no rubs, no clicks Abd:  soft, positive bowel sounds, no organomegally, no rebound, no guarding Ext:  2 plus pulses, no edema, no cyanosis, no clubbing Skin:  No rashes no nodules Neuro:  CN II through XII intact, motor grossly intact   DEVICE  Normal device function.  See  PaceArt for details. Underlying rhythm is complete heart block  Assess/Plan:

## 2011-04-21 ENCOUNTER — Encounter: Payer: Self-pay | Admitting: *Deleted

## 2011-04-21 ENCOUNTER — Telehealth: Payer: Self-pay | Admitting: Internal Medicine

## 2011-04-21 NOTE — Telephone Encounter (Signed)
Received copies from [facility] specialist ,on 04/21/11. Forwarded 1pages to Dr.Jones ,for review.

## 2011-04-24 ENCOUNTER — Ambulatory Visit (INDEPENDENT_AMBULATORY_CARE_PROVIDER_SITE_OTHER): Payer: Medicare Other

## 2011-04-24 DIAGNOSIS — I4891 Unspecified atrial fibrillation: Secondary | ICD-10-CM

## 2011-04-24 DIAGNOSIS — Z7901 Long term (current) use of anticoagulants: Secondary | ICD-10-CM

## 2011-04-24 LAB — POCT INR: INR: 3.9

## 2011-05-08 ENCOUNTER — Ambulatory Visit (INDEPENDENT_AMBULATORY_CARE_PROVIDER_SITE_OTHER): Payer: Medicare Other

## 2011-05-08 DIAGNOSIS — Z7901 Long term (current) use of anticoagulants: Secondary | ICD-10-CM

## 2011-05-08 DIAGNOSIS — I4891 Unspecified atrial fibrillation: Secondary | ICD-10-CM

## 2011-05-29 ENCOUNTER — Ambulatory Visit (INDEPENDENT_AMBULATORY_CARE_PROVIDER_SITE_OTHER): Payer: Medicare Other | Admitting: Pharmacist

## 2011-05-29 DIAGNOSIS — I4891 Unspecified atrial fibrillation: Secondary | ICD-10-CM

## 2011-05-29 DIAGNOSIS — Z7901 Long term (current) use of anticoagulants: Secondary | ICD-10-CM

## 2011-06-12 ENCOUNTER — Ambulatory Visit (INDEPENDENT_AMBULATORY_CARE_PROVIDER_SITE_OTHER): Payer: Medicare Other

## 2011-06-12 DIAGNOSIS — I4891 Unspecified atrial fibrillation: Secondary | ICD-10-CM

## 2011-06-12 DIAGNOSIS — Z7901 Long term (current) use of anticoagulants: Secondary | ICD-10-CM

## 2011-06-12 LAB — POCT INR: INR: 1.8

## 2011-07-03 ENCOUNTER — Ambulatory Visit (INDEPENDENT_AMBULATORY_CARE_PROVIDER_SITE_OTHER): Payer: Medicare Other | Admitting: Pharmacist

## 2011-07-03 DIAGNOSIS — Z7901 Long term (current) use of anticoagulants: Secondary | ICD-10-CM

## 2011-07-03 DIAGNOSIS — I4891 Unspecified atrial fibrillation: Secondary | ICD-10-CM

## 2011-07-24 ENCOUNTER — Ambulatory Visit (INDEPENDENT_AMBULATORY_CARE_PROVIDER_SITE_OTHER): Payer: Medicare Other

## 2011-07-24 DIAGNOSIS — Z7901 Long term (current) use of anticoagulants: Secondary | ICD-10-CM

## 2011-07-24 DIAGNOSIS — I4891 Unspecified atrial fibrillation: Secondary | ICD-10-CM

## 2011-08-11 ENCOUNTER — Other Ambulatory Visit: Payer: Self-pay | Admitting: Internal Medicine

## 2011-08-11 ENCOUNTER — Other Ambulatory Visit: Payer: Self-pay | Admitting: *Deleted

## 2011-08-11 MED ORDER — WARFARIN SODIUM 5 MG PO TABS: 5.0000 mg | ORAL_TABLET | ORAL | Status: DC

## 2011-08-11 MED ORDER — AMLODIPINE BESYLATE 10 MG PO TABS: 10.0000 mg | ORAL_TABLET | Freq: Every day | ORAL | Status: DC

## 2011-08-11 MED ORDER — HYDROCHLOROTHIAZIDE 25 MG PO TABS: 25.0000 mg | ORAL_TABLET | Freq: Every day | ORAL | Status: DC

## 2011-08-11 MED ORDER — LISINOPRIL 20 MG PO TABS: 20.0000 mg | ORAL_TABLET | Freq: Every day | ORAL | Status: DC

## 2011-08-12 ENCOUNTER — Other Ambulatory Visit: Payer: Self-pay | Admitting: Internal Medicine

## 2011-08-17 ENCOUNTER — Encounter: Payer: Self-pay | Admitting: Internal Medicine

## 2011-08-17 ENCOUNTER — Ambulatory Visit (INDEPENDENT_AMBULATORY_CARE_PROVIDER_SITE_OTHER): Payer: Medicare Other | Admitting: Internal Medicine

## 2011-08-17 ENCOUNTER — Other Ambulatory Visit (INDEPENDENT_AMBULATORY_CARE_PROVIDER_SITE_OTHER): Payer: Medicare Other

## 2011-08-17 VITALS — BP 120/78 | HR 70 | Temp 97.8°F | Resp 14 | Wt 180.5 lb

## 2011-08-17 DIAGNOSIS — N138 Other obstructive and reflux uropathy: Secondary | ICD-10-CM

## 2011-08-17 DIAGNOSIS — N390 Urinary tract infection, site not specified: Secondary | ICD-10-CM | POA: Insufficient documentation

## 2011-08-17 DIAGNOSIS — E785 Hyperlipidemia, unspecified: Secondary | ICD-10-CM

## 2011-08-17 DIAGNOSIS — I4891 Unspecified atrial fibrillation: Secondary | ICD-10-CM

## 2011-08-17 DIAGNOSIS — I10 Essential (primary) hypertension: Secondary | ICD-10-CM

## 2011-08-17 DIAGNOSIS — Z0000 Encounter for general adult medical examination without abnormal findings: Secondary | ICD-10-CM

## 2011-08-17 DIAGNOSIS — N410 Acute prostatitis: Secondary | ICD-10-CM

## 2011-08-17 DIAGNOSIS — Z7901 Long term (current) use of anticoagulants: Secondary | ICD-10-CM

## 2011-08-17 DIAGNOSIS — N403 Nodular prostate with lower urinary tract symptoms: Secondary | ICD-10-CM

## 2011-08-17 LAB — CBC WITH DIFFERENTIAL/PLATELET
Basophils Relative: 0.3 % (ref 0.0–3.0)
Eosinophils Absolute: 0.1 10*3/uL (ref 0.0–0.7)
Lymphocytes Relative: 17.4 % (ref 12.0–46.0)
MCHC: 33.6 g/dL (ref 30.0–36.0)
Neutrophils Relative %: 71.9 % (ref 43.0–77.0)
RBC: 4.09 Mil/uL — ABNORMAL LOW (ref 4.22–5.81)
WBC: 7.4 10*3/uL (ref 4.5–10.5)

## 2011-08-17 LAB — COMPREHENSIVE METABOLIC PANEL
ALT: 26 U/L (ref 0–53)
AST: 33 U/L (ref 0–37)
Albumin: 4.2 g/dL (ref 3.5–5.2)
Alkaline Phosphatase: 60 U/L (ref 39–117)
Glucose, Bld: 98 mg/dL (ref 70–99)
Potassium: 3.7 mEq/L (ref 3.5–5.1)
Sodium: 139 mEq/L (ref 135–145)
Total Protein: 6.7 g/dL (ref 6.0–8.3)

## 2011-08-17 LAB — URINALYSIS, ROUTINE W REFLEX MICROSCOPIC
Total Protein, Urine: NEGATIVE
Urine Glucose: NEGATIVE

## 2011-08-17 LAB — LIPID PANEL
LDL Cholesterol: 80 mg/dL (ref 0–99)
Total CHOL/HDL Ratio: 3
VLDL: 17.6 mg/dL (ref 0.0–40.0)

## 2011-08-17 LAB — POCT URINALYSIS DIPSTICK
Glucose, UA: NEGATIVE
Nitrite, UA: NEGATIVE
Spec Grav, UA: 1.02
Urobilinogen, UA: 0.2

## 2011-08-17 LAB — PSA: PSA: 0.77 ng/mL (ref 0.10–4.00)

## 2011-08-17 LAB — PROTIME-INR
INR: 1.9 ratio — ABNORMAL HIGH (ref 0.8–1.0)
Prothrombin Time: 20.6 s — ABNORMAL HIGH (ref 10.2–12.4)

## 2011-08-17 MED ORDER — SULFAMETHOXAZOLE-TRIMETHOPRIM 800-160 MG PO TABS: 1.0000 | ORAL_TABLET | Freq: Two times a day (BID) | ORAL | Status: DC

## 2011-08-17 MED ORDER — TAMSULOSIN HCL 0.4 MG PO CAPS: 0.4000 mg | ORAL_CAPSULE | Freq: Every day | ORAL | Status: DC

## 2011-08-17 MED ORDER — ALLOPURINOL 100 MG PO TABS: 100.0000 mg | ORAL_TABLET | Freq: Every day | ORAL | Status: DC

## 2011-08-17 NOTE — Patient Instructions (Signed)
Prostatitis Prostatitis is an inflammation (the body's way of reacting to injury and/or infection) of the prostate gland. The prostate gland is a male organ. The gland is about the size and shape of a walnut. The prostate is located just below the bladder. It produces semen, which is a fluid that helps nourish and transport sperm. Prostatitis is the most common urinary tract problem in men younger than age 76. There are 4 categories of prostatitis:  I - Acute bacterial prostatitis.   II - Chronic bacterial prostatitis.   III - Chronic prostatitis and chronic pelvic pain syndrome (CPPS).   Inflammatory.   Non inflammatory.   IV - Asymptomatic inflammatory prostatitis.  Acute and chronic bacterial prostatitis are problems with bacterial infections of the prostate. "Acute" infection is usually a one-time problem. "Chronic" bacterial prostatitis is a condition with recurrent infection. It is usually caused by the same germ(bacteria). CPPS has symptoms similar to prostate infection. However, no infection is actually found. This condition can cause problems of ongoing pain. Currently, it cannot be cured. Treatments are available and aimed at symptom control.  Asymptomatic inflammatory prostatitis has no symptoms. It is a condition where infection-fighting cells are found by chance in the urine. The diagnosis is made most often during an exam for other conditions. Other conditions could be infertility or a high level of PSA (prostate-specific antigen) in the blood. SYMPTOMS  Symptoms can vary depending upon the type of prostatitis that exists. There can also be overlap in symptoms. This can make diagnosis difficult. Symptoms: For Acute bacterial prostatitis  Painful urination.   Fever or chills.   Muscle or joint pains.   Low back pain.   Low abdominal pain.   Inability to empty bladder completely.   Sudden urges to urinate.   Frequent urination during the day.   Difficulty starting  urine stream.   Need to urinate several times at night (nocturia).   Weak urine stream.   Urethral (tube that carries urine from the bladder out of the body) discharge and dribbling after urination.  For Chronic bacterial prostatitis  Rectal pain.   Pain in the testicles, penis, or tip of the penis.   Pain in the space between the anus and scrotum (perineum).   Low back pain.   Low abdominal pain.   Problems with sexual function.   Painful ejaculation.   Bloody semen.   Inability to empty bladder completely.   Painful urination.   Sudden urges to urinate.   Frequent urination during the day.   Difficulty starting urine stream.   Need to urinate several times at night (nocturia).   Weak urine stream.   Dribbling after urination.   Urethral discharge.  For Chronic prostatitis and chronic pelvic pain syndrome (CPPS) Symptoms are the same as those for chronic bacterial prostatitis. Problems with sexual function are often the reason for seeking care. This important problem should be discussed with your caregiver. For Asymptomatic inflammatory prostatitis As noted above, there are no symptoms with this condition. DIAGNOSIS   Your caregiver may perform a rectal exam. This exam is to determine if the prostate is swollen and tender.   Sometimes blood work is performed. This is done to see if your white blood cell count is elevated. The Prostate Specific Antigen (PSA) is also measured. PSA is a blood test that can help detect early prostate cancer.   A urinalysis is done to find out what type of infection is present if this is a suspected cause. An   additional urinalysis may be done after a digital rectal exam. This is to see if white blood cells are pushed out of the prostate and into the urine. A low-grade infection of the prostate may not be found on the first urinalysis.  In more difficult cases, your caregiver may advise other tests. Tests could include:  Urodynamics  -- Tests the function of the bladder and the organs involved in triggering and controlling normal urination.   Urine flow rate.   Cystoscopy -- In this procedure, a thin, telescope-like tube with a light and tiny camera attached (cystoscope) is inserted into the bladder through the urethra. This allows the caregiver to see the inside of the urethra and bladder.   Electromyography -- This procedure tests how the muscles and nerves of the bladder work. It is focused on the muscles that control the anus and pelvic floor. These are the muscles between the anus and scrotum.  In people who show no signs of infection, certain uncommon infections might be causing constant or recurrent symptoms. These uncommon infections are difficult to detect. More work in medicine may help find solutions to these problems. TREATMENT  Antibiotics are used to treat infections caused by germs. If the infection is not treated and becomes long lasting (chronic), it may become a lower grade infection with minor, continual problems. Without treatment, the prostate may develop a boil or furuncle (abscess). This may require surgical treatment. For those with chronic prostatitis and CPPS, it is important to work closely with your primary caregiver and urologist. For some, the medicines that are used to treat a non-cancerous, enlarged prostate (benign prostatic hypertrophy) may be helpful. Referrals to specialists other than urologists may be necessary. In rare cases when all treatments have been inadequate for pain control, an operation to remove the prostate may be recommended. This is very rare and before this is considered thorough discussion with your urologist is highly recommended.  In cases of secondary to chronic non-bacterial prostatitis, a good relationship with your urologist or primary caregiver is essential because it is often a recurrent prolonged condition that requires a good understanding of the causes and a commitment  to therapy aimed at controlling your symptoms. HOME CARE INSTRUCTIONS   Hot sitz baths for 20 minutes, 4 times per day, may help relieve pain.   Non-prescription pain killers may be used as your caregiver recommends if you have no allergies to them. Some illnesses or conditions prevent use of non-prescription drugs. If unsure, check with your caregiver. Take all medications as directed. Take the antibiotics for the prescribed length of time, even if you are feeling better.  SEEK MEDICAL CARE IF:   You have any worsening of the symptoms that originally brought you to your caregiver.   You have an oral temperature above 102 F (38.9 C).   You experience any side effects from medications prescribed.  SEEK IMMEDIATE MEDICAL CARE IF:   You have an oral temperature above 102 F (38.9 C), not controlled by medicine.   You have pain not relieved with medications.   You develop nausea, vomiting, lightheadedness, or have a fainting episode.   You are unable to urinate.   You pass bloody urine or clots.  Document Released: 12/27/1999 Document Revised: 12/18/2010 Document Reviewed: 12/01/2010 ExitCare Patient Information 2012 [employer]. 

## 2011-08-17 NOTE — Assessment & Plan Note (Signed)
His BP is well controlled, I will check his lytes and renal function 

## 2011-08-17 NOTE — Assessment & Plan Note (Signed)
Start bactrim-ds for the infection

## 2011-08-17 NOTE — Assessment & Plan Note (Signed)
FLP today 

## 2011-08-17 NOTE — Progress Notes (Signed)
Subjective:    Patient ID: [name], male    DOB: May 16, 1932, 78 y.o.   [name]: [id number]  Urinary Tract Infection  This is a new problem. The current episode started 1 to 4 weeks ago. The problem occurs every urination. The problem has been gradually worsening. The quality of the pain is described as burning. The patient is experiencing no pain. There has been no fever. The fever has been present for less than 1 day. He is not sexually active. There is no history of pyelonephritis. Associated symptoms include hesitancy. Pertinent negatives include no chills, discharge, flank pain, frequency, hematuria, nausea, sweats, urgency or vomiting. He has tried nothing for the symptoms.      Review of Systems  Constitutional: Negative for fever, chills, diaphoresis, activity change, appetite change, fatigue and unexpected weight change.  HENT: Negative.   Eyes: Negative.   Respiratory: Negative for cough, chest tightness, shortness of breath, wheezing and stridor.   Cardiovascular: Negative for chest pain and leg swelling.  Gastrointestinal: Negative for nausea, vomiting, abdominal pain, diarrhea, constipation and blood in stool.  Genitourinary: Positive for hesitancy and difficulty urinating. Negative for dysuria, urgency, frequency, hematuria, flank pain, decreased urine volume, discharge, penile swelling, scrotal swelling, enuresis, genital sores, penile pain and testicular pain.  Musculoskeletal: Negative for myalgias, back pain, joint swelling, arthralgias and gait problem.  Skin: Negative for color change, pallor, rash and wound.  Neurological: Negative.   Hematological: Negative for adenopathy. Does not bruise/bleed easily.  Psychiatric/Behavioral: Negative.        Objective:   Physical Exam  Vitals reviewed. Constitutional: He is oriented to person, place, and time. He appears well-developed and well-nourished.  Non-toxic appearance. He does not have a sickly appearance. He does not  appear ill. No distress.  HENT:  Head: Normocephalic and atraumatic.  Mouth/Throat: Oropharynx is clear and moist. No oropharyngeal exudate.  Eyes: Conjunctivae are normal. Right eye exhibits no discharge. Left eye exhibits no discharge. No scleral icterus.  Neck: Normal range of motion. Neck supple. No JVD present. No tracheal deviation present. No thyromegaly present.  Cardiovascular: Normal rate, S1 normal, S2 normal, normal heart sounds and intact distal pulses.  An irregularly irregular rhythm present. PMI is not displaced.  Exam reveals no gallop, no S3 and no S4.   No murmur heard. Pulmonary/Chest: Effort normal and breath sounds normal. No stridor. No respiratory distress. He has no wheezes. He has no rales. He exhibits no tenderness.  Abdominal: Soft. Bowel sounds are normal. He exhibits no distension and no mass. There is no tenderness. There is no rebound and no guarding. Hernia confirmed negative in the right inguinal area and confirmed negative in the left inguinal area.  Genitourinary: Rectum normal, testes normal and penis normal. Rectal exam shows no external hemorrhoid, no internal hemorrhoid, no fissure, no mass, no tenderness and anal tone normal. Guaiac negative stool. Prostate is enlarged (1+ R lobe > L lobe) and tender (right lobe is boggy). Right testis shows no mass, no swelling and no tenderness. Right testis is descended. Left testis shows no mass, no swelling and no tenderness. Left testis is descended. Uncircumcised. No phimosis, paraphimosis, hypospadias, penile erythema or penile tenderness. No discharge found.  Musculoskeletal: Normal range of motion. He exhibits no edema and no tenderness.  Lymphadenopathy:    He has no cervical adenopathy.       Right: No inguinal adenopathy present.       Left: No inguinal adenopathy present.  Neurological: He is oriented  to person, place, and time.  Skin: Skin is warm and dry. No rash noted. He is not diaphoretic. No erythema. No  pallor.  Psychiatric: He has a normal mood and affect. His behavior is normal. Judgment and thought content normal.     Lab Results  Component Value Date   WBC 9.1 07/29/2010   HGB 14.0 07/29/2010   HCT 42.2 07/29/2010   PLT 186.0 07/29/2010   GLUCOSE 101* 07/29/2010   CHOL 135 04/07/2011   TRIG 93.0 04/07/2011   HDL 57.20 04/07/2011   LDLDIRECT 120.7 07/29/2010   LDLCALC 59 04/07/2011   ALT 44 04/07/2011   AST 55* 04/07/2011   NA 143 07/29/2010   K 4.1 07/29/2010   CL 104 07/29/2010   CREATININE 1.2 07/29/2010   BUN 29* 07/29/2010   CO2 30 07/29/2010   TSH 0.83 07/29/2010   PSA 1.07 12/10/2009   INR 2.6 07/24/2011       Assessment & Plan:

## 2011-08-17 NOTE — Assessment & Plan Note (Signed)
I will check his PSA and his UA today, will treat the infection and start flomax to help his urine flow better

## 2011-08-17 NOTE — Assessment & Plan Note (Addendum)

## 2011-08-18 ENCOUNTER — Other Ambulatory Visit: Payer: Self-pay | Admitting: Cardiology

## 2011-08-18 MED ORDER — WARFARIN SODIUM 5 MG PO TABS: 5.0000 mg | ORAL_TABLET | ORAL | Status: DC

## 2011-08-18 NOTE — Assessment & Plan Note (Signed)
He has good rate control 

## 2011-08-18 NOTE — Assessment & Plan Note (Signed)
I will check his INR today, since he is starting antibiotics that could alter coumadin metabolism I have asked him to RTC in 1-2 weeks for a repeat INR and he agrees

## 2011-08-18 NOTE — Assessment & Plan Note (Signed)
I will check a ur clx today, smx-tmp has been started

## 2011-08-21 ENCOUNTER — Ambulatory Visit (INDEPENDENT_AMBULATORY_CARE_PROVIDER_SITE_OTHER): Payer: Medicare Other | Admitting: *Deleted

## 2011-08-21 ENCOUNTER — Other Ambulatory Visit: Payer: Self-pay | Admitting: Internal Medicine

## 2011-08-21 DIAGNOSIS — N410 Acute prostatitis: Secondary | ICD-10-CM

## 2011-08-21 DIAGNOSIS — Z7901 Long term (current) use of anticoagulants: Secondary | ICD-10-CM

## 2011-08-21 DIAGNOSIS — I4891 Unspecified atrial fibrillation: Secondary | ICD-10-CM

## 2011-08-21 LAB — POCT INR: INR: 2.3

## 2011-08-21 LAB — CULTURE, URINE COMPREHENSIVE

## 2011-08-21 MED ORDER — AMOXICILLIN-POT CLAVULANATE 875-125 MG PO TABS: 1.0000 | ORAL_TABLET | Freq: Two times a day (BID) | ORAL | Status: AC

## 2011-08-25 ENCOUNTER — Telehealth: Payer: Self-pay | Admitting: Cardiology

## 2011-08-25 NOTE — Telephone Encounter (Signed)
New msg [facility] called about drug therapy question for warfarin please call ref number [id number]

## 2011-08-25 NOTE — Telephone Encounter (Signed)
Spoke with Pharmacist at [facility] they wanted to inform us that pt is on Septra. Per last CVRR visit pt informed us that he was stopping Septra. Telephoned pt and he informed me that Septra was stopped and he is taking Augmentin. No interaction with new ABX.

## 2011-09-18 ENCOUNTER — Ambulatory Visit (INDEPENDENT_AMBULATORY_CARE_PROVIDER_SITE_OTHER): Payer: Medicare Other | Admitting: *Deleted

## 2011-09-18 DIAGNOSIS — Z7901 Long term (current) use of anticoagulants: Secondary | ICD-10-CM

## 2011-09-18 DIAGNOSIS — I4891 Unspecified atrial fibrillation: Secondary | ICD-10-CM

## 2011-09-25 ENCOUNTER — Encounter: Payer: Self-pay | Admitting: *Deleted

## 2011-09-25 DIAGNOSIS — Z950 Presence of cardiac pacemaker: Secondary | ICD-10-CM | POA: Insufficient documentation

## 2011-10-05 ENCOUNTER — Ambulatory Visit (INDEPENDENT_AMBULATORY_CARE_PROVIDER_SITE_OTHER): Payer: Medicare Other | Admitting: *Deleted

## 2011-10-05 ENCOUNTER — Encounter: Payer: Self-pay | Admitting: Internal Medicine

## 2011-10-05 DIAGNOSIS — I4891 Unspecified atrial fibrillation: Secondary | ICD-10-CM

## 2011-10-05 LAB — PACEMAKER DEVICE OBSERVATION
BMOD-0004RV: 2
BRDY-0002RV: 60 {beats}/min
DEVICE MODEL PM: 124041
RV LEAD AMPLITUDE: 8.7 mv
RV LEAD IMPEDENCE PM: 550 Ohm
RV LEAD THRESHOLD: 1.9 V

## 2011-10-05 NOTE — Progress Notes (Signed)
Pacer check in clinic  

## 2011-10-14 ENCOUNTER — Encounter: Payer: Self-pay | Admitting: Internal Medicine

## 2011-10-14 ENCOUNTER — Other Ambulatory Visit (INDEPENDENT_AMBULATORY_CARE_PROVIDER_SITE_OTHER): Payer: Medicare Other

## 2011-10-14 ENCOUNTER — Ambulatory Visit (INDEPENDENT_AMBULATORY_CARE_PROVIDER_SITE_OTHER): Payer: Medicare Other | Admitting: Internal Medicine

## 2011-10-14 VITALS — BP 120/78 | HR 60 | Temp 96.0°F | Resp 14 | Wt 183.4 lb

## 2011-10-14 DIAGNOSIS — Z7901 Long term (current) use of anticoagulants: Secondary | ICD-10-CM

## 2011-10-14 DIAGNOSIS — R209 Unspecified disturbances of skin sensation: Secondary | ICD-10-CM

## 2011-10-14 DIAGNOSIS — I4891 Unspecified atrial fibrillation: Secondary | ICD-10-CM

## 2011-10-14 DIAGNOSIS — N410 Acute prostatitis: Secondary | ICD-10-CM

## 2011-10-14 DIAGNOSIS — N390 Urinary tract infection, site not specified: Secondary | ICD-10-CM

## 2011-10-14 DIAGNOSIS — IMO0001 Reserved for inherently not codable concepts without codable children: Secondary | ICD-10-CM | POA: Insufficient documentation

## 2011-10-14 DIAGNOSIS — I10 Essential (primary) hypertension: Secondary | ICD-10-CM

## 2011-10-14 LAB — POCT URINALYSIS DIPSTICK
Bilirubin, UA: NEGATIVE
Glucose, UA: NEGATIVE
Leukocytes, UA: NEGATIVE
Nitrite, UA: NEGATIVE

## 2011-10-14 LAB — PROTIME-INR
INR: 1.5 ratio — ABNORMAL HIGH (ref 0.8–1.0)
Prothrombin Time: 15.8 s — ABNORMAL HIGH (ref 10.2–12.4)

## 2011-10-14 NOTE — Patient Instructions (Signed)
Hematuria, Adult  Hematuria (blood in your urine) can be caused by a bladder infection (cystitis), kidney infection (pyelonephritis), prostate infection (prostatitis), or kidney stone. Infections will usually respond to antibiotics (medications which kill germs), and a kidney stone will usually pass through your urine without further treatment. If you were put on antibiotics, take all the medicine until gone. You may feel better in a few days, but take all of your medicine or the infection may not respond and become more difficult to treat. If antibiotics were not given, an infection did not cause the blood in the urine. A further work up to find out the reason may be needed.  HOME CARE INSTRUCTIONS    Drink lots of fluid, 3 to 4 quarts a day. If you have been diagnosed with an infection, cranberry juice is especially recommended, in addition to large amounts of water.   Avoid caffeine, tea, and carbonated beverages, because they tend to irritate the bladder.   Avoid alcohol as it may irritate the prostate.   Only take over-the-counter or prescription medicines for pain, discomfort, or fever as directed by your caregiver.   If you have been diagnosed with a kidney stone follow your caregivers instructions regarding straining your urine to catch the stone.  TO PREVENT FURTHER INFECTIONS:   Empty the bladder often. Avoid holding urine for long periods of time.   After a bowel movement, women should cleanse front to back. Use each tissue only once.   Empty the bladder before and after sexual intercourse if you are a male.   Return to your caregiver if you develop back pain, fever, nausea (feeling sick to your stomach), vomiting, or your symptoms (problems) are not better in 3 days. Return sooner if you are getting worse.  If you have been requested to return for further testing make sure to keep your appointments. If an infection is not the cause of blood in your urine, X-rays may be required. Your caregiver  will discuss this with you.  SEEK IMMEDIATE MEDICAL CARE IF:    You have a persistent fever over 102 F (38.9 C).   You develop severe vomiting and are unable to keep the medication down.   You develop severe back or abdominal pain despite taking your medications.   You begin passing a large amount of blood or clots in your urine.   You feel extremely weak or faint, or pass out.  MAKE SURE YOU:    Understand these instructions.   Will watch your condition.   Will get help right away if you are not doing well or get worse.  Document Released: 12/29/2004 Document Revised: 03/23/2011 Document Reviewed: 08/18/2007  Exit[employer] Patient Information 2013 [employer].

## 2011-10-14 NOTE — Progress Notes (Signed)
Subjective:    Patient ID: [name], male    DOB: 10/21/1932, 78 y.o.   [name]: [id number]  Hematuria This is a new problem. The current episode started 1 to 4 weeks ago. The problem has been gradually improving since onset. He describes the hematuria as gross hematuria. The hematuria occurs during the initial portion of his urinary stream. He reports no clotting in his urine stream. His pain is at a severity of 0/10. He is experiencing no pain. He describes his urine color as clear. Irritative symptoms do not include frequency, nocturia or urgency. Obstructive symptoms do not include dribbling, incomplete emptying, an intermittent stream, a slower stream, straining or a weak stream. Pertinent negatives include no abdominal pain, chills, dysuria, facial swelling, fever, flank pain, genital pain, hesitancy, inability to urinate, nausea or vomiting. His past medical history is significant for BPH, hypertension, prostatitis and recent infection. There is no history of GU trauma, kidney stones, sickle cell disease, STDs or tobacco use.      Review of Systems  Constitutional: Negative for fever, chills, diaphoresis, activity change, appetite change, fatigue and unexpected weight change.  HENT: Negative.  Negative for facial swelling.   Eyes: Negative.   Respiratory: Negative for cough, chest tightness, shortness of breath, wheezing and stridor.   Cardiovascular: Negative for chest pain, palpitations and leg swelling.  Gastrointestinal: Negative for nausea, vomiting, abdominal pain and rectal pain.  Genitourinary: Positive for hematuria. Negative for dysuria, hesitancy, urgency, frequency, flank pain, decreased urine volume, discharge, penile swelling, scrotal swelling, enuresis, difficulty urinating, genital sores, penile pain, testicular pain, incomplete emptying and nocturia.  Musculoskeletal: Negative.   Neurological: Positive for numbness (burning and tingling in his feet). Negative for  dizziness, tremors, seizures, syncope, facial asymmetry, speech difficulty, weakness, light-headedness and headaches.  Hematological: Negative for adenopathy. Does not bruise/bleed easily.  Psychiatric/Behavioral: Negative.        Objective:   Physical Exam  Vitals reviewed. Constitutional: He is oriented to person, place, and time. He appears well-developed and well-nourished. No distress.  HENT:  Head: Normocephalic and atraumatic.  Mouth/Throat: Oropharynx is clear and moist. No oropharyngeal exudate.  Eyes: Conjunctivae normal are normal. Right eye exhibits no discharge. Left eye exhibits no discharge. No scleral icterus.  Neck: Normal range of motion. Neck supple. No JVD present. No tracheal deviation present. No thyromegaly present.  Cardiovascular: Normal rate, regular rhythm, normal heart sounds and intact distal pulses.  Exam reveals no gallop and no friction rub.   No murmur heard. Pulmonary/Chest: Effort normal and breath sounds normal. No stridor. No respiratory distress. He has no wheezes. He has no rales. He exhibits no tenderness.  Abdominal: Soft. Bowel sounds are normal. He exhibits no distension and no mass. There is no tenderness. There is no rebound and no guarding. Hernia confirmed negative in the right inguinal area.  Genitourinary: Rectum normal, prostate normal, testes normal and penis normal. Rectal exam shows no external hemorrhoid, no internal hemorrhoid, no fissure, no mass and anal tone normal. Guaiac negative stool. Prostate is not enlarged and not tender. Right testis shows no mass, no swelling and no tenderness. Right testis is descended. Left testis shows no mass, no swelling and no tenderness. Left testis is descended. Uncircumcised. No phimosis, paraphimosis, hypospadias, penile erythema or penile tenderness. No discharge found.  Musculoskeletal: Normal range of motion. He exhibits no edema and no tenderness.  Lymphadenopathy:    He has no cervical adenopathy.        Right: No inguinal adenopathy  present.       Left: No inguinal adenopathy present.  Neurological: He is alert and oriented to person, place, and time. He has normal strength. He displays no atrophy, no tremor and normal reflexes. No cranial nerve deficit or sensory deficit. He exhibits normal muscle tone. He displays no seizure activity. Coordination and gait normal. He displays no Babinski's sign on the right side. He displays no Babinski's sign on the left side.  Reflex Scores:      Patellar reflexes are 0 on the right side and 0 on the left side.      Achilles reflexes are 0 on the right side and 0 on the left side. Skin: Skin is warm and dry. No rash noted. He is not diaphoretic. No erythema. No pallor.  Psychiatric: He has a normal mood and affect. His behavior is normal. Judgment and thought content normal.     Lab Results  Component Value Date   WBC 7.4 08/17/2011   HGB 13.5 08/17/2011   HCT 40.1 08/17/2011   PLT 204.0 08/17/2011   GLUCOSE 98 08/17/2011   CHOL 153 08/17/2011   TRIG 88.0 08/17/2011   HDL 55.40 08/17/2011   LDLDIRECT 120.7 07/29/2010   LDLCALC 80 08/17/2011   ALT 26 08/17/2011   AST 33 08/17/2011   NA 139 08/17/2011   K 3.7 08/17/2011   CL 101 08/17/2011   CREATININE 1.2 08/17/2011   BUN 21 08/17/2011   CO2 30 08/17/2011   TSH 1.17 08/17/2011   PSA 0.77 08/17/2011   INR 2.0 09/18/2011   Urine dipstick shows positive for RBC's.  Micro exam: not done.    Assessment & Plan:

## 2011-10-15 MED ORDER — WARFARIN SODIUM 7.5 MG PO TABS: 7.5000 mg | ORAL_TABLET | Freq: Every day | ORAL | Status: DC

## 2011-10-15 NOTE — Assessment & Plan Note (Signed)
His INR is 1.5 so I have asked him to increase his coumadin to 7.5 mg qd

## 2011-10-18 ENCOUNTER — Encounter: Payer: Self-pay | Admitting: Internal Medicine

## 2011-10-18 NOTE — Assessment & Plan Note (Signed)
His BP is well controlled 

## 2011-10-18 NOTE — Assessment & Plan Note (Signed)
It appears the infection has resolved, no more antibiotics for now, will follow for now with no additional measures

## 2011-10-18 NOTE — Assessment & Plan Note (Signed)
I have asked him to get a NCS and EMG done to see what is causing his peripheral nerve symptoms

## 2011-10-30 ENCOUNTER — Ambulatory Visit (INDEPENDENT_AMBULATORY_CARE_PROVIDER_SITE_OTHER): Payer: Medicare Other | Admitting: Pharmacist

## 2011-10-30 DIAGNOSIS — I4891 Unspecified atrial fibrillation: Secondary | ICD-10-CM

## 2011-10-30 DIAGNOSIS — Z7901 Long term (current) use of anticoagulants: Secondary | ICD-10-CM

## 2011-11-05 ENCOUNTER — Encounter: Payer: Self-pay | Admitting: Internal Medicine

## 2011-11-19 ENCOUNTER — Telehealth: Payer: Self-pay | Admitting: Cardiology

## 2011-11-19 ENCOUNTER — Ambulatory Visit: Payer: Self-pay | Admitting: Cardiology

## 2011-11-19 ENCOUNTER — Telehealth: Payer: Self-pay | Admitting: *Deleted

## 2011-11-19 ENCOUNTER — Ambulatory Visit (INDEPENDENT_AMBULATORY_CARE_PROVIDER_SITE_OTHER): Payer: Medicare Other | Admitting: Cardiology

## 2011-11-19 ENCOUNTER — Encounter: Payer: Self-pay | Admitting: Cardiology

## 2011-11-19 VITALS — BP 121/74 | HR 66 | Ht 71.0 in | Wt 181.4 lb

## 2011-11-19 DIAGNOSIS — Z7901 Long term (current) use of anticoagulants: Secondary | ICD-10-CM

## 2011-11-19 DIAGNOSIS — I4891 Unspecified atrial fibrillation: Secondary | ICD-10-CM

## 2011-11-19 DIAGNOSIS — E782 Mixed hyperlipidemia: Secondary | ICD-10-CM

## 2011-11-19 DIAGNOSIS — Z79899 Other long term (current) drug therapy: Secondary | ICD-10-CM

## 2011-11-19 DIAGNOSIS — I2510 Atherosclerotic heart disease of native coronary artery without angina pectoris: Secondary | ICD-10-CM

## 2011-11-19 DIAGNOSIS — I495 Sick sinus syndrome: Secondary | ICD-10-CM

## 2011-11-19 DIAGNOSIS — I10 Essential (primary) hypertension: Secondary | ICD-10-CM

## 2011-11-19 DIAGNOSIS — F17200 Nicotine dependence, unspecified, uncomplicated: Secondary | ICD-10-CM

## 2011-11-19 MED ORDER — RIVAROXABAN 20 MG PO TABS: 20.0000 mg | ORAL_TABLET | Freq: Every day | ORAL | Status: DC

## 2011-11-19 NOTE — Patient Instructions (Addendum)
The current medical regimen is effective;  continue present plan and medications.  A prescription for Xarelto has been sent into your pharmacy.  Once you find out the cost and have decide to take it - call us for instructions on when to stop your coumadin  Follow up in 1 year with Dr Antoine Poche.  You will receive a letter in the mail 2 months before you are due.  Please call us when you receive this letter to schedule your follow up appointment.

## 2011-11-19 NOTE — Telephone Encounter (Signed)
Returned pts call - he will start Xarelto after holding his warfarin for 4 days.  ($17/RX)

## 2011-11-19 NOTE — Telephone Encounter (Signed)
Since he is not taking the Lipitor it is obviously not the cause for the leg discomfort.  He should be on Lipitor 40 mg and check a liver and lipid profile in 8 weeks.

## 2011-11-19 NOTE — Telephone Encounter (Signed)
Pt is calling regarding medication that Dr. Antoine Poche described to him

## 2011-11-19 NOTE — Progress Notes (Signed)
HPI The patient returns for followup of his known coronary disease. Since I last saw him he has had no new cardiovascular complaints. The patient denies any new symptoms such as chest discomfort, neck or arm discomfort. There has been no new shortness of breath, PND or orthopnea. There have been no reported palpitations, presyncope or syncope.  Of note he has had some trouble regulating his warfarin recently. Also he mentions having a workup for possible neuropathy as he describes some tingling or sensation of walking on HDL was. However, an etiology has not been clearly identified. He continues to walk 4-5 times per week without severe limitations.  No Known Allergies  Current Outpatient Prescriptions  Medication Sig Dispense Refill  . allopurinol (ZYLOPRIM) 100 MG tablet Take 1 tablet (100 mg total) by mouth daily.  90 tablet  3  . amLODipine (NORVASC) 10 MG tablet Take 1 tablet (10 mg total) by mouth daily.  90 tablet  2  . atorvastatin (LIPITOR) 40 MG tablet Take 1 tablet (40 mg total) by mouth daily.  90 tablet  3  . doxepin (SINEQUAN) 10 MG capsule TAKE 1 CAPSULE BY MOUTH AT BEDTIME  30 capsule  11  . gabapentin (NEURONTIN) 100 MG capsule Take 100 mg by mouth daily.       . hydrochlorothiazide (HYDRODIURIL) 25 MG tablet Take 1 tablet (25 mg total) by mouth daily.  90 tablet  3  . lisinopril (PRINIVIL,ZESTRIL) 20 MG tablet Take 1 tablet (20 mg total) by mouth daily.  90 tablet  3  . nitroGLYCERIN (NITROSTAT) 0.4 MG SL tablet Place 1 tablet (0.4 mg total) under the tongue every 5 (five) minutes as needed. For chest pain--may repeat three times.  25 tablet  8  . potassium chloride SA (K-DUR,KLOR-CON) 20 MEQ tablet Take 1 tablet (20 mEq total) by mouth daily.  90 tablet  3  . simvastatin (ZOCOR) 40 MG tablet Take 40 mg by mouth every evening.      . Tamsulosin HCl (FLOMAX) 0.4 MG CAPS Take 1 capsule (0.4 mg total) by mouth daily.  90 capsule  3  . traMADol (ULTRAM) 50 MG tablet Take 50 mg by  mouth every 6 (six) hours as needed.      . warfarin (COUMADIN) 7.5 MG tablet Take 7.5 mg by mouth daily. Take 7.5 mg mon, wed and fri, other days take 5 mg        Past Medical History  Diagnosis Date  . ASCVD (arteriosclerotic cardiovascular disease)   . Decreased left ventricular function   . Ventricular hypokinesis     severe/basilar & mid-inferior and inferoseptal segments  . Scarring     cardio/inferior, inferolateral & inferoapical w/o ischemia  . Cerebrovascular disease 01/2004    right carotid bruit, minimal plaque on [place]   . Sick sinus syndrome 08/2004    Sinus Bradycardia & AFib  . Chronic anticoagulation   . History of tobacco abuse     20 pack years; d/c in 1983  . Other and unspecified hyperlipidemia   . Gout, unspecified   . Unspecified essential hypertension   . Vertigo   . Osteoarthritis   . Pacemaker 08/2004    dual-chamber Guidant  . CAD (coronary artery disease)   . HTN (hypertension)     Past Surgical History  Procedure Date  . Transurethral resection of prostate   . Anterior fusion cervical spine 2006    LS [2003]; prior cervical spine procedure w/implantation of hardware[[[  . Cholecystectomy   .  Coronary artery bypass graft 1983    three times  . Appendectomy   . Knee surgery     Left, twice  . Pacemaker placement 08/2004    Guidant implant  . Neck surgery   . Back surgery   . Bladder surgery     ROS:  As stated in the HPI and negative for all other systems.  PHYSICAL EXAM BP 121/74  Pulse 66  Ht 5\' 11"  (1.803 m)  Wt 181 lb 6.4 oz (82.283 kg)  BMI 25.30 kg/m2 GENERAL:  Well appearing HEENT:  Pupils equal round and reactive, fundi not visualized, oral mucosa unremarkable NECK:  No jugular venous distention, waveform within normal limits, carotid upstroke brisk and symmetric, no bruits, no thyromegaly LYMPHATICS:  No cervical, inguinal adenopathy LUNGS:  Clear to auscultation bilaterally BACK:  No CVA tenderness CHEST:  Well healed  sternotomy scar, pacemaker pocket HEART:  PMI not displaced or sustained,S1 and S2 within normal limits, no S3, no S4, no clicks, no rubs, no murmurs ABD:  Flat, positive bowel sounds normal in frequency in pitch, no bruits, no rebound, no guarding, no midline pulsatile mass, no hepatomegaly, no splenomegaly EXT:  2 plus pulses throughout, no edema, no cyanosis no clubbing SKIN:  Psoriatic rash,  no nodules NEURO:  Cranial nerves II through XII grossly intact, motor grossly intact throughout PSYCH:  Cognitively intact, oriented to person place and time  EKG:  Atrial fibrillation, rate 66 with demand ventricular pacing.  Testicular self exam taught.   ASSESSMENT AND PLAN  CAD -  The patient has no new sypmtoms. No further cardiovascular testing is indicated. We will continue with aggressive risk reduction and meds as listed.   HYPERTENSION -  The blood pressure is at target. No change in medications is indicated. We will continue with therapeutic lifestyle changes (TLC).   HYPERLIPIDEMIA -  His last LDL was 80 with an HDL of 40.9.  On the outside chance that the extremity tingling he is describing is related to his statin I will have him hold this for about 6 weeks to see if this symptom improves. He will let me know.   Atrial fibrillation -  We had a long discussion about anticoagulation. He is interested in switching to one of the novel agents. I will start him on Xarelto.

## 2011-11-19 NOTE — Telephone Encounter (Signed)
Pt reports he is not taking Lipitor or Zocor.  Wants to know if he should be or not.  Wants to know what could be causing the tingling sensation he has been having.   He is also not taking Neurontin.  Will make Dr Antoine Poche aware and call back if any changes are needed.

## 2011-11-23 MED ORDER — ATORVASTATIN CALCIUM 40 MG PO TABS: 40.0000 mg | ORAL_TABLET | Freq: Every day | ORAL | Status: DC

## 2011-11-23 NOTE — Telephone Encounter (Signed)
Pt aware to restart Lipitor and to repeat labs in 8 weeks.  He will call back to schedule

## 2011-11-23 NOTE — Telephone Encounter (Signed)
Per wife - pt not in - call back about 3 pm

## 2011-12-24 ENCOUNTER — Other Ambulatory Visit: Payer: Self-pay | Admitting: Cardiology

## 2011-12-31 ENCOUNTER — Other Ambulatory Visit: Payer: Self-pay | Admitting: Pharmacist

## 2012-01-19 ENCOUNTER — Other Ambulatory Visit: Payer: Self-pay | Admitting: Cardiology

## 2012-01-20 ENCOUNTER — Other Ambulatory Visit: Payer: Self-pay | Admitting: *Deleted

## 2012-01-20 MED ORDER — POTASSIUM CHLORIDE CRYS ER 20 MEQ PO TBCR: 20.0000 meq | EXTENDED_RELEASE_TABLET | Freq: Every day | ORAL | Status: DC

## 2012-01-20 NOTE — Telephone Encounter (Signed)
Refilled Potassium

## 2012-02-04 ENCOUNTER — Encounter: Payer: Self-pay | Admitting: Internal Medicine

## 2012-02-04 ENCOUNTER — Telehealth: Payer: Self-pay | Admitting: Internal Medicine

## 2012-02-04 ENCOUNTER — Ambulatory Visit (INDEPENDENT_AMBULATORY_CARE_PROVIDER_SITE_OTHER): Payer: Medicare Other | Admitting: Internal Medicine

## 2012-02-04 VITALS — BP 120/72 | HR 60 | Temp 97.0°F | Resp 16 | Wt 182.0 lb

## 2012-02-04 DIAGNOSIS — N138 Other obstructive and reflux uropathy: Secondary | ICD-10-CM

## 2012-02-04 DIAGNOSIS — N390 Urinary tract infection, site not specified: Secondary | ICD-10-CM

## 2012-02-04 DIAGNOSIS — I495 Sick sinus syndrome: Secondary | ICD-10-CM

## 2012-02-04 DIAGNOSIS — R319 Hematuria, unspecified: Secondary | ICD-10-CM

## 2012-02-04 DIAGNOSIS — E876 Hypokalemia: Secondary | ICD-10-CM

## 2012-02-04 DIAGNOSIS — N410 Acute prostatitis: Secondary | ICD-10-CM

## 2012-02-04 LAB — POCT URINALYSIS DIPSTICK
Bilirubin, UA: NEGATIVE
Glucose, UA: NEGATIVE
Leukocytes, UA: NEGATIVE
Nitrite, UA: NEGATIVE

## 2012-02-04 MED ORDER — CIPROFLOXACIN HCL 500 MG PO TABS: 500.0000 mg | ORAL_TABLET | Freq: Two times a day (BID) | ORAL | Status: DC

## 2012-02-04 MED ORDER — SILODOSIN 8 MG PO CAPS: 8.0000 mg | ORAL_CAPSULE | Freq: Every day | ORAL | Status: DC

## 2012-02-04 NOTE — Telephone Encounter (Signed)
Please resend RX of Cipro 500mg  to [facility]

## 2012-02-04 NOTE — Progress Notes (Signed)
Subjective:    Patient ID: [name], male    DOB: 03/30/1932, 79 y.o.   [name]: [id number]  Benign Prostatic Hypertrophy This is a recurrent problem. The current episode started more than 1 year ago. The problem has been gradually worsening since onset. Irritative symptoms include frequency and nocturia. Irritative symptoms do not include urgency. Obstructive symptoms include dribbling and incomplete emptying. Obstructive symptoms do not include an intermittent stream, a slower stream, straining or a weak stream. Associated symptoms include hematuria and hesitancy. Pertinent negatives include no chills, dysuria, genital pain, nausea or vomiting. AUA score is 8-19. He is sexually active. Nothing aggravates the symptoms. Past treatments include tamsulosin. The treatment provided mild relief. He has been using treatment for 1 to 2 years.      Review of Systems  Constitutional: Negative for fever, chills, diaphoresis, activity change, appetite change, fatigue and unexpected weight change.  HENT: Negative.   Eyes: Negative.   Respiratory: Negative for cough, chest tightness, shortness of breath, wheezing and stridor.   Cardiovascular: Negative for chest pain and leg swelling.  Gastrointestinal: Negative for nausea, vomiting, abdominal pain, diarrhea, constipation, blood in stool, abdominal distention, anal bleeding and rectal pain.  Genitourinary: Positive for hesitancy, frequency, hematuria, incomplete emptying and nocturia. Negative for dysuria, urgency, flank pain, decreased urine volume, discharge, penile swelling, scrotal swelling, enuresis, difficulty urinating, genital sores, penile pain and testicular pain.  Musculoskeletal: Negative for myalgias, back pain, joint swelling, arthralgias and gait problem.  Skin: Negative for color change, pallor, rash and wound.  Neurological: Negative.   Hematological: Negative for adenopathy. Does not bruise/bleed easily.  Psychiatric/Behavioral:  Negative.        Objective:   Physical Exam  Vitals reviewed. Constitutional: He is oriented to person, place, and time. He appears well-developed and well-nourished. No distress.  HENT:  Head: Normocephalic and atraumatic.  Mouth/Throat: Oropharynx is clear and moist. No oropharyngeal exudate.  Eyes: Conjunctivae normal are normal. Right eye exhibits no discharge. Left eye exhibits no discharge. No scleral icterus.  Neck: Normal range of motion. Neck supple. No JVD present. No tracheal deviation present. No thyromegaly present.  Cardiovascular: Normal rate, regular rhythm, normal heart sounds and intact distal pulses.  Exam reveals no gallop and no friction rub.   No murmur heard. Pulmonary/Chest: Effort normal and breath sounds normal. No stridor. No respiratory distress. He has no wheezes. He has no rales. He exhibits no tenderness.  Abdominal: Soft. Bowel sounds are normal. He exhibits no distension and no mass. There is no tenderness. There is no rebound and no guarding. Hernia confirmed negative in the right inguinal area and confirmed negative in the left inguinal area.  Genitourinary: Rectum normal, testes normal and penis normal. Rectal exam shows no external hemorrhoid, no internal hemorrhoid, no fissure, no mass, no tenderness and anal tone normal. Guaiac negative stool. Prostate is enlarged (right lobe is slightly larger than the left lobe and it is boggy). Prostate is not tender. Right testis shows no mass, no swelling and no tenderness. Right testis is descended. Left testis shows no mass, no swelling and no tenderness. Left testis is descended. Uncircumcised. No phimosis, paraphimosis, hypospadias, penile erythema or penile tenderness. No discharge found.  Musculoskeletal: Normal range of motion. He exhibits no edema and no tenderness.  Lymphadenopathy:    He has no cervical adenopathy.       Right: No inguinal adenopathy present.       Left: No inguinal adenopathy present.    Neurological: He is  oriented to person, place, and time.  Skin: Skin is warm and dry. No rash noted. He is not diaphoretic. No erythema. No pallor.  Psychiatric: He has a normal mood and affect. His behavior is normal. Judgment and thought content normal.     Lab Results  Component Value Date   WBC 7.4 08/17/2011   HGB 13.5 08/17/2011   HCT 40.1 08/17/2011   PLT 204.0 08/17/2011   GLUCOSE 98 08/17/2011   CHOL 153 08/17/2011   TRIG 88.0 08/17/2011   HDL 55.40 08/17/2011   LDLDIRECT 120.7 07/29/2010   LDLCALC 80 08/17/2011   ALT 26 08/17/2011   AST 33 08/17/2011   NA 139 08/17/2011   K 3.7 08/17/2011   CL 101 08/17/2011   CREATININE 1.2 08/17/2011   BUN 21 08/17/2011   CO2 30 08/17/2011   TSH 1.17 08/17/2011   PSA 0.77 08/17/2011   INR 3.8 10/30/2011   Urine dipstick shows positive for RBC's.  Micro exam: not done.    Assessment & Plan:

## 2012-02-04 NOTE — Patient Instructions (Signed)
Prostatitis  The prostate gland is about the size and shape of a walnut. It is located just below your bladder. It produces one of the components of semen, which is made up of sperm and the fluids that help nourish and transport it out from the testicles. Prostatitis is redness, soreness, and swelling (inflammation) of the prostate gland.   There are 3 types of prostatitis:   Acute bacterial prostatitis This is the least common type of prostatitis. It starts quickly and usually leads to a bladder infection. It can occur at any age.   Chronic bacterial prostatitis This is a persistent bacterial infection in the prostate.It usually develops from repeated acute bacterial prostatitis or acute bacterial prostatitis that was not properly treated. It can occur in men of any age but is most common in middle-aged men whose prostate has begun to enlarge.   Chronic prostatitis chronic pelvic pain syndrome This is the most common type of prostatitis. It is inflammation of the prostate gland that is not caused by a bacterial infection. The cause is unknown.  CAUSES  The cause of acute and chronic bacterial prostatitis is a bacterial infection. The exact cause of chronic prostatitis and chronic pelvic pain syndrome and asymptomatic inflammatory prostatitis is unknown.   SYMPTOMS   Symptoms can vary depending upon the type of prostatitis that exists. There can also be overlap in symptoms. Possible symptoms for each type of prostatitis are listed below.  Acute bacterial prostatitis   Painful urination.   Fever or chills.   Muscle or joint pains.   Low back pain.   Low abdominal pain.   Inability to empty bladder completely.   Sudden urge to urinate.   Frequent urination.   Difficulty starting urine stream.   Weak urine stream.   Discharge from the urethra.   Dribbling after urination.   Rectal pain.   Pain in the testicles, penis, or tip of the penis.   Pain in the space between the anus and scrotum  (perineum).   Problems with sexual function.   Painful ejaculation.   Bloody semen.  Chronic bacterial prostatitis   The symptoms are similar to those of acute bacterial prostatitis, but they usually are much less severe. Fever, chills, and muscle and joint pain are not associated with chronic bacterial prostatitis.  Chronic prostatitis chronic pelvic pain syndrome   Symptoms typically include a dull ache in the scrotum and the perineum.  DIAGNOSIS   In order to diagnose prostatitis, your caregiver will ask about your symptoms. If acute or chronic bacterial prostatitis is suspected, a urine sample will be taken and tested (urinalysis). This is to see if there is bacteria in your urine. If the urinalysis result is negative for bacteria, your caregiver may use a finger to feel your prostate (digital rectal exam). This exam helps your caregiver determine if your prostate is swollen and tender.  TREATMENT   Treatment for prostatitis depends on the cause. If a bacterial infection is the cause, it can be treated with antibiotic medicine. In cases of chronic bacterial prostatitis, the use of antibiotics for up to 1 month may be necessary. Your caregiver may instruct you to take sitz baths to help relieve pain. A sitz bath is a bath of hot water in which your hips and buttocks are under water.  HOME CARE INSTRUCTIONS    Take all medicines as directed by your caregiver.   Take sitz baths as directed by your caregiver.  SEEK MEDICAL CARE IF:      Your symptoms get worse, not better.   You have a fever.  SEEK IMMEDIATE MEDICAL CARE IF:    You have chills.   You feel nauseous or vomit.   You feel lightheaded or faint.   You are unable to urinate.   You have blood or blood clots in your urine.  Document Released: 12/27/1999 Document Revised: 03/23/2011 Document Reviewed: 12/01/2010  [employer] Patient Information 2013 [employer]

## 2012-02-05 ENCOUNTER — Telehealth: Payer: Self-pay | Admitting: Internal Medicine

## 2012-02-05 ENCOUNTER — Encounter: Payer: Self-pay | Admitting: Internal Medicine

## 2012-02-05 DIAGNOSIS — N138 Other obstructive and reflux uropathy: Secondary | ICD-10-CM

## 2012-02-05 MED ORDER — SILODOSIN 8 MG PO CAPS: 8.0000 mg | ORAL_CAPSULE | Freq: Every day | ORAL | Status: DC

## 2012-02-05 NOTE — Telephone Encounter (Signed)
Pt went to pick up meds at pharmacy today, was able to pick up Cipro, was unable to get other med(not sure of name of med) due to cost, [facility] so [facility] will be able to afford

## 2012-02-05 NOTE — Assessment & Plan Note (Signed)
Will treat with cipro 

## 2012-02-05 NOTE — Assessment & Plan Note (Signed)
He has had intermittent episodes of hematuria so I have asked him to get a CT done to see if there is a mass or stone in his kidneys that is causing this

## 2012-02-05 NOTE — Telephone Encounter (Signed)
Called pt [facility] Rapa[facility]...lmb

## 2012-02-05 NOTE — Assessment & Plan Note (Signed)
I have asked him to change flomax to rapaflo for better symptom relief

## 2012-02-08 ENCOUNTER — Other Ambulatory Visit: Payer: Self-pay | Admitting: Internal Medicine

## 2012-02-08 DIAGNOSIS — I10 Essential (primary) hypertension: Secondary | ICD-10-CM

## 2012-02-09 ENCOUNTER — Other Ambulatory Visit (INDEPENDENT_AMBULATORY_CARE_PROVIDER_SITE_OTHER): Payer: Medicare Other

## 2012-02-09 DIAGNOSIS — I10 Essential (primary) hypertension: Secondary | ICD-10-CM

## 2012-02-09 DIAGNOSIS — E782 Mixed hyperlipidemia: Secondary | ICD-10-CM

## 2012-02-09 DIAGNOSIS — Z79899 Other long term (current) drug therapy: Secondary | ICD-10-CM

## 2012-02-09 LAB — LIPID PANEL
Cholesterol: 149 mg/dL (ref 0–200)
LDL Cholesterol: 68 mg/dL (ref 0–99)
Total CHOL/HDL Ratio: 3
Triglycerides: 136 mg/dL (ref 0.0–149.0)

## 2012-02-09 LAB — HEPATIC FUNCTION PANEL
AST: 30 U/L (ref 0–37)
Albumin: 4.2 g/dL (ref 3.5–5.2)
Alkaline Phosphatase: 62 U/L (ref 39–117)
Bilirubin, Direct: 0.1 mg/dL (ref 0.0–0.3)

## 2012-02-09 LAB — BASIC METABOLIC PANEL
BUN: 26 mg/dL — ABNORMAL HIGH (ref 6–23)
CO2: 33 mEq/L — ABNORMAL HIGH (ref 19–32)
Calcium: 9.8 mg/dL (ref 8.4–10.5)
Creatinine, Ser: 1.2 mg/dL (ref 0.4–1.5)

## 2012-02-10 ENCOUNTER — Encounter: Payer: Self-pay | Admitting: Internal Medicine

## 2012-02-10 ENCOUNTER — Encounter: Payer: Self-pay | Admitting: Cardiology

## 2012-02-10 ENCOUNTER — Telehealth: Payer: Self-pay

## 2012-02-10 ENCOUNTER — Ambulatory Visit (INDEPENDENT_AMBULATORY_CARE_PROVIDER_SITE_OTHER): Payer: Medicare Other | Admitting: Cardiology

## 2012-02-10 ENCOUNTER — Ambulatory Visit (INDEPENDENT_AMBULATORY_CARE_PROVIDER_SITE_OTHER)
Admission: RE | Admit: 2012-02-10 | Discharge: 2012-02-10 | Disposition: A | Discharge status: Home / Self Care | Payer: Medicare Other | Source: Ambulatory Visit | Attending: Internal Medicine | Admitting: Internal Medicine

## 2012-02-10 ENCOUNTER — Other Ambulatory Visit: Payer: Self-pay | Admitting: Internal Medicine

## 2012-02-10 VITALS — BP 120/65 | HR 75 | Ht 71.0 in | Wt 182.0 lb

## 2012-02-10 DIAGNOSIS — E876 Hypokalemia: Secondary | ICD-10-CM | POA: Insufficient documentation

## 2012-02-10 DIAGNOSIS — R319 Hematuria, unspecified: Secondary | ICD-10-CM

## 2012-02-10 DIAGNOSIS — I2510 Atherosclerotic heart disease of native coronary artery without angina pectoris: Secondary | ICD-10-CM

## 2012-02-10 DIAGNOSIS — I4891 Unspecified atrial fibrillation: Secondary | ICD-10-CM

## 2012-02-10 DIAGNOSIS — I495 Sick sinus syndrome: Secondary | ICD-10-CM

## 2012-02-10 DIAGNOSIS — I679 Cerebrovascular disease, unspecified: Secondary | ICD-10-CM

## 2012-02-10 DIAGNOSIS — I10 Essential (primary) hypertension: Secondary | ICD-10-CM

## 2012-02-10 MED ORDER — POTASSIUM CHLORIDE CRYS ER 20 MEQ PO TBCR: 20.0000 meq | EXTENDED_RELEASE_TABLET | Freq: Two times a day (BID) | ORAL | Status: DC

## 2012-02-10 MED ORDER — IOHEXOL 300 MG/ML  SOLN
125.0000 mL | Freq: Once | INTRAMUSCULAR | Status: AC | PRN
  Administered 2012-02-10: 125 mL via INTRAVENOUS

## 2012-02-10 MED ORDER — RIVAROXABAN 20 MG PO TABS: 20.0000 mg | ORAL_TABLET | Freq: Every day | ORAL | Status: DC

## 2012-02-10 NOTE — Patient Instructions (Addendum)
The current medical regimen is effective;  continue present plan and medications.  Follow up in 1 year with Dr Hochrein.  You will receive a letter in the mail 2 months before you are due.  Please call us when you receive this letter to schedule your follow up appointment.  

## 2012-02-10 NOTE — Progress Notes (Signed)
HPI The patient returns for followup of his known coronary disease. Since I last saw him he has had no new cardiovascular complaints. The patient denies any new symptoms such as chest discomfort, neck or arm discomfort. There has been no new shortness of breath, PND or orthopnea. There have been no reported palpitations, presyncope or syncope.  At the last visit I switched him to Xarelto and he is tolerating this. He had some tingling in his feet before. However, it is felt that this is likely orthopedic.  We thought it might be the simvastatin but there doesn't seem to be a connection. Of note he's here today for preoperative clearance as he is going to have his hands operated on. He has a very high functional level.  He walks 3 miles on a treadmill without any limitations. He has had no new symptoms since his last stress test about 4 years ago.  No Known Allergies  Current Outpatient Prescriptions  Medication Sig Dispense Refill  . allopurinol (ZYLOPRIM) 100 MG tablet Take 1 tablet (100 mg total) by mouth daily.  90 tablet  3  . amLODipine (NORVASC) 10 MG tablet Take 1 tablet (10 mg total) by mouth daily.  90 tablet  2  . atorvastatin (LIPITOR) 40 MG tablet Take 1 tablet (40 mg total) by mouth daily.  90 tablet  3  . ciprofloxacin (CIPRO) 500 MG tablet Take 1 tablet (500 mg total) by mouth 2 (two) times daily.  60 tablet  1  . colchicine 0.6 MG tablet Take 0.6 mg by mouth 2 (two) times daily.      Marland Kitchen doxepin (SINEQUAN) 10 MG capsule TAKE 1 CAPSULE BY MOUTH AT BEDTIME  30 capsule  11  . hydrochlorothiazide (HYDRODIURIL) 25 MG tablet Take 1 tablet (25 mg total) by mouth daily.  90 tablet  3  . lisinopril (PRINIVIL,ZESTRIL) 20 MG tablet Take 1 tablet (20 mg total) by mouth daily.  90 tablet  3  . nitroGLYCERIN (NITROSTAT) 0.4 MG SL tablet Place 1 tablet (0.4 mg total) under the tongue every 5 (five) minutes as needed. For chest pain--may repeat three times.  25 tablet  8  . potassium chloride SA  (K-DUR,KLOR-CON) 20 MEQ tablet Take 1 tablet (20 mEq total) by mouth 2 (two) times daily.  180 tablet  1  . Rivaroxaban (XARELTO) 20 MG TABS Take 1 tablet (20 mg total) by mouth daily.  30 tablet  11  . simvastatin (ZOCOR) 40 MG tablet Take 20 mg by mouth every evening.         Past Medical History  Diagnosis Date  . ASCVD (arteriosclerotic cardiovascular disease)   . Decreased left ventricular function   . Ventricular hypokinesis     severe/basilar & mid-inferior and inferoseptal segments  . Scarring     cardio/inferior, inferolateral & inferoapical w/o ischemia  . Cerebrovascular disease 01/2004    right carotid bruit, minimal plaque on [place]   . Sick sinus syndrome 08/2004    Sinus Bradycardia & AFib  . Chronic anticoagulation   . History of tobacco abuse     20 pack years; d/c in 1983  . Other and unspecified hyperlipidemia   . Gout, unspecified   . Unspecified essential hypertension   . Vertigo   . Osteoarthritis   . Pacemaker 08/2004    dual-chamber Guidant  . CAD (coronary artery disease)   . HTN (hypertension)     Past Surgical History  Procedure Date  . Transurethral resection of prostate   .  Anterior fusion cervical spine 2006    LS [2003]; prior cervical spine procedure w/implantation of hardware[[[  . Cholecystectomy   . Coronary artery bypass graft 1983    three times  . Appendectomy   . Knee surgery     Left, twice  . Pacemaker placement 08/2004    Guidant implant  . Neck surgery   . Back surgery   . Bladder surgery     ROS:  As stated in the HPI and negative for all other systems.  PHYSICAL EXAM BP 120/65  Pulse 75  Ht 5\' 11"  (1.803 m)  Wt 182 lb (82.555 kg)  BMI 25.38 kg/m2 GENERAL:  Well appearing NECK:  No jugular venous distention, waveform within normal limits, carotid upstroke brisk and symmetric, no bruits, no thyromegaly LYMPHATICS:  No cervical, inguinal adenopathy LUNGS:  Clear to auscultation bilaterally CHEST:  Well healed  sternotomy scar, pacemaker pocket HEART:  PMI not displaced or sustained,S1 and S2 within normal limits, no S3, no S4, no clicks, no rubs, no murmurs ABD:  Flat, positive bowel sounds normal in frequency in pitch, no bruits, no rebound, no guarding, no midline pulsatile mass, no hepatomegaly, no splenomegaly EXT:  2 plus pulses throughout, no edema, no cyanosis no clubbing SKIN:  Psoriatic rash,  no nodules  EKG:  Atrial fibrillation, rate 66 with demand ventricular pacing.  Testicular self exam taught.   ASSESSMENT AND PLAN  PRE OP - He is going for a low risk procedure. He has a very high functional level. He has no symptoms since his last test. I saw herTherefore, based on ACC/AHA guidelines, the patient would be at acceptable risk for the planned procedure without further cardiovascular testing.  CAD -  The patient has no new sypmtoms. No further cardiovascular testing is indicated. We will continue with aggressive risk reduction and meds as listed.   HYPERTENSION -  The blood pressure is at target. No change in medications is indicated. We will continue with therapeutic lifestyle changes (TLC).   HYPERLIPIDEMIA -  He will continue on the meds as listed.   Atrial fibrillation -  The patient is tolerating his Xarelto.  He can hold this as needed for his upcoming surgery.  He should stop this 2 days before the surgery.

## 2012-02-10 NOTE — Telephone Encounter (Signed)
Patient stopped by filled out a walk in slip to update medication list. Epic list medication

## 2012-02-10 NOTE — Addendum Note (Signed)
Addended by: Etta Grandchild on: 02/10/2012 09:17 AM   Modules accepted: Orders

## 2012-02-12 ENCOUNTER — Encounter: Payer: Self-pay | Admitting: *Deleted

## 2012-02-16 ENCOUNTER — Other Ambulatory Visit: Payer: Self-pay | Admitting: *Deleted

## 2012-02-16 DIAGNOSIS — E782 Mixed hyperlipidemia: Secondary | ICD-10-CM

## 2012-02-16 MED ORDER — ATORVASTATIN CALCIUM 40 MG PO TABS: 40.0000 mg | ORAL_TABLET | Freq: Every day | ORAL | Status: DC

## 2012-03-16 ENCOUNTER — Other Ambulatory Visit (INDEPENDENT_AMBULATORY_CARE_PROVIDER_SITE_OTHER): Payer: Medicare Other

## 2012-03-16 ENCOUNTER — Ambulatory Visit (INDEPENDENT_AMBULATORY_CARE_PROVIDER_SITE_OTHER): Payer: Medicare Other | Admitting: Internal Medicine

## 2012-03-16 ENCOUNTER — Encounter: Payer: Self-pay | Admitting: Internal Medicine

## 2012-03-16 VITALS — BP 120/80 | HR 60 | Temp 97.5°F | Resp 16 | Wt 182.0 lb

## 2012-03-16 DIAGNOSIS — M5030 Other cervical disc degeneration, unspecified cervical region: Secondary | ICD-10-CM

## 2012-03-16 DIAGNOSIS — I2510 Atherosclerotic heart disease of native coronary artery without angina pectoris: Secondary | ICD-10-CM

## 2012-03-16 DIAGNOSIS — I10 Essential (primary) hypertension: Secondary | ICD-10-CM

## 2012-03-16 DIAGNOSIS — N410 Acute prostatitis: Secondary | ICD-10-CM

## 2012-03-16 DIAGNOSIS — I4891 Unspecified atrial fibrillation: Secondary | ICD-10-CM

## 2012-03-16 DIAGNOSIS — M109 Gout, unspecified: Secondary | ICD-10-CM

## 2012-03-16 DIAGNOSIS — E876 Hypokalemia: Secondary | ICD-10-CM

## 2012-03-16 DIAGNOSIS — M1990 Unspecified osteoarthritis, unspecified site: Secondary | ICD-10-CM

## 2012-03-16 DIAGNOSIS — N138 Other obstructive and reflux uropathy: Secondary | ICD-10-CM

## 2012-03-16 LAB — MAGNESIUM: Magnesium: 1.7 mg/dL (ref 1.5–2.5)

## 2012-03-16 LAB — BASIC METABOLIC PANEL
GFR: 62.91 mL/min (ref >60.00)
Glucose, Bld: 104 mg/dL — ABNORMAL HIGH (ref 70–99)
Potassium: 3.6 mEq/L (ref 3.5–5.1)
Sodium: 142 mEq/L (ref 135–145)

## 2012-03-16 MED ORDER — SILODOSIN 8 MG PO CAPS: 8.0000 mg | ORAL_CAPSULE | Freq: Every day | ORAL | Status: DC

## 2012-03-16 NOTE — Patient Instructions (Addendum)

## 2012-03-16 NOTE — Progress Notes (Signed)
Subjective:    Patient ID: [name], male    DOB: 12-05-1932, 79 y.o.   [name]: [id number]  Sore Throat  This is a new problem. The current episode started in the past 7 days. The problem has been unchanged. Neither side of throat is experiencing more pain than the other. There has been no fever. The fever has been present for less than 1 day. The pain is at a severity of 0/10. The patient is experiencing no pain. Associated symptoms include congestion and headaches. Pertinent negatives include no abdominal pain, coughing, diarrhea, drooling, ear discharge, ear pain, hoarse voice, plugged ear sensation, neck pain, shortness of breath, stridor, swollen glands, trouble swallowing or vomiting. He has tried nothing for the symptoms.  Benign Prostatic Hypertrophy This is a recurrent problem. The current episode started more than 1 month ago. The problem has been gradually worsening since onset. Irritative symptoms include nocturia. Irritative symptoms do not include frequency or urgency. Obstructive symptoms include dribbling, incomplete emptying, an intermittent stream, a slower stream and a weak stream. Obstructive symptoms do not include straining. Pertinent negatives include no chills, dysuria, genital pain, hematuria, hesitancy, nausea or vomiting. AUA score is 0-7. He is sexually active. Past treatments include nothing.      Review of Systems  Constitutional: Negative for fever, chills, diaphoresis, activity change, appetite change, fatigue and unexpected weight change.  HENT: Positive for congestion, sore throat and voice change (mild laryngitis). Negative for ear pain, nosebleeds, hoarse voice, facial swelling, rhinorrhea, sneezing, drooling, mouth sores, trouble swallowing, neck pain, dental problem, postnasal drip, sinus pressure and ear discharge.   Eyes: Negative.   Respiratory: Negative for cough, shortness of breath and stridor.   Cardiovascular: Negative for chest pain, palpitations  and leg swelling.  Gastrointestinal: Negative for nausea, vomiting, abdominal pain and diarrhea.  Endocrine: Negative.   Genitourinary: Positive for difficulty urinating, incomplete emptying and nocturia. Negative for dysuria, hesitancy, urgency, frequency, hematuria, flank pain, decreased urine volume, discharge, penile swelling, scrotal swelling, enuresis, genital sores, penile pain and testicular pain.  Musculoskeletal: Negative.   Skin: Negative.   Allergic/Immunologic: Negative.   Neurological: Positive for headaches. Negative for dizziness, tremors, seizures, syncope, facial asymmetry, speech difficulty, weakness, light-headedness and numbness.  Hematological: Negative for adenopathy. Does not bruise/bleed easily.  Psychiatric/Behavioral: Negative.        Objective:   Physical Exam  Vitals reviewed. Constitutional: He is oriented to person, place, and time. Vital signs are normal. He appears well-developed and well-nourished.  Non-toxic appearance. He does not have a sickly appearance. He does not appear ill. No distress.  HENT:  Head: Normocephalic. No trismus in the jaw.  Right Ear: Hearing, tympanic membrane, external ear and ear canal normal.  Left Ear: Hearing, tympanic membrane, external ear and ear canal normal.  Nose: Nose normal.  Mouth/Throat: Mucous membranes are normal. Mucous membranes are not pale, not dry and not cyanotic. No oral lesions. No edematous. Posterior oropharyngeal erythema present. No oropharyngeal exudate, posterior oropharyngeal edema or tonsillar abscesses.  Eyes: Conjunctivae are normal. Right eye exhibits no discharge. Left eye exhibits no discharge. No scleral icterus.  Neck: Normal range of motion. Neck supple. No JVD present. No tracheal deviation present. No thyromegaly present.  Cardiovascular: Normal rate, S1 normal, S2 normal, normal heart sounds and intact distal pulses.  An irregular rhythm present. Exam reveals no gallop.   No murmur  heard. Pulmonary/Chest: Effort normal and breath sounds normal. No stridor. No respiratory distress. He has no wheezes. He has  no rales. He exhibits no tenderness.  Abdominal: Soft. Bowel sounds are normal. He exhibits no distension and no mass. There is no tenderness. There is no rebound and no guarding.  Musculoskeletal: Normal range of motion. He exhibits no edema and no tenderness.  Lymphadenopathy:    He has no cervical adenopathy.  Neurological: He is oriented to person, place, and time.  Skin: Skin is warm and dry. No rash noted. He is not diaphoretic. No erythema. No pallor.  Psychiatric: He has a normal mood and affect. His behavior is normal. Judgment and thought content normal.     Lab Results  Component Value Date   WBC 7.4 08/17/2011   HGB 13.5 08/17/2011   HCT 40.1 08/17/2011   PLT 204.0 08/17/2011   GLUCOSE 94 02/09/2012   CHOL 149 02/09/2012   TRIG 136.0 02/09/2012   HDL 53.40 02/09/2012   LDLDIRECT 120.7 07/29/2010   LDLCALC 68 02/09/2012   ALT 26 02/09/2012   AST 30 02/09/2012   NA 140 02/09/2012   K 3.3* 02/09/2012   CL 99 02/09/2012   CREATININE 1.2 02/09/2012   BUN 26* 02/09/2012   CO2 33* 02/09/2012   TSH 1.17 08/17/2011   PSA 0.77 08/17/2011   INR 3.8 10/30/2011       Assessment & Plan:

## 2012-03-18 ENCOUNTER — Encounter: Payer: Self-pay | Admitting: Internal Medicine

## 2012-03-18 NOTE — Assessment & Plan Note (Signed)
Start rapaflo

## 2012-03-18 NOTE — Assessment & Plan Note (Signed)
I will recheck his K+ level today 

## 2012-03-18 NOTE — Assessment & Plan Note (Signed)
His BP is well controlled 

## 2012-03-18 NOTE — Assessment & Plan Note (Signed)
This is improving with cipro

## 2012-03-18 NOTE — Assessment & Plan Note (Signed)
He has good rate control today 

## 2012-03-23 ENCOUNTER — Telehealth: Payer: Self-pay | Admitting: Internal Medicine

## 2012-03-23 ENCOUNTER — Encounter: Payer: Self-pay | Admitting: Internal Medicine

## 2012-03-23 ENCOUNTER — Ambulatory Visit (INDEPENDENT_AMBULATORY_CARE_PROVIDER_SITE_OTHER): Payer: Medicare Other | Admitting: Internal Medicine

## 2012-03-23 VITALS — BP 138/80 | HR 62 | Temp 97.1°F | Ht 71.0 in | Wt 185.0 lb

## 2012-03-23 DIAGNOSIS — J209 Acute bronchitis, unspecified: Secondary | ICD-10-CM

## 2012-03-23 DIAGNOSIS — R05 Cough: Secondary | ICD-10-CM

## 2012-03-23 MED ORDER — AZITHROMYCIN 250 MG PO TABS: ORAL_TABLET | ORAL | Status: DC

## 2012-03-23 MED ORDER — HYDROCODONE-HOMATROPINE 5-1.5 MG/5ML PO SYRP: 5.0000 mL | ORAL_SOLUTION | Freq: Three times a day (TID) | ORAL | Status: DC | PRN

## 2012-03-23 NOTE — Telephone Encounter (Signed)
The pharmacy called the triage line in hopes of getting clarification on the patient's medication.  The pharmacy's callback number is [phone]

## 2012-03-23 NOTE — Patient Instructions (Signed)

## 2012-03-23 NOTE — Progress Notes (Signed)
HPI  Pt presents to the clinic today with c/o cold symptoms x 4 days. The worst part is the sore throat and dry cough. [name] does not produce any sputum. [name] denies fevers, chill or body aches. [name] has tried Robitussin, Mucinex, cough drops and nothing seems to help. The cough is worse at night. [name] has not had much sleep in 3 nights. [name] does not  have a history of allergies or asthma. [name] does have sick contacts.  Review of Systems      Past Medical History  Diagnosis Date  . ASCVD (arteriosclerotic cardiovascular disease)   . Decreased left ventricular function   . Ventricular hypokinesis     severe/basilar & mid-inferior and inferoseptal segments  . Scarring     cardio/inferior, inferolateral & inferoapical w/o ischemia  . Cerebrovascular disease 01/2004    right carotid bruit, minimal plaque on [place]   . Sick sinus syndrome 08/2004    Sinus Bradycardia & AFib  . Chronic anticoagulation   . History of tobacco abuse     20 pack years; d/c in 1983  . Other and unspecified hyperlipidemia   . Gout, unspecified   . Unspecified essential hypertension   . Vertigo   . Osteoarthritis   . Pacemaker 08/2004    dual-chamber Guidant  . CAD (coronary artery disease)   . HTN (hypertension)     Family History  Problem Relation Age of Onset  . Cancer Neg Hx   . Diabetes Neg Hx   . Early death Neg Hx   . Heart disease Neg Hx   . Hyperlipidemia Neg Hx   . Hypertension Neg Hx   . Kidney disease Neg Hx   . Stroke Neg Hx     History   Social History  . Marital Status: Divorced    Spouse Name: N/A    Number of Children: N/A  . Years of Education: N/A   Occupational History  . Retired     [employer] Department   Social History Main Topics  . Smoking status: Former Smoker    Quit date: 11/18/1976  . Smokeless tobacco: Never Used  . Alcohol Use: No  . Drug Use: No  . Sexually Active: Yes   Other Topics Concern  . Not on file   Social History Narrative   Retired [employer]  Department   Divorced-Remarried   Regular Exercise-yes             No Known Allergies   Constitutional: Positive headache. Denies fatigue, fever or abrupt weight changes.  HEENT:  Positive sore throat. Denies eye redness, eye pain, pressure behind the eyes, facial pain, nasal congestion, ear pain, ringing in the ears, wax buildup, runny nose or bloody nose. Respiratory: Positive cough. Denies difficulty breathing or shortness of breath.  Cardiovascular: Denies chest pain, chest tightness, palpitations or swelling in the hands or feet.   No other specific complaints in a complete review of systems (except as listed in HPI above).  Objective:   BP 138/80  Pulse 62  Temp(Src) 97.1 F (36.2 C) (Oral)  Ht 5\' 11"  (1.803 m)  Wt 185 lb (83.915 kg)  BMI 25.81 kg/m2  SpO2 97% Wt Readings from Last 3 Encounters:  03/23/12 185 lb (83.915 kg)  03/16/12 182 lb (82.555 kg)  02/10/12 182 lb (82.555 kg)     General: Appears his stated age, well developed, well nourished in NAD. HEENT: Head: normal shape and size; Eyes: sclera white, no icterus, conjunctiva pink, PERRLA and EOMs  intact; Ears: Tm's gray and intact, normal light reflex; Nose: mucosa pink and moist, septum midline; Throat/Mouth: + PND. Teeth present, mucosa erythematous and moist, no exudate noted, no lesions or ulcerations noted.  Neck: Mild cervical lymphadenopathy. Neck supple, trachea midline. No massses, lumps or thyromegaly present.  Cardiovascular: Normal rate and rhythm. S1,S2 noted.  No murmur, rubs or gallops noted. No JVD or BLE edema. No carotid bruits noted. Pulmonary/Chest: Normal effort and scattered rhonchi and expiratory wheezes in the RUL and LUL. No respiratory distress.      Assessment & Plan:   Acute Bronchitis, new onset:  Get some rest and drink plenty of water Do salt water gargles for the sore throat eRx for Azithromax x 5 days eRx for Hycodan cough syrup  RTC as needed or if symptoms  persist.

## 2012-03-24 NOTE — Telephone Encounter (Signed)
Returned call to pharmacy, was told person that called is not available.

## 2012-04-01 ENCOUNTER — Other Ambulatory Visit: Payer: Self-pay | Admitting: Cardiology

## 2012-05-10 ENCOUNTER — Encounter: Payer: Self-pay | Admitting: Internal Medicine

## 2012-05-10 ENCOUNTER — Ambulatory Visit (INDEPENDENT_AMBULATORY_CARE_PROVIDER_SITE_OTHER): Payer: Medicare Other | Admitting: Internal Medicine

## 2012-05-10 VITALS — BP 100/60 | HR 60 | Ht 70.0 in | Wt 175.0 lb

## 2012-05-10 DIAGNOSIS — Z950 Presence of cardiac pacemaker: Secondary | ICD-10-CM

## 2012-05-10 DIAGNOSIS — R0609 Other forms of dyspnea: Secondary | ICD-10-CM

## 2012-05-10 DIAGNOSIS — I4891 Unspecified atrial fibrillation: Secondary | ICD-10-CM

## 2012-05-10 DIAGNOSIS — R0600 Dyspnea, unspecified: Secondary | ICD-10-CM

## 2012-05-10 DIAGNOSIS — R0602 Shortness of breath: Secondary | ICD-10-CM

## 2012-05-10 LAB — PACEMAKER DEVICE OBSERVATION
BMOD-0002RV: 8
BRDY-0004RV: 120 {beats}/min
DEVICE MODEL PM: 124041

## 2012-05-10 MED ORDER — LISINOPRIL 20 MG PO TABS: 10.0000 mg | ORAL_TABLET | Freq: Every day | ORAL | Status: DC

## 2012-05-10 MED ORDER — HYDROCHLOROTHIAZIDE 25 MG PO TABS: 12.5000 mg | ORAL_TABLET | Freq: Every day | ORAL | Status: DC

## 2012-05-10 NOTE — Assessment & Plan Note (Signed)
His [employer] pacemaker is working normally. He has approximately one and 1/2 years of battery longevity. We'll plan to recheck in several months

## 2012-05-10 NOTE — Assessment & Plan Note (Signed)
In the past month, the patient has developed increasing dyspnea and fatigue. No anginal symptoms otherwise. Specifically no chest pain. He has a history of mild left ventricular dysfunction in the setting of coronary disease. I recommended a 2-D echo to better understand the possible etiology of his worsening dyspnea and fatigue. He is scheduled to see his medical doctor for additional evaluation as well.

## 2012-05-10 NOTE — Progress Notes (Signed)
HPI [name] returns today for followup. He is a very pleasant 79 year old man with a history of chronic atrial fibrillation, symptomatic bradycardia, status post permanent pacemaker insertion. He also has dyslipidemia and hypertension. Over the last month, the patient notes increasing fatigue, weakness, and shortness of breath. He denies weight loss. No syncope. Minimal or no palpitations. No Known Allergies   Current Outpatient Prescriptions  Medication Sig Dispense Refill  . allopurinol (ZYLOPRIM) 100 MG tablet Take 1 tablet (100 mg total) by mouth daily.  90 tablet  3  . atorvastatin (LIPITOR) 40 MG tablet Take 1 tablet (40 mg total) by mouth daily.  90 tablet  3  . azithromycin (ZITHROMAX) 250 MG tablet Take 2 tablets today, then 1 tablet daily for 4 days  6 tablet  0  . colchicine 0.6 MG tablet Take 0.6 mg by mouth 2 (two) times daily.      Marland Kitchen doxepin (SINEQUAN) 10 MG capsule TAKE 1 CAPSULE BY MOUTH AT BEDTIME  30 capsule  11  . hydrochlorothiazide (HYDRODIURIL) 25 MG tablet Take 1 tablet (25 mg total) by mouth daily.  90 tablet  3  . HYDROcodone-homatropine (HYCODAN) 5-1.5 MG/5ML syrup Take 5 mLs by mouth every 8 (eight) hours as needed for cough.  120 mL  0  . lisinopril (PRINIVIL,ZESTRIL) 20 MG tablet Take 1 tablet (20 mg total) by mouth daily.  90 tablet  3  . nitroGLYCERIN (NITROSTAT) 0.4 MG SL tablet Place 1 tablet (0.4 mg total) under the tongue every 5 (five) minutes as needed. For chest pain--may repeat three times.  25 tablet  8  . potassium chloride SA (K-DUR,KLOR-CON) 20 MEQ tablet Take 1 tablet (20 mEq total) by mouth 2 (two) times daily.  180 tablet  1  . Rivaroxaban (XARELTO) 20 MG TABS Take 1 tablet (20 mg total) by mouth daily.  90 tablet  3  . silodosin (RAPAFLO) 8 MG CAPS capsule Take 1 capsule (8 mg total) by mouth daily with breakfast.  90 capsule  3  . simvastatin (ZOCOR) 40 MG tablet Take 20 mg by mouth every evening.        No current facility-administered  medications for this visit.     Past Medical History  Diagnosis Date  . ASCVD (arteriosclerotic cardiovascular disease)   . Decreased left ventricular function   . Ventricular hypokinesis     severe/basilar & mid-inferior and inferoseptal segments  . Scarring     cardio/inferior, inferolateral & inferoapical w/o ischemia  . Cerebrovascular disease 01/2004    right carotid bruit, minimal plaque on [place]   . Sick sinus syndrome 08/2004    Sinus Bradycardia & AFib  . Chronic anticoagulation   . History of tobacco abuse     20 pack years; d/c in 1983  . Other and unspecified hyperlipidemia   . Gout, unspecified   . Unspecified essential hypertension   . Vertigo   . Osteoarthritis   . Pacemaker 08/2004    dual-chamber Guidant  . CAD (coronary artery disease)   . HTN (hypertension)     ROS:   All systems reviewed and negative except as noted in the HPI.   Past Surgical History  Procedure Laterality Date  . Transurethral resection of prostate    . Anterior fusion cervical spine  2006    LS [2003]; prior cervical spine procedure w/implantation of hardware[[[  . Cholecystectomy    . Coronary artery bypass graft  1983    three times  . Appendectomy    .  Knee surgery      Left, twice  . Pacemaker placement  08/2004    Guidant implant  . Neck surgery    . Back surgery    . Bladder surgery       Family History  Problem Relation Age of Onset  . Cancer Neg Hx   . Diabetes Neg Hx   . Early death Neg Hx   . Heart disease Neg Hx   . Hyperlipidemia Neg Hx   . Hypertension Neg Hx   . Kidney disease Neg Hx   . Stroke Neg Hx      History   Social History  . Marital Status: Divorced    Spouse Name: N/A    Number of Children: N/A  . Years of Education: N/A   Occupational History  . Retired     [employer] Department   Social History Main Topics  . Smoking status: Former Smoker    Quit date: 11/18/1976  . Smokeless tobacco: Never Used  . Alcohol Use: No  . Drug  Use: No  . Sexually Active: Yes   Other Topics Concern  . Not on file   Social History Narrative   Retired [employer] Department   Divorced-Remarried   Regular Exercise-yes              BP 100/60  Pulse 60  Ht 5\' 10"  (1.778 m)  Wt 175 lb (79.379 kg)  BMI 25.11 kg/m2  Physical Exam:  Well appearing NAD HEENT: Unremarkable Neck:  6 cm JVD, no thyromegally Back:  No CVA tenderness Lungs:  Clear with no wheezes, rales, or rhonchi. HEART:  Regular rate rhythm, grade 2/6 systolic murmurs, no rubs, no clicks Abd:  soft, positive bowel sounds, no organomegally, no rebound, no guarding Ext:  2 plus pulses, no edema, no cyanosis, no clubbing Skin:  No rashes no nodules Neuro:  CN II through XII intact, motor grossly intact  DEVICE  Normal device function.  See PaceArt for details.   Assess/Plan:

## 2012-05-10 NOTE — Assessment & Plan Note (Signed)
His ventricular rate appears to be well-controlled. He will continue anticoagulation.

## 2012-05-10 NOTE — Patient Instructions (Addendum)
Your physician wants you to follow-up in: 6 months with device clinic and 12 months with Dr Court Joy will receive a reminder letter in the mail two months in advance. If you don't receive a letter, please call our office to schedule the follow-up appointment.   Your physician has requested that you have an echocardiogram. Echocardiography is a painless test that uses sound waves to create images of your heart. It provides your doctor with information about the size and shape of your heart and how well your heart's chambers and valves are working. This procedure takes approximately one hour. There are no restrictions for this procedure.   Your physician has recommended you make the following change in your medication:  1) Stop Amlodipine 2) Decrease HCTZ to 12.5mg  daily 1/2 tablet 3) Decrease Lisinopril to 10mg  daily 1/2 tablet daily

## 2012-05-11 ENCOUNTER — Encounter: Payer: Self-pay | Admitting: Internal Medicine

## 2012-05-11 ENCOUNTER — Ambulatory Visit (INDEPENDENT_AMBULATORY_CARE_PROVIDER_SITE_OTHER): Payer: Medicare Other | Admitting: Internal Medicine

## 2012-05-11 VITALS — BP 110/82 | HR 80 | Temp 98.1°F | Resp 16 | Wt 183.0 lb

## 2012-05-11 DIAGNOSIS — I10 Essential (primary) hypertension: Secondary | ICD-10-CM

## 2012-05-11 NOTE — Assessment & Plan Note (Signed)
He has had some symptomatic hypotension so meds were adjusted yesterday by Dr. [name], he feels better today No further changes were made by me today

## 2012-05-11 NOTE — Progress Notes (Signed)
Subjective:    Patient ID: [name], male    DOB: Jun 27, 1932, 79 y.o.   [name]: [id number]  Hypertension This is a chronic problem. The problem has been rapidly improving since onset. Associated symptoms include malaise/fatigue. Pertinent negatives include no anxiety, blurred vision, chest pain, headaches, neck pain, orthopnea, palpitations, peripheral edema, PND, shortness of breath or sweats. There are no associated agents to hypertension. Past treatments include calcium channel blockers, ACE inhibitors and diuretics. The current treatment provides significant improvement. There are no compliance problems.       Review of Systems  Constitutional: Positive for malaise/fatigue. Negative for fever, chills, diaphoresis, activity change, appetite change, fatigue and unexpected weight change.  HENT: Negative.  Negative for neck pain.   Eyes: Negative.  Negative for blurred vision.  Respiratory: Negative.  Negative for cough, chest tightness, shortness of breath, wheezing and stridor.   Cardiovascular: Negative.  Negative for chest pain, palpitations, orthopnea, leg swelling and PND.  Gastrointestinal: Negative.  Negative for nausea, vomiting, abdominal pain, diarrhea and constipation.  Endocrine: Negative.   Genitourinary: Negative.   Musculoskeletal: Negative.  Negative for myalgias, back pain, joint swelling, arthralgias and gait problem.  Skin: Negative.  Negative for color change, pallor, rash and wound.  Allergic/Immunologic: Negative.   Neurological: Positive for dizziness and light-headedness (associated with BP of 100/60). Negative for tremors, seizures, syncope, facial asymmetry, speech difficulty, weakness, numbness and headaches.  Hematological: Negative.  Negative for adenopathy. Does not bruise/bleed easily.  Psychiatric/Behavioral: Negative.        Objective:   Physical Exam  Vitals reviewed. Constitutional: He is oriented to person, place, and time. He appears  well-developed and well-nourished. No distress.  HENT:  Head: Normocephalic and atraumatic.  Mouth/Throat: Oropharynx is clear and moist. No oropharyngeal exudate.  Eyes: Conjunctivae are normal. Right eye exhibits no discharge. Left eye exhibits no discharge. No scleral icterus.  Neck: Normal range of motion. Neck supple. No JVD present. No tracheal deviation present. No thyromegaly present.  Cardiovascular: Normal rate, regular rhythm, normal heart sounds and intact distal pulses.  Exam reveals no gallop and no friction rub.   No murmur heard. Pulmonary/Chest: Effort normal and breath sounds normal. No stridor. No respiratory distress. He has no wheezes. He has no rales. He exhibits no tenderness.  Abdominal: Soft. Bowel sounds are normal. He exhibits no distension and no mass. There is no tenderness. There is no rebound and no guarding.  Musculoskeletal: Normal range of motion. He exhibits no edema and no tenderness.  Lymphadenopathy:    He has no cervical adenopathy.  Neurological: He is oriented to person, place, and time.  Skin: Skin is warm and dry. No rash noted. He is not diaphoretic. No erythema. No pallor.  Psychiatric: He has a normal mood and affect. His behavior is normal. Judgment and thought content normal.     Lab Results  Component Value Date   WBC 7.4 08/17/2011   HGB 13.5 08/17/2011   HCT 40.1 08/17/2011   PLT 204.0 08/17/2011   GLUCOSE 104* 03/16/2012   CHOL 149 02/09/2012   TRIG 136.0 02/09/2012   HDL 53.40 02/09/2012   LDLDIRECT 120.7 07/29/2010   LDLCALC 68 02/09/2012   ALT 26 02/09/2012   AST 30 02/09/2012   NA 142 03/16/2012   K 3.6 03/16/2012   CL 103 03/16/2012   CREATININE 1.2 03/16/2012   BUN 18 03/16/2012   CO2 30 03/16/2012   TSH 1.17 08/17/2011   PSA 0.77 08/17/2011   INR 3.8  10/30/2011       Assessment & Plan:

## 2012-05-12 ENCOUNTER — Ambulatory Visit (HOSPITAL_COMMUNITY): Payer: Medicare Other | Attending: Cardiology | Admitting: Radiology

## 2012-05-12 DIAGNOSIS — I4891 Unspecified atrial fibrillation: Secondary | ICD-10-CM

## 2012-05-12 DIAGNOSIS — E785 Hyperlipidemia, unspecified: Secondary | ICD-10-CM | POA: Insufficient documentation

## 2012-05-12 DIAGNOSIS — IMO0002 Reserved for concepts with insufficient information to code with codable children: Secondary | ICD-10-CM | POA: Insufficient documentation

## 2012-05-12 DIAGNOSIS — I10 Essential (primary) hypertension: Secondary | ICD-10-CM | POA: Insufficient documentation

## 2012-05-12 DIAGNOSIS — R5381 Other malaise: Secondary | ICD-10-CM | POA: Insufficient documentation

## 2012-05-12 DIAGNOSIS — R0602 Shortness of breath: Secondary | ICD-10-CM | POA: Insufficient documentation

## 2012-05-12 DIAGNOSIS — R5383 Other fatigue: Secondary | ICD-10-CM | POA: Insufficient documentation

## 2012-05-12 NOTE — Progress Notes (Signed)
Echocardiogram performed.  

## 2012-06-16 ENCOUNTER — Other Ambulatory Visit: Payer: Self-pay | Admitting: Cardiology

## 2012-07-05 ENCOUNTER — Other Ambulatory Visit: Payer: Self-pay | Admitting: Internal Medicine

## 2012-07-19 ENCOUNTER — Other Ambulatory Visit: Payer: Self-pay | Admitting: Internal Medicine

## 2012-07-19 ENCOUNTER — Other Ambulatory Visit: Payer: Self-pay | Admitting: Cardiology

## 2012-07-25 ENCOUNTER — Telehealth: Payer: Self-pay | Admitting: *Deleted

## 2012-07-25 MED ORDER — ALLOPURINOL 100 MG PO TABS: 100.0000 mg | ORAL_TABLET | Freq: Every day | ORAL | Status: DC

## 2012-07-25 NOTE — Telephone Encounter (Signed)
done

## 2012-07-25 NOTE — Telephone Encounter (Signed)
Pt called states [facility] sent him a letter advising him he needs a new Rx faxed for Allopurinal 100mg .

## 2012-08-26 ENCOUNTER — Encounter: Payer: Self-pay | Admitting: Internal Medicine

## 2012-08-26 ENCOUNTER — Other Ambulatory Visit (INDEPENDENT_AMBULATORY_CARE_PROVIDER_SITE_OTHER): Payer: Medicare Other

## 2012-08-26 ENCOUNTER — Ambulatory Visit (INDEPENDENT_AMBULATORY_CARE_PROVIDER_SITE_OTHER): Payer: Medicare Other | Admitting: Internal Medicine

## 2012-08-26 ENCOUNTER — Ambulatory Visit (INDEPENDENT_AMBULATORY_CARE_PROVIDER_SITE_OTHER)
Admission: RE | Admit: 2012-08-26 | Discharge: 2012-08-26 | Disposition: A | Discharge status: Home / Self Care | Payer: Medicare Other | Source: Ambulatory Visit | Attending: Internal Medicine | Admitting: Internal Medicine

## 2012-08-26 VITALS — BP 142/82 | HR 73 | Temp 97.6°F | Resp 16 | Wt 189.0 lb

## 2012-08-26 DIAGNOSIS — R7309 Other abnormal glucose: Secondary | ICD-10-CM

## 2012-08-26 DIAGNOSIS — M25473 Effusion, unspecified ankle: Secondary | ICD-10-CM

## 2012-08-26 DIAGNOSIS — M25476 Effusion, unspecified foot: Secondary | ICD-10-CM | POA: Insufficient documentation

## 2012-08-26 DIAGNOSIS — M109 Gout, unspecified: Secondary | ICD-10-CM

## 2012-08-26 DIAGNOSIS — M25475 Effusion, left foot: Secondary | ICD-10-CM

## 2012-08-26 DIAGNOSIS — I10 Essential (primary) hypertension: Secondary | ICD-10-CM

## 2012-08-26 LAB — COMPREHENSIVE METABOLIC PANEL
AST: 25 U/L (ref 0–37)
Albumin: 4.1 g/dL (ref 3.5–5.2)
Alkaline Phosphatase: 67 U/L (ref 39–117)
BUN: 15 mg/dL (ref 6–23)
Potassium: 4.1 mEq/L (ref 3.5–5.1)
Sodium: 138 mEq/L (ref 135–145)
Total Bilirubin: 1 mg/dL (ref 0.3–1.2)
Total Protein: 7.3 g/dL (ref 6.0–8.3)

## 2012-08-26 LAB — CBC WITH DIFFERENTIAL/PLATELET
Eosinophils Absolute: 0.1 10*3/uL (ref 0.0–0.7)
HCT: 39 % (ref 39.0–52.0)
Lymphs Abs: 1.1 10*3/uL (ref 0.7–4.0)
MCHC: 33.5 g/dL (ref 30.0–36.0)
MCV: 94.5 fl (ref 78.0–100.0)
Monocytes Absolute: 0.7 10*3/uL (ref 0.1–1.0)
Neutrophils Relative %: 77.8 % — ABNORMAL HIGH (ref 43.0–77.0)
Platelets: 182 10*3/uL (ref 150.0–400.0)

## 2012-08-26 LAB — SEDIMENTATION RATE: Sed Rate: 41 mm/hr — ABNORMAL HIGH (ref 0–22)

## 2012-08-26 LAB — URIC ACID: Uric Acid, Serum: 5.2 mg/dL (ref 4.0–7.8)

## 2012-08-26 MED ORDER — COLCHICINE 0.6 MG PO TABS: 0.6000 mg | ORAL_TABLET | Freq: Two times a day (BID) | ORAL | Status: DC

## 2012-08-26 MED ORDER — CELECOXIB 200 MG PO CAPS
200.0000 mg | ORAL_CAPSULE | Freq: Every day | ORAL | Status: DC
Start: 2012-08-26 — End: 2013-02-16

## 2012-08-26 MED ORDER — METHYLPREDNISOLONE ACETATE 80 MG/ML IJ SUSP
120.0000 mg | Freq: Once | INTRAMUSCULAR | Status: AC
  Administered 2012-08-26: 120 mg via INTRAMUSCULAR

## 2012-08-26 MED ORDER — OXYCODONE-ACETAMINOPHEN 7.5-325 MG PO TABS: 1.0000 | ORAL_TABLET | ORAL | Status: DC | PRN

## 2012-08-26 NOTE — Progress Notes (Signed)
  Subjective:    Patient ID: [name], male    DOB: 28-Sep-1932, 79 y.o.   [name]: [id number]  HPI  He returns c/o a 2 day history of left foot pain and swelling - no hx of trauma. The pain started around the MTP joints and spread to the back of the foot. He has not been taking allopurinol or colchicine.  Review of Systems  Constitutional: Negative.  Negative for fever, chills, diaphoresis, activity change, appetite change, fatigue and unexpected weight change.  HENT: Negative.   Eyes: Negative.   Respiratory: Negative.  Negative for apnea, cough, choking, shortness of breath, wheezing and stridor.   Cardiovascular: Negative.  Negative for chest pain, palpitations and leg swelling.  Gastrointestinal: Negative.  Negative for nausea, vomiting, abdominal pain, diarrhea and constipation.  Endocrine: Negative.   Genitourinary: Negative.   Musculoskeletal: Positive for arthralgias. Negative for myalgias, back pain, joint swelling and gait problem.  Skin: Negative.   Allergic/Immunologic: Negative.   Neurological: Negative.   Hematological: Negative.  Negative for adenopathy. Does not bruise/bleed easily.  Psychiatric/Behavioral: Negative.        Objective:   Physical Exam  Vitals reviewed. Constitutional: He is oriented to person, place, and time. He appears well-developed and well-nourished.  Non-toxic appearance. He does not have a sickly appearance. He does not appear ill. No distress.  HENT:  Head: Normocephalic and atraumatic.  Mouth/Throat: Oropharynx is clear and moist. No oropharyngeal exudate.  Eyes: Conjunctivae are normal. Right eye exhibits no discharge. Left eye exhibits no discharge. No scleral icterus.  Neck: Normal range of motion. Neck supple. No JVD present. No tracheal deviation present. No thyromegaly present.  Cardiovascular: Normal rate, regular rhythm, normal heart sounds and intact distal pulses.  Exam reveals no gallop and no friction rub.   No murmur  heard. Pulmonary/Chest: Effort normal and breath sounds normal. No stridor. No respiratory distress. He has no wheezes. He has no rales. He exhibits no tenderness.  Abdominal: Soft. Bowel sounds are normal. He exhibits no distension and no mass. There is no tenderness. There is no rebound and no guarding.  Musculoskeletal: Normal range of motion. He exhibits no edema and no tenderness.       Left foot: He exhibits swelling. He exhibits normal range of motion, no bony tenderness, normal capillary refill, no crepitus, no deformity and no laceration.       Feet:  Lymphadenopathy:    He has no cervical adenopathy.  Neurological: He is oriented to person, place, and time.  Skin: Skin is warm and dry. No rash noted. He is not diaphoretic. No erythema. No pallor.  Psychiatric: His behavior is normal. Judgment and thought content normal.     Lab Results  Component Value Date   WBC 7.4 08/17/2011   HGB 13.5 08/17/2011   HCT 40.1 08/17/2011   PLT 204.0 08/17/2011   GLUCOSE 104* 03/16/2012   CHOL 149 02/09/2012   TRIG 136.0 02/09/2012   HDL 53.40 02/09/2012   LDLDIRECT 120.7 07/29/2010   LDLCALC 68 02/09/2012   ALT 26 02/09/2012   AST 30 02/09/2012   NA 142 03/16/2012   K 3.6 03/16/2012   CL 103 03/16/2012   CREATININE 1.2 03/16/2012   BUN 18 03/16/2012   CO2 30 03/16/2012   TSH 1.17 08/17/2011   PSA 0.77 08/17/2011   INR 3.8 10/30/2011       Assessment & Plan:

## 2012-08-26 NOTE — Patient Instructions (Signed)
Arthritis - Gout Gout is caused by uric acid crystals forming in the joint. The big toe, foot, ankle, and knee are the joints most often affected.Often uric acid levels in the blood are also elevated. Symptoms develop rapidly, usually over hours. A joint fluid exam may be needed to prove the diagnosis. Acute gout episodes may follow a minor injury, surgery, illness or medication change. Gout occurs more often in men. It tends to be an inherited (passed down from a family member) condition. Your chance of having gout is increased if you take certain medications. These include water pills (diuretics), aspirin, niacin, and cyclosporine. Kidney disease and alcohol consumption may also increase your chances of getting gout. Months or years can pass between gout attacks.  Treatment includes ice, rest and raising the affected limb until the swelling and pain are better.Anti-inflammatory medicine usually brings about dramatic relief of pain, redness and swelling within 2 to 3 days. Other medications can also be effective. Increase your fluid intake. Do not drink alcohol or eat liver, sweetbreads, or sardines. Long-term gout management may require medicine to lower blood uric acid levels, or changing or stopping diuretics. Please see your caregiver if your condition is not better after 1 to 2 days of treatment.  SEEK IMMEDIATE MEDICAL CARE IF:  You have more serious symptoms such as a fever, skin rash, diarrhea, vomiting, or other joint pains. Document Released: 02/06/2004 Document Revised: 12/18/2010 Document Reviewed: 02/20/2008 [employer] Patient Information 2012 [employer]. 

## 2012-08-28 ENCOUNTER — Encounter: Payer: Self-pay | Admitting: Internal Medicine

## 2012-08-28 NOTE — Assessment & Plan Note (Signed)
I will check his A1C to see if he has developed DM2 

## 2012-08-28 NOTE — Assessment & Plan Note (Signed)
Xray and labs are c/w acute gouty arthritis

## 2012-08-28 NOTE — Assessment & Plan Note (Signed)
He is having a severe flare so I gave him an injection of depo-medrol IM He will restart colchicine and will take percocet for the pain He will not restart allopurinol until this flare has resolved

## 2012-09-10 ENCOUNTER — Other Ambulatory Visit: Payer: Self-pay | Admitting: Cardiology

## 2012-09-19 ENCOUNTER — Other Ambulatory Visit: Payer: Self-pay

## 2012-09-19 DIAGNOSIS — M109 Gout, unspecified: Secondary | ICD-10-CM

## 2012-09-19 MED ORDER — COLCHICINE 0.6 MG PO TABS: 0.6000 mg | ORAL_TABLET | Freq: Two times a day (BID) | ORAL | Status: DC

## 2012-09-19 MED ORDER — DOXEPIN HCL 10 MG PO CAPS: ORAL_CAPSULE | ORAL | Status: DC

## 2012-10-12 ENCOUNTER — Other Ambulatory Visit: Payer: Self-pay | Admitting: Internal Medicine

## 2012-11-02 ENCOUNTER — Ambulatory Visit (INDEPENDENT_AMBULATORY_CARE_PROVIDER_SITE_OTHER): Payer: Medicare Other | Admitting: Internal Medicine

## 2012-11-02 ENCOUNTER — Ambulatory Visit (INDEPENDENT_AMBULATORY_CARE_PROVIDER_SITE_OTHER): Payer: Medicare Other | Admitting: Family Medicine

## 2012-11-02 ENCOUNTER — Encounter: Payer: Self-pay | Admitting: Family Medicine

## 2012-11-02 ENCOUNTER — Encounter: Payer: Self-pay | Admitting: Internal Medicine

## 2012-11-02 VITALS — BP 132/82 | HR 94 | Wt 189.0 lb

## 2012-11-02 VITALS — BP 132/82 | HR 94 | Temp 97.6°F | Resp 16 | Ht 70.0 in | Wt 189.8 lb

## 2012-11-02 DIAGNOSIS — M25562 Pain in left knee: Secondary | ICD-10-CM | POA: Insufficient documentation

## 2012-11-02 DIAGNOSIS — M25569 Pain in unspecified knee: Secondary | ICD-10-CM

## 2012-11-02 DIAGNOSIS — M109 Gout, unspecified: Secondary | ICD-10-CM

## 2012-11-02 MED ORDER — OXYCODONE-ACETAMINOPHEN 7.5-325 MG PO TABS: 1.0000 | ORAL_TABLET | ORAL | Status: DC | PRN

## 2012-11-02 NOTE — Assessment & Plan Note (Signed)
IA steroids were given by Dr. Katrinka Blazing He will increase the colchicine to TID I have advised him to start percocet for the pain

## 2012-11-02 NOTE — Patient Instructions (Signed)
Arthritis - Gout Gout is caused by uric acid crystals forming in the joint. The big toe, foot, ankle, and knee are the joints most often affected.Often uric acid levels in the blood are also elevated. Symptoms develop rapidly, usually over hours. A joint fluid exam may be needed to prove the diagnosis. Acute gout episodes may follow a minor injury, surgery, illness or medication change. Gout occurs more often in men. It tends to be an inherited (passed down from a family member) condition. Your chance of having gout is increased if you take certain medications. These include water pills (diuretics), aspirin, niacin, and cyclosporine. Kidney disease and alcohol consumption may also increase your chances of getting gout. Months or years can pass between gout attacks.  Treatment includes ice, rest and raising the affected limb until the swelling and pain are better.Anti-inflammatory medicine usually brings about dramatic relief of pain, redness and swelling within 2 to 3 days. Other medications can also be effective. Increase your fluid intake. Do not drink alcohol or eat liver, sweetbreads, or sardines. Long-term gout management may require medicine to lower blood uric acid levels, or changing or stopping diuretics. Please see your caregiver if your condition is not better after 1 to 2 days of treatment.  SEEK IMMEDIATE MEDICAL CARE IF:  You have more serious symptoms such as a fever, skin rash, diarrhea, vomiting, or other joint pains. Document Released: 02/06/2004 Document Revised: 12/18/2010 Document Reviewed: 02/20/2008 [employer] Patient Information 2012 [employer]. 

## 2012-11-02 NOTE — Assessment & Plan Note (Signed)
I do believe patient's physical findings as well as ultrasound findings to correlate well with acute gouty arthropathy. Patient does not have a fever today and we will hold on any blood work. Patient knows if he does get a fever or chills he will come back immediately for further evaluation. Patient does not have any history of inflammatory arthritis will continue to monitor with the synovitis that was seen on ultrasound. Patient was given a list of different fluids to potentially avoid which she states he will not follow Patient will come back in 48 hours if not better or otherwise followup when [place] with his primary care provider.

## 2012-11-02 NOTE — Progress Notes (Signed)
  Subjective:    Patient ID: [name], male    DOB: 11/18/1932, 79 y.o.   [name]: [id number]  Arthritis Presents for follow-up visit. The disease course has been worsening. The condition has lasted for 2 days. He complains of pain, stiffness, joint swelling and joint warmth. His pain is at a severity of 7/10. Pertinent negatives include no fatigue or fever.      Review of Systems  Constitutional: Negative.  Negative for fever, chills, diaphoresis, appetite change and fatigue.  HENT: Negative.   Eyes: Negative.   Respiratory: Negative.   Cardiovascular: Negative.   Gastrointestinal: Negative.   Endocrine: Negative.   Genitourinary: Negative.   Musculoskeletal: Positive for arthralgias, arthritis, joint swelling and stiffness. Negative for back pain, gait problem and neck pain.  Allergic/Immunologic: Negative.   Neurological: Negative.  Negative for dizziness, tremors, weakness and light-headedness.  Hematological: Negative.  Negative for adenopathy. Does not bruise/bleed easily.  Psychiatric/Behavioral: Negative.        Objective:   Physical Exam  Vitals reviewed. Constitutional: He is oriented to person, place, and time. He appears well-developed and well-nourished. No distress.  HENT:  Head: Normocephalic and atraumatic.  Mouth/Throat: Oropharynx is clear and moist. No oropharyngeal exudate.  Eyes: Conjunctivae are normal. Right eye exhibits no discharge. Left eye exhibits no discharge. No scleral icterus.  Neck: Normal range of motion. Neck supple. No JVD present. No tracheal deviation present. No thyromegaly present.  Cardiovascular: Normal rate, regular rhythm, normal heart sounds and intact distal pulses.  Exam reveals no gallop and no friction rub.   No murmur heard. Pulmonary/Chest: Effort normal and breath sounds normal. No stridor. No respiratory distress. He has no wheezes. He has no rales. He exhibits no tenderness.  Abdominal: Soft. Bowel sounds are normal. He  exhibits no distension and no mass. There is no tenderness. There is no rebound and no guarding.  Musculoskeletal: He exhibits no edema and no tenderness.       Left knee: He exhibits decreased range of motion, swelling and effusion. He exhibits no ecchymosis, no deformity and no erythema.  Lymphadenopathy:    He has no cervical adenopathy.  Neurological: He is oriented to person, place, and time.  Skin: Skin is warm and dry. No rash noted. He is not diaphoretic. No erythema. No pallor.     Lab Results  Component Value Date   WBC 9.0 08/26/2012   HGB 13.1 08/26/2012   HCT 39.0 08/26/2012   PLT 182.0 08/26/2012   GLUCOSE 99 08/26/2012   CHOL 149 02/09/2012   TRIG 136.0 02/09/2012   HDL 53.40 02/09/2012   LDLDIRECT 120.7 07/29/2010   LDLCALC 68 02/09/2012   ALT 16 08/26/2012   AST 25 08/26/2012   NA 138 08/26/2012   K 4.1 08/26/2012   CL 103 08/26/2012   CREATININE 1.1 08/26/2012   BUN 15 08/26/2012   CO2 28 08/26/2012   TSH 1.17 08/17/2011   PSA 0.77 08/17/2011   INR 3.8 10/30/2011   HGBA1C 6.0 08/26/2012       Assessment & Plan:

## 2012-11-02 NOTE — Progress Notes (Signed)
  I'm seeing this patient by the request  of:  Sanda Linger, MD   CC: left knee pain and swelling.   HPI: Patient is a pleasant 79 year old gentleman coming in with severe left knee pain the patient does have a past medical history is significant for gout. Patient states that the swelling has been going on for approximately 2 days. He is complaining of worsening stiffening as well as joint warmness. Patient denies fever or chills but states it seems to be getting worse. Patient states that he did no better this did feel like his gout pain. He has never had in his knee previously. Patient does not never any injury. Patient describes the pain as severe sharp unrelenting pain. Patient notes the severity of 10 out of 10. Nothing has seemed to help.   Past medical, surgical, family and social history reviewed. Medications reviewed all in the electronic medical record.   Review of Systems: No headache, visual changes, nausea, vomiting, diarrhea, constipation, dizziness, abdominal pain, skin rash, fevers, chills, night sweats, weight loss, swollen lymph nodes, body aches, joint swelling, muscle aches, chest pain, shortness of breath, mood changes.   Objective:    Blood pressure 132/82, pulse 94, weight 189 lb (85.73 kg), SpO2 97.00%.   General: No apparent distress alert and oriented x3 mood and affect normal, dressed appropriately.  HEENT: Pupils equal, extraocular movements intact Respiratory: Patient's speak in full sentences and does not appear short of breath Cardiovascular: No lower extremity edema, non tender, no erythema Skin: Patient's right knee does have some erythema and is warm to touch.  Abdomen: Soft nontender Neuro: Cranial nerves II through XII are intact, neurovascularly intact in all extremities with 2+ DTRs and 2+ pulses. Lymph: No lymphadenopathy of posterior or anterior cervical chain or axillae bilaterally.  Gait normal with good balance and coordination.  MSK: Non tender  with full range of motion and good stability and symmetric strength and tone of shoulders, elbows, wrist, hip, and ankles bilaterally.  Knee: Left knee Significant swelling and erythema. Patient does have a 2+ effusion palpation does show that patient does have significant warts to the knee. Patient is tender even to light palpation. This seems to be generalized Patient unable to bend her than 45 secondary to pain Ligaments with solid consistent endpoints including ACL, PCL, LCL, MCL. Neurovascularly intact distally.  Procedure skeletal ultrasound was performed and interpreted by me today. Patient does have an effusion of the knee with appears to be a synovitis. There is potentially also tophi formation occurring.  After informed written and verbal consent, patient was seated on exam table. Left knee was prepped with alcohol swab and utilizing superior lateral approach, patient's left knee space was injected with 4:1 :marcaine 0.5%: Kenalog 40mg /dL. Patient tolerated the procedure well without immediate complications. This is done with ultrasound guidance.  Impression and Recommendations:     This case required medical decision making of moderate complexity.

## 2012-11-07 ENCOUNTER — Ambulatory Visit (INDEPENDENT_AMBULATORY_CARE_PROVIDER_SITE_OTHER): Payer: Medicare Other | Admitting: *Deleted

## 2012-11-07 DIAGNOSIS — Z950 Presence of cardiac pacemaker: Secondary | ICD-10-CM

## 2012-11-07 DIAGNOSIS — I495 Sick sinus syndrome: Secondary | ICD-10-CM

## 2012-11-07 DIAGNOSIS — I4891 Unspecified atrial fibrillation: Secondary | ICD-10-CM

## 2012-11-07 LAB — PACEMAKER DEVICE OBSERVATION
BMOD-0004RV: 2
BRDY-0002RV: 60 {beats}/min
BRDY-0004RV: 120 {beats}/min
DEVICE MODEL PM: 124041
RV LEAD IMPEDENCE PM: 540 Ohm

## 2012-11-07 NOTE — Progress Notes (Signed)
Device check in clinic, all functions normal, no changes made, full details in [facility].  ROV w/ Dr. Taylor in 6mo.   

## 2012-11-10 ENCOUNTER — Encounter: Payer: Self-pay | Admitting: Internal Medicine

## 2012-11-24 ENCOUNTER — Other Ambulatory Visit: Payer: Self-pay | Admitting: Cardiology

## 2012-11-30 ENCOUNTER — Encounter: Payer: Self-pay | Admitting: Internal Medicine

## 2013-01-02 ENCOUNTER — Ambulatory Visit (INDEPENDENT_AMBULATORY_CARE_PROVIDER_SITE_OTHER): Payer: Medicare Other | Admitting: Internal Medicine

## 2013-01-02 ENCOUNTER — Encounter: Payer: Self-pay | Admitting: Internal Medicine

## 2013-01-02 ENCOUNTER — Other Ambulatory Visit (INDEPENDENT_AMBULATORY_CARE_PROVIDER_SITE_OTHER): Payer: Medicare Other

## 2013-01-02 VITALS — BP 142/98 | HR 108 | Temp 98.2°F | Resp 16 | Ht 70.0 in | Wt 180.0 lb

## 2013-01-02 DIAGNOSIS — K5732 Diverticulitis of large intestine without perforation or abscess without bleeding: Secondary | ICD-10-CM

## 2013-01-02 DIAGNOSIS — R10814 Left lower quadrant abdominal tenderness: Secondary | ICD-10-CM

## 2013-01-02 LAB — URINALYSIS, ROUTINE W REFLEX MICROSCOPIC
Bilirubin Urine: NEGATIVE
Ketones, ur: NEGATIVE
Leukocytes, UA: NEGATIVE
Nitrite: NEGATIVE
Total Protein, Urine: 30 — AB
Urine Glucose: NEGATIVE
pH: 5.5 (ref 5.0–8.0)

## 2013-01-02 LAB — COMPREHENSIVE METABOLIC PANEL
ALT: 37 U/L (ref 0–53)
AST: 33 U/L (ref 0–37)
Albumin: 4.4 g/dL (ref 3.5–5.2)
Alkaline Phosphatase: 82 U/L (ref 39–117)
BUN: 17 mg/dL (ref 6–23)
Calcium: 9.7 mg/dL (ref 8.4–10.5)
Chloride: 106 mEq/L (ref 96–112)
Creatinine, Ser: 1 mg/dL (ref 0.4–1.5)
Glucose, Bld: 101 mg/dL — ABNORMAL HIGH (ref 70–99)
Potassium: 3.4 mEq/L — ABNORMAL LOW (ref 3.5–5.1)
Sodium: 141 mEq/L (ref 135–145)
Total Protein: 7.4 g/dL (ref 6.0–8.3)

## 2013-01-02 LAB — CBC WITH DIFFERENTIAL/PLATELET
Basophils Absolute: 0 10*3/uL (ref 0.0–0.1)
Eosinophils Absolute: 0.1 10*3/uL (ref 0.0–0.7)
Hemoglobin: 14.5 g/dL (ref 13.0–17.0)
Lymphocytes Relative: 24.2 % (ref 12.0–46.0)
MCHC: 33.3 g/dL (ref 30.0–36.0)
MCV: 94.2 fl (ref 78.0–100.0)
Monocytes Absolute: 0.5 10*3/uL (ref 0.1–1.0)
Neutro Abs: 4.3 10*3/uL (ref 1.4–7.7)
Neutrophils Relative %: 65.8 % (ref 43.0–77.0)
Platelets: 162 10*3/uL (ref 150.0–400.0)
RBC: 4.61 Mil/uL (ref 4.22–5.81)
RDW: 14.9 % — ABNORMAL HIGH (ref 11.5–14.6)

## 2013-01-02 LAB — LIPASE: Lipase: 29 U/L (ref 11.0–59.0)

## 2013-01-02 LAB — AMYLASE: Amylase: 130 U/L (ref 27–131)

## 2013-01-02 MED ORDER — SULFAMETHOXAZOLE-TRIMETHOPRIM 800-160 MG PO TABS: 1.0000 | ORAL_TABLET | Freq: Two times a day (BID) | ORAL | Status: DC

## 2013-01-02 NOTE — Assessment & Plan Note (Signed)
The exam is c/w diverticulitis and he tells me that he is getting better The exam today is benign I have ordered labs to see if there are concerns for pancreatitis, hepatitis, UTI, renal stone, anemia, systemic infection

## 2013-01-02 NOTE — Progress Notes (Signed)
Pre visit review using our clinic review tool, if applicable. No additional management support is needed unless otherwise documented below in the visit note. 

## 2013-01-02 NOTE — Progress Notes (Signed)
Subjective:    Patient ID: [name], male    DOB: 30-Jun-1932, 79 y.o.   [name]: [id number]  Abdominal Pain This is a new problem. The current episode started in the past 7 days. The onset quality is gradual. The problem occurs intermittently. The most recent episode lasted 7 days. The problem has been rapidly improving. The pain is located in the LLQ. The pain is at a severity of 1/10. The pain is mild. The quality of the pain is aching. The abdominal pain does not radiate. Associated symptoms include diarrhea (3-4 watery BM's per day). Pertinent negatives include no anorexia, arthralgias, belching, constipation, dysuria, fever, flatus, frequency, headaches, hematochezia, hematuria, melena, myalgias, nausea, vomiting or weight loss. Nothing aggravates the pain. The pain is relieved by nothing. He has tried nothing for the symptoms. There is no history of abdominal surgery, colon cancer, Crohn's disease, gallstones, GERD, irritable bowel syndrome, pancreatitis, PUD or ulcerative colitis.      Review of Systems  Constitutional: Negative.  Negative for fever, chills, weight loss, diaphoresis, activity change, appetite change, fatigue and unexpected weight change.  HENT: Negative.   Eyes: Negative.   Respiratory: Negative.  Negative for cough, chest tightness, shortness of breath, wheezing and stridor.   Cardiovascular: Negative.  Negative for chest pain, palpitations and leg swelling.  Gastrointestinal: Positive for abdominal pain and diarrhea (3-4 watery BM's per day). Negative for nausea, vomiting, constipation, blood in stool, melena, hematochezia, abdominal distention, anal bleeding, rectal pain, anorexia and flatus.  Endocrine: Negative.   Genitourinary: Negative.  Negative for dysuria, urgency, frequency, hematuria, flank pain, discharge, penile swelling, scrotal swelling, enuresis, difficulty urinating, genital sores, penile pain and testicular pain.  Musculoskeletal: Negative.  Negative  for arthralgias and myalgias.  Skin: Negative.   Allergic/Immunologic: Negative.   Neurological: Negative.  Negative for headaches.  Hematological: Negative.  Negative for adenopathy. Does not bruise/bleed easily.  Psychiatric/Behavioral: Negative.        Objective:   Physical Exam  Vitals reviewed. Constitutional: He is oriented to person, place, and time. He appears well-developed and well-nourished.  Non-toxic appearance. He does not have a sickly appearance. He does not appear ill. No distress.  HENT:  Head: Normocephalic and atraumatic.  Mouth/Throat: Oropharynx is clear and moist. No oropharyngeal exudate.  Eyes: Conjunctivae are normal. Right eye exhibits no discharge. Left eye exhibits no discharge. No scleral icterus.  Neck: Normal range of motion. Neck supple. No JVD present. No tracheal deviation present. No thyromegaly present.  Cardiovascular: Normal rate, regular rhythm, normal heart sounds and intact distal pulses.  Exam reveals no gallop and no friction rub.   No murmur heard. Pulmonary/Chest: Effort normal and breath sounds normal. No stridor. No respiratory distress. He has no wheezes. He has no rales. He exhibits no tenderness.  Abdominal: Soft. Normal appearance and bowel sounds are normal. He exhibits no shifting dullness, no distension, no pulsatile liver, no fluid wave, no abdominal bruit, no ascites, no pulsatile midline mass and no mass. There is no hepatosplenomegaly, splenomegaly or hepatomegaly. There is tenderness in the left lower quadrant. There is no rigidity, no rebound, no guarding, no CVA tenderness, no tenderness at McBurney's point and negative Murphy's sign. No hernia. Hernia confirmed negative in the ventral area, confirmed negative in the right inguinal area and confirmed negative in the left inguinal area.  Musculoskeletal: Normal range of motion. He exhibits no edema and no tenderness.  Lymphadenopathy:    He has no cervical adenopathy.    Neurological: He  is oriented to person, place, and time.  Skin: Skin is warm and dry. No rash noted. He is not diaphoretic. No erythema. No pallor.  Psychiatric: He has a normal mood and affect. His behavior is normal. Judgment and thought content normal.     Lab Results  Component Value Date   WBC 9.0 08/26/2012   HGB 13.1 08/26/2012   HCT 39.0 08/26/2012   PLT 182.0 08/26/2012   GLUCOSE 99 08/26/2012   CHOL 149 02/09/2012   TRIG 136.0 02/09/2012   HDL 53.40 02/09/2012   LDLDIRECT 120.7 07/29/2010   LDLCALC 68 02/09/2012   ALT 16 08/26/2012   AST 25 08/26/2012   NA 138 08/26/2012   K 4.1 08/26/2012   CL 103 08/26/2012   CREATININE 1.1 08/26/2012   BUN 15 08/26/2012   CO2 28 08/26/2012   TSH 1.17 08/17/2011   PSA 0.77 08/17/2011   INR 3.8 10/30/2011   HGBA1C 6.0 08/26/2012       Assessment & Plan:

## 2013-01-02 NOTE — Assessment & Plan Note (Signed)
CT scan done about one year ago showed diverticulosis and his s/s today are c/w diverticulitis I have asked him to start SMX-TMP for this

## 2013-01-02 NOTE — Patient Instructions (Signed)
Diverticulitis °A diverticulum is a small pouch or sac on the colon. Diverticulosis is the presence of these diverticula on the colon. Diverticulitis is the irritation (inflammation) or infection of diverticula. °CAUSES  °The colon and its diverticula contain bacteria. If food particles block the tiny opening to a diverticulum, the bacteria inside can grow and cause an increase in pressure. This leads to infection and inflammation and is called diverticulitis. °SYMPTOMS  °· Abdominal pain and tenderness. Usually, the pain is located on the left side of your abdomen. However, it could be located elsewhere. °· Fever. °· Bloating. °· Feeling sick to your stomach (nausea). °· Throwing up (vomiting). °· Abnormal stools. °DIAGNOSIS  °Your caregiver will take a history and perform a physical exam. Since many things can cause abdominal pain, other tests may be necessary. Tests may include: °· Blood tests. °· Urine tests. °· X-ray of the abdomen. °· CT scan of the abdomen. °Sometimes, surgery is needed to determine if diverticulitis or other conditions are causing your symptoms. °TREATMENT  °Most of the time, you can be treated without surgery. Treatment includes: °· Resting the bowels by only having liquids for a few days. As you improve, you will need to eat a low-fiber diet. °· Intravenous (IV) fluids if you are losing body fluids (dehydrated). °· Antibiotic medicines that treat infections may be given. °· Pain and nausea medicine, if needed. °· Surgery if the inflamed diverticulum has burst. °HOME CARE INSTRUCTIONS  °· Try a clear liquid diet (broth, tea, or water for as long as directed by your caregiver). You may then gradually begin a low-fiber diet as tolerated.  °A low-fiber diet is a diet with less than 10 grams of fiber. Choose the foods below to reduce fiber in the diet: °· White breads, cereals, rice, and pasta. °· Cooked fruits and vegetables or soft fresh fruits and vegetables without the skin. °· Ground or  well-cooked tender beef, ham, veal, lamb, pork, or poultry. °· Eggs and seafood. °· After your diverticulitis symptoms have improved, your caregiver may put you on a high-fiber diet. A high-fiber diet includes 14 grams of fiber for every 1000 calories consumed. For a standard 2000 calorie diet, you would need 28 grams of fiber. Follow these diet guidelines to help you increase the fiber in your diet. It is important to slowly increase the amount fiber in your diet to avoid gas, constipation, and bloating. °· Choose whole-grain breads, cereals, pasta, and brown rice. °· Choose fresh fruits and vegetables with the skin on. Do not overcook vegetables because the more vegetables are cooked, the more fiber is lost. °· Choose more nuts, seeds, legumes, dried peas, beans, and lentils. °· Look for food products that have greater than 3 grams of fiber per serving on the Nutrition Facts label. °· Take all medicine as directed by your caregiver. °· If your caregiver has given you a follow-up appointment, it is very important that you go. Not going could result in lasting (chronic) or permanent injury, pain, and disability. If there is any problem keeping the appointment, call to reschedule. °SEEK MEDICAL CARE IF:  °· Your pain does not improve. °· You have a hard time advancing your diet beyond clear liquids. °· Your bowel movements do not return to normal. °SEEK IMMEDIATE MEDICAL CARE IF:  °· Your pain becomes worse. °· You have an oral temperature above 102° F (38.9° C), not controlled by medicine. °· You have repeated vomiting. °· You have bloody or black, tarry stools. °·   Symptoms that brought you to your caregiver become worse or are not getting better. °MAKE SURE YOU:  °· Understand these instructions. °· Will watch your condition. °· Will get help right away if you are not doing well or get worse. °Document Released: 10/08/2004 Document Revised: 03/23/2011 Document Reviewed: 02/03/2010 °[employer]® Patient Information  ©2014 [employer]. ° °

## 2013-01-03 ENCOUNTER — Other Ambulatory Visit: Payer: Self-pay

## 2013-01-03 MED ORDER — RIVAROXABAN 20 MG PO TABS: 20.0000 mg | ORAL_TABLET | Freq: Every day | ORAL | Status: DC

## 2013-01-24 ENCOUNTER — Other Ambulatory Visit: Payer: Self-pay | Admitting: Cardiology

## 2013-02-04 ENCOUNTER — Other Ambulatory Visit: Payer: Self-pay | Admitting: Cardiology

## 2013-02-06 ENCOUNTER — Other Ambulatory Visit: Payer: Self-pay

## 2013-02-06 MED ORDER — LISINOPRIL 10 MG PO TABS: 10.0000 mg | ORAL_TABLET | Freq: Every day | ORAL | Status: DC

## 2013-02-08 ENCOUNTER — Ambulatory Visit: Payer: Medicare Other | Admitting: Internal Medicine

## 2013-02-10 ENCOUNTER — Other Ambulatory Visit: Payer: Self-pay | Admitting: Cardiology

## 2013-02-16 ENCOUNTER — Ambulatory Visit (INDEPENDENT_AMBULATORY_CARE_PROVIDER_SITE_OTHER): Payer: Medicare Other | Admitting: Cardiology

## 2013-02-16 ENCOUNTER — Encounter: Payer: Self-pay | Admitting: Cardiology

## 2013-02-16 VITALS — BP 146/90 | HR 68 | Ht 70.0 in | Wt 185.0 lb

## 2013-02-16 DIAGNOSIS — I4891 Unspecified atrial fibrillation: Secondary | ICD-10-CM

## 2013-02-16 NOTE — Patient Instructions (Signed)
The current medical regimen is effective;  continue present plan and medications.  Follow up in 1 year with Dr Hochrein.  You will receive a letter in the mail 2 months before you are due.  Please call us when you receive this letter to schedule your follow up appointment.  

## 2013-02-16 NOTE — Progress Notes (Signed)
HPI The patient returns for followup of his known coronary disease. Since I last saw him he has had no new cardiovascular complaints. The patient denies any new symptoms such as chest discomfort, neck or arm discomfort. There has been no new shortness of breath, PND or orthopnea. There have been no reported palpitations, presyncope or syncope. He walks 3 miles on a treadmill without any limitations. He has had no new symptoms since his last stress test.  He did have hand surgery since I last saw him.  He has had some right jaw pain only once.    No Known Allergies  Current Outpatient Prescriptions  Medication Sig Dispense Refill  . allopurinol (ZYLOPRIM) 100 MG tablet Take 1 tablet (100 mg total) by mouth daily.  90 tablet  3  . amLODipine (NORVASC) 10 MG tablet TAKE 1 TABLET DAILY  90 tablet  0  . atorvastatin (LIPITOR) 40 MG tablet TAKE 1 TABLET DAILY  90 tablet  1  . colchicine 0.6 MG tablet Take 1 tablet (0.6 mg total) by mouth 2 (two) times daily.  180 tablet  2  . gabapentin (NEURONTIN) 100 MG capsule Take 100 mg by mouth 3 (three) times daily.       Marland Kitchen. lisinopril (PRINIVIL,ZESTRIL) 20 MG tablet Take 20 mg by mouth daily.      . nitroGLYCERIN (NITROSTAT) 0.4 MG SL tablet Place 1 tablet (0.4 mg total) under the tongue every 5 (five) minutes as needed. For chest pain--may repeat three times.  25 tablet  8  . Potassium Chloride Crys CR (POTASSIUM CHLORIDE CRYS ER PO) Take 30 mg by mouth.      . silodosin (RAPAFLO) 8 MG CAPS capsule Take 1 capsule (8 mg total) by mouth daily with breakfast.  90 capsule  3  . XARELTO 20 MG TABS tablet TAKE 1 TABLET DAILY  90 tablet  0   No current facility-administered medications for this visit.    Past Medical History  Diagnosis Date  . ASCVD (arteriosclerotic cardiovascular disease)   . Decreased left ventricular function   . Ventricular hypokinesis     severe/basilar & mid-inferior and inferoseptal segments  . Scarring     cardio/inferior,  inferolateral & inferoapical w/o ischemia  . Cerebrovascular disease 01/2004    right carotid bruit, minimal plaque on [place]   . Sick sinus syndrome 08/2004    Sinus Bradycardia & AFib  . Chronic anticoagulation   . History of tobacco abuse     20 pack years; d/c in 1983  . Other and unspecified hyperlipidemia   . Gout, unspecified   . Unspecified essential hypertension   . Vertigo   . Osteoarthritis   . Pacemaker 08/2004    dual-chamber Guidant  . CAD (coronary artery disease)   . HTN (hypertension)     Past Surgical History  Procedure Laterality Date  . Transurethral resection of prostate    . Anterior fusion cervical spine  2006    LS [2003]; prior cervical spine procedure w/implantation of hardware[[[  . Cholecystectomy    . Coronary artery bypass graft  1983    three times  . Appendectomy    . Knee surgery      Left, twice  . Pacemaker placement  08/2004    Guidant implant  . Neck surgery    . Back surgery    . Bladder surgery      ROS:  As stated in the HPI and negative for all other systems.  PHYSICAL EXAM  BP 146/90  Pulse 68  Ht 5\' 10"  (1.778 m)  Wt 185 lb (83.915 kg)  BMI 26.54 kg/m2 GENERAL:  Well appearing NECK:  No jugular venous distention, waveform within normal limits, carotid upstroke brisk and symmetric, no bruits, no thyromegaly LYMPHATICS:  No cervical, inguinal adenopathy LUNGS:  Clear to auscultation bilaterally CHEST:  Well healed sternotomy scar, pacemaker pocket HEART:  PMI not displaced or sustained,S1 and S2 within normal limits, no S3, no clicks, no rubs, no murmurs ABD:  Flat, positive bowel sounds normal in frequency in pitch, no bruits, no rebound, no guarding, no midline pulsatile mass, no hepatomegaly, no splenomegaly EXT:  2 plus pulses throughout, no edema, no cyanosis no clubbing SKIN:  Psoriatic rash,  no nodules  EKG:  Atrial fibrillation, rate 66 with ventricular pacing 100% capture.  02/16/2013   ASSESSMENT AND PLAN  CAD -    The patient has no new sypmtoms. No further cardiovascular testing is indicated. We will continue with aggressive risk reduction and meds as listed.   HYPERTENSION -  The blood pressure is at target. No change in medications is indicated. We will continue with therapeutic lifestyle changes (TLC).   HYPERLIPIDEMIA -  He will continue on the meds as listed.   Atrial fibrillation -  The patient is tolerating his Xarelto.  No change in therapy is indicated.

## 2013-02-20 ENCOUNTER — Telehealth: Payer: Self-pay | Admitting: Cardiology

## 2013-02-20 NOTE — Telephone Encounter (Signed)
New message  Patient is having lots of pain with arthritis in his right thumb, He would like to know what he can take for this? Along with all the other medications he takes. Please call and advise.

## 2013-02-20 NOTE — Telephone Encounter (Signed)
Pt aware OK to take Tylenol.  States it doesn't help.  He will discuss with Dr Yetta BarreJones on Friday at his appointment.

## 2013-02-20 NOTE — Telephone Encounter (Signed)
Pt can use Tylenol as directed OTC.  NA times multiple rings - will continue to attempt to call him

## 2013-02-24 ENCOUNTER — Other Ambulatory Visit (INDEPENDENT_AMBULATORY_CARE_PROVIDER_SITE_OTHER): Payer: Medicare Other

## 2013-02-24 ENCOUNTER — Encounter: Payer: Self-pay | Admitting: Internal Medicine

## 2013-02-24 ENCOUNTER — Ambulatory Visit (INDEPENDENT_AMBULATORY_CARE_PROVIDER_SITE_OTHER): Payer: Medicare Other | Admitting: Internal Medicine

## 2013-02-24 VITALS — BP 138/90 | HR 77 | Temp 98.7°F | Resp 16 | Ht 70.0 in | Wt 185.0 lb

## 2013-02-24 DIAGNOSIS — R7309 Other abnormal glucose: Secondary | ICD-10-CM

## 2013-02-24 DIAGNOSIS — E876 Hypokalemia: Secondary | ICD-10-CM

## 2013-02-24 DIAGNOSIS — E785 Hyperlipidemia, unspecified: Secondary | ICD-10-CM

## 2013-02-24 DIAGNOSIS — I10 Essential (primary) hypertension: Secondary | ICD-10-CM

## 2013-02-24 DIAGNOSIS — M1990 Unspecified osteoarthritis, unspecified site: Secondary | ICD-10-CM

## 2013-02-24 LAB — MAGNESIUM: Magnesium: 1.9 mg/dL (ref 1.5–2.5)

## 2013-02-24 LAB — BASIC METABOLIC PANEL
BUN: 16 mg/dL (ref 6–23)
CO2: 26 meq/L (ref 19–32)
CREATININE: 1.1 mg/dL (ref 0.4–1.5)
Calcium: 9.9 mg/dL (ref 8.4–10.5)
Chloride: 105 mEq/L (ref 96–112)
GFR: 68.72 mL/min (ref >60.00)
GLUCOSE: 100 mg/dL — AB (ref 70–99)
Potassium: 4 mEq/L (ref 3.5–5.1)
SODIUM: 140 meq/L (ref 135–145)

## 2013-02-24 LAB — LIPID PANEL
CHOL/HDL RATIO: 2
Cholesterol: 130 mg/dL (ref 0–200)
HDL: 56.3 mg/dL (ref >39.00)
LDL Cholesterol: 62 mg/dL (ref 0–99)
TRIGLYCERIDES: 57 mg/dL (ref 0.0–149.0)
VLDL: 11.4 mg/dL (ref 0.0–40.0)

## 2013-02-24 LAB — HEMOGLOBIN A1C: Hgb A1c MFr Bld: 6 % (ref 4.6–6.5)

## 2013-02-24 MED ORDER — TRAMADOL HCL 50 MG PO TABS: 50.0000 mg | ORAL_TABLET | Freq: Four times a day (QID) | ORAL | Status: DC | PRN

## 2013-02-24 NOTE — Assessment & Plan Note (Signed)
I will check his A1C today to see if he has developed DM2 

## 2013-02-24 NOTE — Assessment & Plan Note (Signed)
I will recheck his FLP today and will address if needed

## 2013-02-24 NOTE — Progress Notes (Signed)
   Subjective:    Patient ID: [name], male    DOB: November 30, 1932, 80 y.o.   [name]: [id number]  Hypertension This is a chronic problem. The current episode started more than 1 year ago. The problem has been gradually improving since onset. The problem is controlled. Pertinent negatives include no anxiety, blurred vision, chest pain, headaches, malaise/fatigue, neck pain, orthopnea, palpitations, peripheral edema, PND, shortness of breath or sweats. Past treatments include calcium channel blockers and ACE inhibitors. There are no compliance problems.  Hypertensive end-organ damage includes CAD/MI.      Review of Systems  Constitutional: Negative for fever, chills, malaise/fatigue, diaphoresis, appetite change and fatigue.  HENT: Negative.   Eyes: Negative.  Negative for blurred vision.  Respiratory: Negative.  Negative for cough, choking, chest tightness, shortness of breath, wheezing and stridor.   Cardiovascular: Negative.  Negative for chest pain, palpitations, orthopnea, leg swelling and PND.  Gastrointestinal: Negative.  Negative for nausea, vomiting, abdominal pain, diarrhea, constipation and blood in stool.  Endocrine: Negative.   Genitourinary: Negative.   Musculoskeletal: Positive for arthralgias (knees, hands, shoulders). Negative for back pain, gait problem, joint swelling, myalgias, neck pain and neck stiffness.  Skin: Negative.   Allergic/Immunologic: Negative.   Neurological: Negative.  Negative for headaches.  Hematological: Negative.  Negative for adenopathy. Does not bruise/bleed easily.  Psychiatric/Behavioral: Negative.        Objective:   Physical Exam  Vitals reviewed. Constitutional: He is oriented to person, place, and time. He appears well-developed and well-nourished. No distress.  HENT:  Head: Normocephalic and atraumatic.  Mouth/Throat: Oropharynx is clear and moist. No oropharyngeal exudate.  Eyes: Conjunctivae are normal. Right eye exhibits no  discharge. Left eye exhibits no discharge. No scleral icterus.  Neck: Normal range of motion. Neck supple. No JVD present. No tracheal deviation present. No thyromegaly present.  Cardiovascular: Normal rate, regular rhythm, normal heart sounds and intact distal pulses.  Exam reveals no gallop and no friction rub.   No murmur heard. Pulmonary/Chest: Effort normal and breath sounds normal. No stridor. No respiratory distress. He has no wheezes. He has no rales. He exhibits no tenderness.  Abdominal: Soft. Bowel sounds are normal. He exhibits no distension and no mass. There is no tenderness. There is no rebound and no guarding.  Musculoskeletal: Normal range of motion. He exhibits no edema and no tenderness.  Lymphadenopathy:    He has no cervical adenopathy.  Neurological: He is oriented to person, place, and time.  Skin: Skin is warm and dry. No rash noted. He is not diaphoretic. No erythema. No pallor.  Psychiatric: He has a normal mood and affect. His behavior is normal. Judgment and thought content normal.    Lab Results  Component Value Date   WBC 6.6 01/02/2013   HGB 14.5 01/02/2013   HCT 43.5 01/02/2013   PLT 162.0 01/02/2013   GLUCOSE 101* 01/02/2013   CHOL 149 02/09/2012   TRIG 136.0 02/09/2012   HDL 53.40 02/09/2012   LDLDIRECT 120.7 07/29/2010   LDLCALC 68 02/09/2012   ALT 37 01/02/2013   AST 33 01/02/2013   NA 141 01/02/2013   K 3.4* 01/02/2013   CL 106 01/02/2013   CREATININE 1.0 01/02/2013   BUN 17 01/02/2013   CO2 28 01/02/2013   TSH 1.17 08/17/2011   PSA 0.77 08/17/2011   INR 3.8 10/30/2011   HGBA1C 6.0 08/26/2012        Assessment & Plan:

## 2013-02-24 NOTE — Patient Instructions (Signed)

## 2013-02-24 NOTE — Assessment & Plan Note (Signed)
His BP is well controlled Will check his lytes and renal function today 

## 2013-02-24 NOTE — Progress Notes (Signed)
Pre visit review using our clinic review tool, if applicable. No additional management support is needed unless otherwise documented below in the visit note. 

## 2013-02-24 NOTE — Assessment & Plan Note (Signed)
Check K+ and Mg++ level today Cont K+ replacement therapy

## 2013-02-25 ENCOUNTER — Encounter: Payer: Self-pay | Admitting: Internal Medicine

## 2013-03-10 ENCOUNTER — Other Ambulatory Visit: Payer: Self-pay | Admitting: Internal Medicine

## 2013-04-07 ENCOUNTER — Other Ambulatory Visit: Payer: Self-pay | Admitting: Cardiology

## 2013-04-20 ENCOUNTER — Other Ambulatory Visit: Payer: Self-pay

## 2013-04-20 MED ORDER — LISINOPRIL 20 MG PO TABS: 20.0000 mg | ORAL_TABLET | Freq: Every day | ORAL | Status: AC

## 2013-04-24 ENCOUNTER — Encounter: Payer: Self-pay | Admitting: Diagnostic Neuroimaging

## 2013-04-24 ENCOUNTER — Ambulatory Visit (INDEPENDENT_AMBULATORY_CARE_PROVIDER_SITE_OTHER): Payer: Medicare Other | Admitting: Diagnostic Neuroimaging

## 2013-04-24 ENCOUNTER — Encounter (INDEPENDENT_AMBULATORY_CARE_PROVIDER_SITE_OTHER): Payer: Self-pay

## 2013-04-24 ENCOUNTER — Telehealth: Payer: Self-pay | Admitting: Diagnostic Neuroimaging

## 2013-04-24 VITALS — BP 138/82 | HR 67 | Temp 98.0°F | Ht 70.0 in | Wt 200.5 lb

## 2013-04-24 DIAGNOSIS — IMO0002 Reserved for concepts with insufficient information to code with codable children: Secondary | ICD-10-CM

## 2013-04-24 DIAGNOSIS — M545 Low back pain, unspecified: Secondary | ICD-10-CM

## 2013-04-24 DIAGNOSIS — M5416 Radiculopathy, lumbar region: Secondary | ICD-10-CM

## 2013-04-24 DIAGNOSIS — G609 Hereditary and idiopathic neuropathy, unspecified: Secondary | ICD-10-CM

## 2013-04-24 MED ORDER — GABAPENTIN 800 MG PO TABS: 800.0000 mg | ORAL_TABLET | Freq: Three times a day (TID) | ORAL | Status: DC

## 2013-04-24 NOTE — Progress Notes (Signed)
[facility]  PATIENT: [name] DOB: 10/02/1932  REFERRING CLINICIAN: Hewitt HISTORY FROM: patient  REASON FOR VISIT: new consult   HISTORICAL  CHIEF COMPLAINT:  Chief Complaint  Patient presents with  . Peripheral Neuropathy    HISTORY OF PRESENT ILLNESS:   NEW HPI (04/24/13): 80 year old right-handed male here for evaluation of bilateral foot tingling and pain. Symptoms started in 2013. I previously saw patient for memory problem. He also came back to me for EMG nerve conduction study which was normal. Patient also has chronic low back pain, status post 4 lumbar spine fusion surgeries with hardware and cages. He does not recall the date of his last spine surgery. He thinks it was 5-10 years ago. Patient denies any pain radiating from his low back into his legs. He has significant pain with sitting or standing for long periods of time. Patient has been taking gabapentin 600 mg 3 times a day with mild relief of pain.  PRIOR HPI (09/03/10; prior consult for memory loss): 80 year old right-handed male with history of hypertension, hypercholesterolemia, coronary artery disease, here for evaluation of memory loss.  Patient is alone for this evaluation.  Patient reports difficulty recalling details of remote events, such as his [employer] service in the 1960's.  He remembers the general events and years, but when he gets online to chat with his friends, they are able to recall specific details of their days together, but he cannot recall them.  Other examples he gives are not being able to remember the details of how his son used to get to school, how his wife used to get to work.  He is unable to reverse specific dates of recent medical appointments or dates of specific surgeries (dating back 30 years).    He does not have significant short-term recall problems.  He is able to perform his activities of daily living independently. He still drives, and handles finances,  and participates in household chores.  He denies any stress, depression or anxiety.  REVIEW OF SYSTEMS: Full 14 system review of systems performed and notable only for hearing loss memory loss painful feet.  ALLERGIES: No Known Allergies  HOME MEDICATIONS: Outpatient Prescriptions Prior to Visit  Medication Sig Dispense Refill  . allopurinol (ZYLOPRIM) 100 MG tablet Take 1 tablet (100 mg total) by mouth daily.  90 tablet  3  . amLODipine (NORVASC) 10 MG tablet TAKE 1 TABLET DAILY  90 tablet  0  . atorvastatin (LIPITOR) 40 MG tablet TAKE 1 TABLET DAILY  90 tablet  1  . colchicine 0.6 MG tablet Take 1 tablet (0.6 mg total) by mouth 2 (two) times daily.  180 tablet  2  . lisinopril (PRINIVIL,ZESTRIL) 20 MG tablet Take 1 tablet (20 mg total) by mouth daily.  90 tablet  2  . nitroGLYCERIN (NITROSTAT) 0.4 MG SL tablet Place 1 tablet (0.4 mg total) under the tongue every 5 (five) minutes as needed. For chest pain--may repeat three times.  25 tablet  8  . Potassium Chloride Crys CR (POTASSIUM CHLORIDE CRYS ER PO) Take 30 mg by mouth.      Marland Kitchen RAPAFLO 8 MG CAPS capsule TAKE 1 CAPSULE DAILY WITH BREAKFAST  90 capsule  2  . traMADol (ULTRAM) 50 MG tablet Take 1 tablet (50 mg total) by mouth every 6 (six) hours as needed.  90 tablet  3  . XARELTO 20 MG TABS tablet TAKE 1 TABLET DAILY  90 tablet  2  . gabapentin (NEURONTIN) 100  MG capsule Take 100 mg by mouth 3 (three) times daily.        No facility-administered medications prior to visit.    PAST MEDICAL HISTORY: Past Medical History  Diagnosis Date  . ASCVD (arteriosclerotic cardiovascular disease)   . Decreased left ventricular function   . Ventricular hypokinesis     severe/basilar & mid-inferior and inferoseptal segments  . Scarring     cardio/inferior, inferolateral & inferoapical w/o ischemia  . Cerebrovascular disease 01/2004    right carotid bruit, minimal plaque on [place]   . Sick sinus syndrome 08/2004    Sinus Bradycardia & AFib  .  Chronic anticoagulation   . History of tobacco abuse     20 pack years; d/c in 1983  . Other and unspecified hyperlipidemia   . Gout, unspecified   . Unspecified essential hypertension   . Vertigo   . Osteoarthritis   . Pacemaker 08/2004    dual-chamber Guidant  . CAD (coronary artery disease)   . HTN (hypertension)     PAST SURGICAL HISTORY: Past Surgical History  Procedure Laterality Date  . Transurethral resection of prostate    . Anterior fusion cervical spine  2006    LS [2003]; prior cervical spine procedure w/implantation of hardware[[[  . Cholecystectomy    . Coronary artery bypass graft  1983    three times  . Appendectomy    . Knee surgery      Left, twice  . Pacemaker placement  08/2004    Guidant implant  . Neck surgery    . Back surgery    . Bladder surgery      FAMILY HISTORY: Family History  Problem Relation Age of Onset  . Cancer Neg Hx   . Diabetes Neg Hx   . Early death Neg Hx   . Heart disease Neg Hx   . Hyperlipidemia Neg Hx   . Hypertension Neg Hx   . Kidney disease Neg Hx   . Stroke Neg Hx   . Kidney failure Mother     SOCIAL HISTORY:  History   Social History  . Marital Status: Divorced    Spouse Name: Rise Paganini    Number of Children: 1  . Years of Education: 38yr  Occupational History  . Retired     [employer]   Social History Main Topics  . Smoking status: Former Smoker    Quit date: 11/18/1976  . Smokeless tobacco: Never Used  . Alcohol Use: No  . Drug Use: No  . Sexual Activity: Yes   Other Topics Concern  . Not on file   Social History Narrative   Retired [employer]   Divorced-Remarried   Regular Exercise-yes   Patient lives at home with his spouse.   Caffeine Use: 2 cups daily                 PHYSICAL EXAM  Filed Vitals:   04/24/13 0934  BP: 138/82  Pulse: 67  Temp: 98 F (36.7 C)  TempSrc: Oral  Height: 5' 10"  (1.778 m)  Weight: 200 lb 8 oz (90.946 kg)    Not recorded     Body mass index is 28.77 kg/(m^2).  GENERAL EXAM: Patient is in no distress; well developed, nourished and groomed; neck is supple  CARDIOVASCULAR: Regular rate and rhythm, no murmurs, no carotid bruits  NEUROLOGIC: MENTAL STATUS: awake, alert, oriented to person, place and time, recent and remote memory intact, normal attention and concentration, language fluent, comprehension intact,  naming intact, fund of knowledge appropriate CRANIAL NERVE: no papilledema on fundoscopic exam, pupils equal and reactive to light, visual fields full to confrontation, extraocular muscles intact, no nystagmus, facial sensation and strength symmetric, hearing intact, palate elevates symmetrically, uvula midline, shoulder shrug symmetric, tongue midline. MOTOR: normal bulk and tone, full strength in the BUE, BLE; SLOW MOVEMENTS IN BLE.  SENSORY: normal and symmetric to light touch and proprioception; RIGHT TOE VIB 3 SEC; LEFT TOE VIB ABSENT. DECR PP AT LEFT TOES. COORDINATION: finger-nose-finger, fine finger movements, heel-shin normal REFLEXES: deep tendon reflexes present and symmetric; ABSENT AT ANKLES. MUTE TOES GAIT/STATION: ANTALGIC, SLIGHTLY STOOPED GAIT. DIFF WITH HEEL GAIT. ABLE TO TOE WALK. DIFF WITH TANDEM AND ROMBERG TESTING.     DIAGNOSTIC DATA (LABS, IMAGING, TESTING) - I reviewed patient records, labs, notes, testing and imaging myself where available.  Lab Results  Component Value Date   WBC 6.6 01/02/2013   HGB 14.5 01/02/2013   HCT 43.5 01/02/2013   MCV 94.2 01/02/2013   PLT 162.0 01/02/2013      Component Value Date/Time   NA 140 02/24/2013 0955   K 4.0 02/24/2013 0955   CL 105 02/24/2013 0955   CO2 26 02/24/2013 0955   GLUCOSE 100* 02/24/2013 0955   BUN 16 02/24/2013 0955   CREATININE 1.1 02/24/2013 0955   CALCIUM 9.9 02/24/2013 0955   PROT 7.4 01/02/2013 0838   ALBUMIN 4.4 01/02/2013 0838   AST 33 01/02/2013 0838   ALT 37 01/02/2013 0838   ALKPHOS 82 01/02/2013 0838    BILITOT 0.9 01/02/2013 0838   GFRNONAA 78.27 12/10/2009 0938   GFRAA  Value: >60        The eGFR has been calculated using the MDRD equation. This calculation has not been validated in all clinical situations. eGFR's persistently <60 mL/min signify possible Chronic Kidney Disease. 11/26/2008 1030   Lab Results  Component Value Date   CHOL 130 02/24/2013   HDL 56.30 02/24/2013   LDLCALC 62 02/24/2013   LDLDIRECT 120.7 07/29/2010   TRIG 57.0 02/24/2013   CHOLHDL 2 02/24/2013   Lab Results  Component Value Date   HGBA1C 6.0 02/24/2013   No results found for this basename: VITAMINB12   Lab Results  Component Value Date   TSH 1.17 08/17/2011    I reviewed images myself and agree with interpretation. -VRP  05/23/04 MRI LUMBAR  1. Progression of spinal stenosis at L2-3 and L3-4 due to progression of disc protrusion and facet arthropathy.  2. Pedicle screw fusion at L4, L5 and S1 without spinal stenosis. There is biforaminal narrowing at L5-S1 due to spondylosis.  10/26/11 EMG/NCS - normal   ASSESSMENT AND PLAN  80 y.o. year old male here with painful feet. Could be related to underlying small fiber neuropathy versus lumbar radiculopathy.  PLAN: - neuropathy workup - patient not interested in more surgery at this time; will hold off on repeat MRI lumbar spine - increase gabapentin to 860m TID  Orders Placed This Encounter  Procedures  . Neuropathy Panel  . Vitamin B12  . TSH   Return in about 6 months (around 10/24/2013).    VPenni Bombard MD [id number] 124:40AM Certified in Neurology, Neurophysiology and Neuroimaging  [facility] [address] [place] [phone]

## 2013-04-24 NOTE — Patient Instructions (Signed)
Increase gabapentin to 800mg  three times a day.

## 2013-04-24 NOTE — Telephone Encounter (Signed)
It appears the Rx was sent to [facility] at last OV.  [facility]  I spoke with the patient.  He is aware.

## 2013-04-25 LAB — NEUROPATHY PANEL
A/G Ratio: 1.3 (ref 0.7–2.0)
Albumin ELP: 3.9 g/dL (ref 3.2–5.6)
Alpha 1: 0.3 g/dL (ref 0.1–0.4)
Alpha 2: 0.8 g/dL (ref 0.4–1.2)
Angio Convert Enzyme: <14 U/L — ABNORMAL LOW (ref 14–82)
Anti Nuclear Antibody(ANA): NEGATIVE
BETA: 1.1 g/dL (ref 0.6–1.3)
GAMMA GLOBULIN: 0.8 g/dL (ref 0.5–1.6)
GLOBULIN, TOTAL: 3 g/dL (ref 2.0–4.5)
Rhuematoid fact SerPl-aCnc: 8.5 IU/mL (ref 0.0–13.9)
SED RATE: 30 mm/h (ref 0–30)
TSH: 3.06 u[IU]/mL (ref 0.450–4.500)
Total Protein: 6.9 g/dL (ref 6.0–8.5)
Vit D, 25-Hydroxy: 40 ng/mL (ref 30.0–100.0)
Vitamin B-12: 851 pg/mL (ref 211–946)

## 2013-04-27 ENCOUNTER — Ambulatory Visit (INDEPENDENT_AMBULATORY_CARE_PROVIDER_SITE_OTHER): Payer: Medicare Other | Admitting: Internal Medicine

## 2013-04-27 ENCOUNTER — Encounter: Payer: Self-pay | Admitting: Internal Medicine

## 2013-04-27 VITALS — BP 147/84 | HR 64 | Ht 70.0 in | Wt 190.0 lb

## 2013-04-27 DIAGNOSIS — Z950 Presence of cardiac pacemaker: Secondary | ICD-10-CM

## 2013-04-27 DIAGNOSIS — I4891 Unspecified atrial fibrillation: Secondary | ICD-10-CM

## 2013-04-27 DIAGNOSIS — I495 Sick sinus syndrome: Secondary | ICD-10-CM

## 2013-04-27 DIAGNOSIS — I10 Essential (primary) hypertension: Secondary | ICD-10-CM

## 2013-04-27 LAB — MDC_IDC_ENUM_SESS_TYPE_INCLINIC
Brady Statistic RA Percent Paced: 0 %
Date Time Interrogation Session: 20150416040000
Implantable Pulse Generator Serial Number: 124041
Lead Channel Impedance Value: 550 Ohm
Lead Channel Pacing Threshold Amplitude: 1 V
Lead Channel Pacing Threshold Pulse Width: 0.6 ms
Lead Channel Setting Pacing Amplitude: 2.4 V
Lead Channel Setting Pacing Pulse Width: 0.6 ms
Lead Channel Setting Sensing Sensitivity: 2.5 mV
MDC IDC MSMT LEADCHNL RV SENSING INTR AMPL: 8.8 mV
MDC IDC STAT BRADY RV PERCENT PACED: 95 %

## 2013-04-27 NOTE — Assessment & Plan Note (Signed)
His ventricular rate is well controlled. He is actually maintaining NSR.

## 2013-04-27 NOTE — Progress Notes (Signed)
HPI [name] returns today for followup. He is a very pleasant 80 year old man with a history of chronic atrial fibrillation, symptomatic bradycardia, status post permanent pacemaker insertion. He also has dyslipidemia and hypertension.  He denies weight loss. No syncope. Minimal or no palpitations. No syncope. His energy level is good. No Known Allergies   Current Outpatient Prescriptions  Medication Sig Dispense Refill  . allopurinol (ZYLOPRIM) 100 MG tablet Take 1 tablet (100 mg total) by mouth daily.  90 tablet  3  . amLODipine (NORVASC) 10 MG tablet TAKE 1 TABLET DAILY  90 tablet  0  . atorvastatin (LIPITOR) 40 MG tablet TAKE 1 TABLET DAILY  90 tablet  1  . colchicine 0.6 MG tablet Take 1 tablet (0.6 mg total) by mouth 2 (two) times daily.  180 tablet  2  . gabapentin (NEURONTIN) 800 MG tablet Take 400 mg by mouth 2 (two) times daily. Taking two 400 mg tablets twice a day.      . lisinopril (PRINIVIL,ZESTRIL) 20 MG tablet Take 1 tablet (20 mg total) by mouth daily.  90 tablet  2  . nitroGLYCERIN (NITROSTAT) 0.4 MG SL tablet Place 1 tablet (0.4 mg total) under the tongue every 5 (five) minutes as needed. For chest pain--may repeat three times.  25 tablet  8  . Potassium Chloride Crys CR (POTASSIUM CHLORIDE CRYS ER PO) Take 30 mg by mouth.      Marland Kitchen. RAPAFLO 8 MG CAPS capsule TAKE 1 CAPSULE DAILY WITH BREAKFAST  90 capsule  2  . traMADol (ULTRAM) 50 MG tablet Take 1 tablet (50 mg total) by mouth every 6 (six) hours as needed.  90 tablet  3  . XARELTO 20 MG TABS tablet TAKE 1 TABLET DAILY  90 tablet  2   No current facility-administered medications for this visit.     Past Medical History  Diagnosis Date  . ASCVD (arteriosclerotic cardiovascular disease)   . Decreased left ventricular function   . Ventricular hypokinesis     severe/basilar & mid-inferior and inferoseptal segments  . Scarring     cardio/inferior, inferolateral & inferoapical w/o ischemia  . Cerebrovascular disease  01/2004    right carotid bruit, minimal plaque on US   . Sick sinus syndrome 08/2004    Sinus Bradycardia & AFib  . Chronic anticoagulation   . History of tobacco abuse     20 pack years; d/c in 1983  . Other and unspecified hyperlipidemia   . Gout, unspecified   . Unspecified essential hypertension   . Vertigo   . Osteoarthritis   . Pacemaker 08/2004    dual-chamber Guidant  . CAD (coronary artery disease)   . HTN (hypertension)     ROS:   All systems reviewed and negative except as noted in the HPI.   Past Surgical History  Procedure Laterality Date  . Transurethral resection of prostate    . Anterior fusion cervical spine  2006    LS [2003]; prior cervical spine procedure w/implantation of hardware[[[  . Cholecystectomy    . Coronary artery bypass graft  1983    three times  . Appendectomy    . Knee surgery      Left, twice  . Pacemaker placement  08/2004    Guidant implant  . Neck surgery    . Back surgery    . Bladder surgery       Family History  Problem Relation Age of Onset  . Cancer Neg Hx   . Diabetes  Neg Hx   . Early death Neg Hx   . Heart disease Neg Hx   . Hyperlipidemia Neg Hx   . Hypertension Neg Hx   . Kidney disease Neg Hx   . Stroke Neg Hx   . Kidney failure Mother      History   Social History  . Marital Status: Divorced    Spouse Name: Meriam SpragueBeverly    Number of Children: 1  . Years of Education: 2735yr   Occupational History  . Retired     [employer]   Social History Main Topics  . Smoking status: Former Smoker    Quit date: 11/18/1976  . Smokeless tobacco: Never Used  . Alcohol Use: No  . Drug Use: No  . Sexual Activity: Yes   Other Topics Concern  . Not on file   Social History Narrative   Retired [employer]   Divorced-Remarried   Regular Exercise-yes   Patient lives at home with his spouse.   Caffeine Use: 2 cups daily                 BP 147/84  Pulse 64  Ht 5\' 10"  (1.778 m)  Wt 190 lb  (86.183 kg)  BMI 27.26 kg/m2  Physical Exam:  Well appearing 80 yo man, NAD HEENT: Unremarkable Neck:  6 cm JVD, no thyromegally Back:  No CVA tenderness Lungs:  Clear with no wheezes, rales, or rhonchi. HEART:  Regular rate rhythm, grade 2/6 systolic murmurs, no rubs, no clicks Abd:  soft, positive bowel sounds, no organomegally, no rebound, no guarding Ext:  2 plus pulses, no edema, no cyanosis, no clubbing Skin:  No rashes no nodules Neuro:  CN II through XII intact, motor grossly intact  DEVICE  Normal device function.  See PaceArt for details. Approaching ERI  Assess/Plan:

## 2013-04-27 NOTE — Assessment & Plan Note (Signed)
His [place] [facility] DDD PM is working normally. Will recheck in several months. 

## 2013-04-27 NOTE — Assessment & Plan Note (Signed)
His blood pressure is slightly elevated. Will follow.

## 2013-04-27 NOTE — Patient Instructions (Signed)
Your physician recommends that you schedule a follow-up appointment in: 3 months with device clinic  

## 2013-05-03 ENCOUNTER — Encounter: Payer: Self-pay | Admitting: Family Medicine

## 2013-05-08 ENCOUNTER — Ambulatory Visit (INDEPENDENT_AMBULATORY_CARE_PROVIDER_SITE_OTHER): Payer: Medicare Other | Admitting: Internal Medicine

## 2013-05-08 ENCOUNTER — Other Ambulatory Visit (INDEPENDENT_AMBULATORY_CARE_PROVIDER_SITE_OTHER): Payer: Medicare Other

## 2013-05-08 ENCOUNTER — Encounter: Payer: Self-pay | Admitting: Internal Medicine

## 2013-05-08 ENCOUNTER — Encounter (INDEPENDENT_AMBULATORY_CARE_PROVIDER_SITE_OTHER): Payer: Medicare Other | Admitting: Ophthalmology

## 2013-05-08 VITALS — BP 150/78 | HR 70 | Temp 97.0°F | Resp 16 | Ht 70.0 in | Wt 204.0 lb

## 2013-05-08 DIAGNOSIS — I4891 Unspecified atrial fibrillation: Secondary | ICD-10-CM

## 2013-05-08 DIAGNOSIS — I509 Heart failure, unspecified: Secondary | ICD-10-CM

## 2013-05-08 DIAGNOSIS — R609 Edema, unspecified: Secondary | ICD-10-CM

## 2013-05-08 DIAGNOSIS — I10 Essential (primary) hypertension: Secondary | ICD-10-CM

## 2013-05-08 DIAGNOSIS — H3560 Retinal hemorrhage, unspecified eye: Secondary | ICD-10-CM

## 2013-05-08 DIAGNOSIS — H35039 Hypertensive retinopathy, unspecified eye: Secondary | ICD-10-CM

## 2013-05-08 DIAGNOSIS — H43819 Vitreous degeneration, unspecified eye: Secondary | ICD-10-CM

## 2013-05-08 LAB — URINALYSIS, ROUTINE W REFLEX MICROSCOPIC
Bilirubin Urine: NEGATIVE
Ketones, ur: NEGATIVE
LEUKOCYTES UA: NEGATIVE
Nitrite: NEGATIVE
SPECIFIC GRAVITY, URINE: 1.015 (ref 1.000–1.030)
Total Protein, Urine: NEGATIVE
URINE GLUCOSE: NEGATIVE
UROBILINOGEN UA: 0.2 (ref 0.0–1.0)
pH: 6 (ref 5.0–8.0)

## 2013-05-08 LAB — TSH: TSH: 1.19 u[IU]/mL (ref 0.35–5.50)

## 2013-05-08 LAB — COMPREHENSIVE METABOLIC PANEL
ALT: 18 U/L (ref 0–53)
AST: 30 U/L (ref 0–37)
Albumin: 4.1 g/dL (ref 3.5–5.2)
Alkaline Phosphatase: 71 U/L (ref 39–117)
BILIRUBIN TOTAL: 1.6 mg/dL — AB (ref 0.3–1.2)
BUN: 17 mg/dL (ref 6–23)
CO2: 29 meq/L (ref 19–32)
Calcium: 9.6 mg/dL (ref 8.4–10.5)
Chloride: 104 mEq/L (ref 96–112)
Creatinine, Ser: 0.9 mg/dL (ref 0.4–1.5)
GFR: 83.37 mL/min (ref >60.00)
Glucose, Bld: 124 mg/dL — ABNORMAL HIGH (ref 70–99)
Potassium: 4.1 mEq/L (ref 3.5–5.1)
SODIUM: 141 meq/L (ref 135–145)
Total Protein: 7.1 g/dL (ref 6.0–8.3)

## 2013-05-08 LAB — CBC WITH DIFFERENTIAL/PLATELET
BASOS ABS: 0 10*3/uL (ref 0.0–0.1)
Basophils Relative: 0.2 % (ref 0.0–3.0)
Eosinophils Absolute: 0.1 10*3/uL (ref 0.0–0.7)
Eosinophils Relative: 1.3 % (ref 0.0–5.0)
HEMATOCRIT: 37.2 % — AB (ref 39.0–52.0)
HEMOGLOBIN: 12.2 g/dL — AB (ref 13.0–17.0)
LYMPHS ABS: 0.9 10*3/uL (ref 0.7–4.0)
Lymphocytes Relative: 13.6 % (ref 12.0–46.0)
MCHC: 32.8 g/dL (ref 30.0–36.0)
MCV: 97.9 fl (ref 78.0–100.0)
MONO ABS: 0.6 10*3/uL (ref 0.1–1.0)
Monocytes Relative: 9.4 % (ref 3.0–12.0)
Neutro Abs: 5.1 10*3/uL (ref 1.4–7.7)
Neutrophils Relative %: 75.5 % (ref 43.0–77.0)
PLATELETS: 175 10*3/uL (ref 150.0–400.0)
RBC: 3.8 Mil/uL — ABNORMAL LOW (ref 4.22–5.81)
RDW: 15 % — AB (ref 11.5–14.6)
WBC: 6.8 10*3/uL (ref 4.5–10.5)

## 2013-05-08 LAB — CARDIAC PANEL
CK-MB: 3.5 ng/mL (ref 0.3–4.0)
Relative Index: 2 calc (ref 0.0–2.5)
Total CK: 177 U/L (ref 7–232)

## 2013-05-08 LAB — TROPONIN I: TROPONIN I: 0.07 ng/mL — AB (ref <0.06)

## 2013-05-08 MED ORDER — FUROSEMIDE 40 MG PO TABS: 40.0000 mg | ORAL_TABLET | Freq: Every day | ORAL | Status: AC

## 2013-05-08 NOTE — Assessment & Plan Note (Addendum)
Stop gabapentin Will check labs to look for secondary causes and start lasix I have asked him to see cardiology to be evaluated for CHF Will check for ischemia today

## 2013-05-08 NOTE — Progress Notes (Signed)
Pre visit review using our clinic review tool, if applicable. No additional management support is needed unless otherwise documented below in the visit note. 

## 2013-05-08 NOTE — Progress Notes (Signed)
Subjective:    Patient ID: [name], male    DOB: 1932/01/13, 80 y.o.   [name]: [id number]  Hypertension This is a chronic problem. The current episode started more than 1 year ago. The problem has been gradually worsening since onset. The problem is uncontrolled. Associated symptoms include peripheral edema (he has developed leg edema over the last 5 days) and shortness of breath. Pertinent negatives include no anxiety, blurred vision, chest pain, headaches, malaise/fatigue, neck pain, orthopnea, palpitations, PND or sweats. Past treatments include ACE inhibitors and calcium channel blockers. The current treatment provides moderate improvement. There are no compliance problems.  Hypertensive end-organ damage includes heart failure and left ventricular hypertrophy.      Review of Systems  Constitutional: Negative.  Negative for fever, chills, malaise/fatigue, diaphoresis, appetite change and fatigue.  HENT: Negative.   Eyes: Negative.  Negative for blurred vision.  Respiratory: Positive for shortness of breath. Negative for apnea, cough, choking, chest tightness, wheezing and stridor.   Cardiovascular: Negative.  Negative for chest pain, palpitations, orthopnea, leg swelling and PND.  Gastrointestinal: Negative.  Negative for nausea, vomiting, abdominal pain, diarrhea, constipation and blood in stool.  Endocrine: Negative.   Genitourinary: Negative.   Musculoskeletal: Negative.  Negative for arthralgias, neck pain and neck stiffness.  Skin: Negative.   Allergic/Immunologic: Negative.   Neurological: Negative.  Negative for dizziness, tremors, syncope, speech difficulty, weakness, light-headedness and headaches.  Hematological: Negative.  Negative for adenopathy. Does not bruise/bleed easily.  Psychiatric/Behavioral: Negative.        Objective:   Physical Exam  Vitals reviewed. Constitutional: He is oriented to person, place, and time. He appears well-developed and well-nourished.  No distress.  HENT:  Head: Normocephalic and atraumatic.  Mouth/Throat: Oropharynx is clear and moist. No oropharyngeal exudate.  Eyes: Conjunctivae are normal. Right eye exhibits no discharge. Left eye exhibits no discharge. No scleral icterus.  Neck: Normal range of motion. Neck supple. JVD present. No tracheal deviation present. No thyromegaly present.  Cardiovascular: Normal rate, regular rhythm, normal heart sounds and intact distal pulses.   Occasional extrasystoles are present. Exam reveals no gallop, no S3, no S4 and no friction rub.   No murmur heard. Pulses:      Carotid pulses are 1+ on the right side, and 1+ on the left side.      Radial pulses are 1+ on the right side, and 1+ on the left side.       Femoral pulses are 1+ on the right side, and 1+ on the left side.      Popliteal pulses are 1+ on the right side, and 1+ on the left side.       Dorsalis pedis pulses are 1+ on the right side, and 1+ on the left side.       Posterior tibial pulses are 1+ on the right side, and 1+ on the left side.  Pulmonary/Chest: Effort normal. No accessory muscle usage or stridor. Not tachypneic. No respiratory distress. He has no decreased breath sounds. He has no wheezes. He has no rhonchi. He has rales in the right lower field and the left lower field.  Abdominal: Soft. Bowel sounds are normal. He exhibits no distension and no mass. There is no tenderness. There is no rebound and no guarding.  Musculoskeletal: Normal range of motion. He exhibits edema (2+ pitting edema in BLE). He exhibits no tenderness.  Lymphadenopathy:    He has no cervical adenopathy.  Neurological: He is oriented to person, place,  and time.  Skin: Skin is warm and dry. No rash noted. He is not diaphoretic. No erythema. No pallor.  Psychiatric: He has a normal mood and affect. His behavior is normal. Judgment and thought content normal.     Lab Results  Component Value Date   WBC 6.6 01/02/2013   HGB 14.5 01/02/2013    HCT 43.5 01/02/2013   PLT 162.0 01/02/2013   GLUCOSE 100* 02/24/2013   CHOL 130 02/24/2013   TRIG 57.0 02/24/2013   HDL 56.30 02/24/2013   LDLDIRECT 120.7 07/29/2010   LDLCALC 62 02/24/2013   ALT 37 01/02/2013   AST 33 01/02/2013   NA 140 02/24/2013   K 4.0 02/24/2013   CL 105 02/24/2013   CREATININE 1.1 02/24/2013   BUN 16 02/24/2013   CO2 26 02/24/2013   TSH 3.060 04/24/2013   PSA 0.77 08/17/2011   INR 3.8 10/30/2011   HGBA1C 6.0 02/24/2013       Assessment & Plan:

## 2013-05-08 NOTE — Patient Instructions (Signed)
Edema Edema is an abnormal build-up of fluids in tissues. Because this is partly dependent on gravity (water flows to the lowest place), it is more common in the legs and thighs (lower extremities). It is also common in the looser tissues, like around the eyes. Painless swelling of the feet and ankles is common and increases as a person ages. It may affect both legs and may include the calves or even thighs. When squeezed, the fluid may move out of the affected area and may leave a dent for a few moments. CAUSES   Prolonged standing or sitting in one place for extended periods of time. Movement helps pump tissue fluid into the veins, and absence of movement prevents this, resulting in edema.  Varicose veins. The valves in the veins do not work as well as they should. This causes fluid to leak into the tissues.  Fluid and salt overload.  Injury, burn, or surgery to the leg, ankle, or foot, may damage veins and allow fluid to leak out.  Sunburn damages vessels. Leaky vessels allow fluid to go out into the sunburned tissues.  Allergies (from insect bites or stings, medications or chemicals) cause swelling by allowing vessels to become leaky.  Protein in the blood helps keep fluid in your vessels. Low protein, as in malnutrition, allows fluid to leak out.  Hormonal changes, including pregnancy and menstruation, cause fluid retention. This fluid may leak out of vessels and cause edema.  Medications that cause fluid retention. Examples are sex hormones, blood pressure medications, steroid treatment, or anti-depressants.  Some illnesses cause edema, especially heart failure, kidney disease, or liver disease.  Surgery that cuts veins or lymph nodes, such as surgery done for the heart or for breast cancer, may result in edema. DIAGNOSIS  Your caregiver is usually easily able to determine what is causing your swelling (edema) by simply asking what is wrong (getting a history) and examining you (doing  a physical). Sometimes x-rays, EKG (electrocardiogram or heart tracing), and blood work may be done to evaluate for underlying medical illness. TREATMENT  General treatment includes:  Leg elevation (or elevation of the affected body part).  Restriction of fluid intake.  Prevention of fluid overload.  Compression of the affected body part. Compression with elastic bandages or support stockings squeezes the tissues, preventing fluid from entering and forcing it back into the blood vessels.  Diuretics (also called water pills or fluid pills) pull fluid out of your body in the form of increased urination. These are effective in reducing the swelling, but can have side effects and must be used only under your caregiver's supervision. Diuretics are appropriate only for some types of edema. The specific treatment can be directed at any underlying causes discovered. Heart, liver, or kidney disease should be treated appropriately. HOME CARE INSTRUCTIONS   Elevate the legs (or affected body part) above the level of the heart, while lying down.  Avoid sitting or standing still for prolonged periods of time.  Avoid putting anything directly under the knees when lying down, and do not wear constricting clothing or garters on the upper legs.  Exercising the legs causes the fluid to work back into the veins and lymphatic channels. This may help the swelling go down.  The pressure applied by elastic bandages or support stockings can help reduce ankle swelling.  A low-salt diet may help reduce fluid retention and decrease the ankle swelling.  Take any medications exactly as prescribed. SEEK MEDICAL CARE IF:  Your edema is   not responding to recommended treatments. SEEK IMMEDIATE MEDICAL CARE IF:   You develop shortness of breath or chest pain.  You cannot breathe when you lay down; or if, while lying down, you have to get up and go to the window to get your breath.  You are having increasing  swelling without relief from treatment.  You develop a fever over 102 F (38.9 C).  You develop pain or redness in the areas that are swollen.  Tell your caregiver right away if you have gained 03 lb/1.4 kg in 1 day or 05 lb/2.3 kg in a week. MAKE SURE YOU:   Understand these instructions.  Will watch your condition.  Will get help right away if you are not doing well or get worse. Document Released: 12/29/2004 Document Revised: 06/30/2011 Document Reviewed: 08/17/2007 [employer] Patient Information 2014 [employer].  

## 2013-05-08 NOTE — Assessment & Plan Note (Signed)
The EKG is unchanged from prior EKG with paced rhythm and LBBB I will check his labs today to look for ischemia, the software blocked my order for a BNP Will also check his labs to look for secondary causes like thyroid disease, renal failure, etc I offered to have him admitted for diuresis but he is not willing to do that so will start po lasix I have asked him to see his cardiologist within the next week as well

## 2013-05-08 NOTE — Addendum Note (Signed)
Addended by: Etta GrandchildJONES, Kellen Hover L on: 05/08/2013 03:48 PM   Modules accepted: Orders, Medications

## 2013-05-09 ENCOUNTER — Encounter: Payer: Self-pay | Admitting: Internal Medicine

## 2013-05-09 ENCOUNTER — Other Ambulatory Visit: Payer: Self-pay | Admitting: Internal Medicine

## 2013-05-11 ENCOUNTER — Encounter: Payer: Self-pay | Admitting: Cardiology

## 2013-05-11 ENCOUNTER — Ambulatory Visit (INDEPENDENT_AMBULATORY_CARE_PROVIDER_SITE_OTHER): Payer: Medicare Other | Admitting: Cardiology

## 2013-05-11 VITALS — BP 136/87 | HR 107 | Ht 70.0 in | Wt 191.4 lb

## 2013-05-11 DIAGNOSIS — I495 Sick sinus syndrome: Secondary | ICD-10-CM

## 2013-05-11 DIAGNOSIS — Z79899 Other long term (current) drug therapy: Secondary | ICD-10-CM

## 2013-05-11 DIAGNOSIS — I2510 Atherosclerotic heart disease of native coronary artery without angina pectoris: Secondary | ICD-10-CM

## 2013-05-11 DIAGNOSIS — I509 Heart failure, unspecified: Secondary | ICD-10-CM

## 2013-05-11 DIAGNOSIS — R0602 Shortness of breath: Secondary | ICD-10-CM

## 2013-05-11 LAB — BASIC METABOLIC PANEL
BUN: 25 mg/dL — ABNORMAL HIGH (ref 6–23)
CHLORIDE: 101 meq/L (ref 96–112)
CO2: 28 mEq/L (ref 19–32)
Calcium: 9.7 mg/dL (ref 8.4–10.5)
Creatinine, Ser: 1.2 mg/dL (ref 0.4–1.5)
GFR: 63.34 mL/min (ref >60.00)
Glucose, Bld: 115 mg/dL — ABNORMAL HIGH (ref 70–99)
Potassium: 3.7 mEq/L (ref 3.5–5.1)
Sodium: 139 mEq/L (ref 135–145)

## 2013-05-11 LAB — BRAIN NATRIURETIC PEPTIDE: Pro B Natriuretic peptide (BNP): 97 pg/mL (ref 0.0–100.0)

## 2013-05-11 NOTE — Progress Notes (Signed)
HPI The patient returns for followup of his known coronary disease and edema with dyspnea.  He saw Dr. Yetta BarreJones the other day as he had had about a 13 pound weight gain over several weeks and was having abdominal distention and increased lower extremity edema. He was started on diuretics and is back down 13 pounds and does feel better. However, he is not at baseline. He feels weaker. He's having more dyspnea with activity such as walking a moderate distance on level ground although he is not describing PND or orthopnea. He's not having any chest pressure, neck or arm discomfort. He's not having any palpitations, presyncope or syncope. He is having no weight gain or edema.  He has had improvement in his abdominal distention. He has not had any increased salt.   No Known Allergies  Current Outpatient Prescriptions  Medication Sig Dispense Refill  . allopurinol (ZYLOPRIM) 100 MG tablet Take 1 tablet (100 mg total) by mouth daily.  90 tablet  3  . amLODipine (NORVASC) 10 MG tablet TAKE 1 TABLET DAILY  90 tablet  0  . atorvastatin (LIPITOR) 40 MG tablet TAKE 1 TABLET DAILY  90 tablet  1  . COLCRYS 0.6 MG tablet TAKE 1 TABLET TWICE A DAY  180 tablet  1  . furosemide (LASIX) 40 MG tablet Take 1 tablet (40 mg total) by mouth daily.  60 tablet  5  . lisinopril (PRINIVIL,ZESTRIL) 20 MG tablet Take 1 tablet (20 mg total) by mouth daily.  90 tablet  2  . nitroGLYCERIN (NITROSTAT) 0.4 MG SL tablet Place 1 tablet (0.4 mg total) under the tongue every 5 (five) minutes as needed. For chest pain--may repeat three times.  25 tablet  8  . Potassium Chloride Crys CR (POTASSIUM CHLORIDE CRYS ER PO) Take 30 mg by mouth.      Marland Kitchen. RAPAFLO 8 MG CAPS capsule TAKE 1 CAPSULE DAILY WITH BREAKFAST  90 capsule  2  . traMADol (ULTRAM) 50 MG tablet Take 1 tablet (50 mg total) by mouth every 6 (six) hours as needed.  90 tablet  3  . XARELTO 20 MG TABS tablet TAKE 1 TABLET DAILY  90 tablet  2   No current facility-administered  medications for this visit.    Past Medical History  Diagnosis Date  . ASCVD (arteriosclerotic cardiovascular disease)   . Decreased left ventricular function   . Ventricular hypokinesis     severe/basilar & mid-inferior and inferoseptal segments  . Scarring     cardio/inferior, inferolateral & inferoapical w/o ischemia  . Cerebrovascular disease 01/2004    right carotid bruit, minimal plaque on US   . Sick sinus syndrome 08/2004    Sinus Bradycardia & AFib  . Chronic anticoagulation   . History of tobacco abuse     20 pack years; d/c in 1983  . Other and unspecified hyperlipidemia   . Gout, unspecified   . Unspecified essential hypertension   . Vertigo   . Osteoarthritis   . Pacemaker 08/2004    dual-chamber Guidant  . CAD (coronary artery disease)   . HTN (hypertension)     Past Surgical History  Procedure Laterality Date  . Transurethral resection of prostate    . Anterior fusion cervical spine  2006    LS [2003]; prior cervical spine procedure w/implantation of hardware[[[  . Cholecystectomy    . Coronary artery bypass graft  1983    three times  . Appendectomy    . Knee surgery  Left, twice  . Pacemaker placement  08/2004    Guidant implant  . Neck surgery    . Back surgery    . Bladder surgery      ROS:  As stated in the HPI and negative for all other systems.  PHYSICAL EXAM BP 136/87  Pulse 107  Ht 5\' 10"  (1.778 m)  Wt 191 lb 6.4 oz (86.818 kg)  BMI 27.46 kg/m2 GENERAL:  Well appearing NECK:  No jugular venous distention, waveform within normal limits, carotid upstroke brisk and symmetric, no bruits, no thyromegaly LYMPHATICS:  No cervical, inguinal adenopathy LUNGS:  Clear to auscultation bilaterally CHEST:  Well healed sternotomy scar, pacemaker pocket HEART:  PMI not displaced or sustained,S1 and S2 within normal limits, no S3, no clicks, no rubs, no murmurs ABD:  Flat, positive bowel sounds normal in frequency in pitch, no bruits, no  rebound, no guarding, no midline pulsatile mass, no hepatomegaly, no splenomegaly EXT:  2 plus pulses throughout, no edema, no cyanosis no clubbing SKIN:  Psoriatic rash,  no nodules  EKG:  Atrial fibrillation, rate 63with ventricular pacing 100% capture.  PVC  05/08/13   ASSESSMENT AND PLAN  DYSPNEA - The patient's swelling and dyspnea seem to be an exacerbation of heart failure. He's much improved with diuretics and I will continue these but check a basic metabolic profile. I will check an echocardiogram. He also have evaluation as below.  His last stress perfusion study with 2010.  CAD -  He does have 78 year old bypass grafts. It is time for him to have stress testing to followup on this. With his paced rhythm he would need imaging and so he will have a [employer] Myoview.I don't think he would be a walk on a treadmill.  HYPERTENSION -  The blood pressure is at target. No change in medications is indicated. We will continue with therapeutic lifestyle changes (TLC).   HYPERLIPIDEMIA -  He will continue on the meds as listed.   Atrial fibrillation -  The patient is tolerating his Xarelto.  No change in therapy is indicated.

## 2013-05-11 NOTE — Patient Instructions (Addendum)
The current medical regimen is effective;  continue present plan and medications.  Please have blood work today (BMP and BNP)  Your physician has requested that you have an echocardiogram. Echocardiography is a painless test that uses sound waves to create images of your heart. It provides your doctor with information about the size and shape of your heart and how well your heart's chambers and valves are working. This procedure takes approximately one hour. There are no restrictions for this procedure.  Your physician has requested that you have a lexiscan myoview. For further information please visit [email or web address]. Please follow instruction sheet, as given.  Follow up in approximately 1 month after testing.

## 2013-05-12 ENCOUNTER — Ambulatory Visit: Payer: Medicare Other | Admitting: Internal Medicine

## 2013-05-12 ENCOUNTER — Ambulatory Visit (HOSPITAL_COMMUNITY): Payer: Medicare Other | Attending: Internal Medicine | Admitting: Cardiology

## 2013-05-12 DIAGNOSIS — I4891 Unspecified atrial fibrillation: Secondary | ICD-10-CM | POA: Insufficient documentation

## 2013-05-12 DIAGNOSIS — Z87891 Personal history of nicotine dependence: Secondary | ICD-10-CM | POA: Insufficient documentation

## 2013-05-12 DIAGNOSIS — R0609 Other forms of dyspnea: Secondary | ICD-10-CM | POA: Insufficient documentation

## 2013-05-12 DIAGNOSIS — I2510 Atherosclerotic heart disease of native coronary artery without angina pectoris: Secondary | ICD-10-CM | POA: Insufficient documentation

## 2013-05-12 DIAGNOSIS — R9439 Abnormal result of other cardiovascular function study: Secondary | ICD-10-CM | POA: Insufficient documentation

## 2013-05-12 DIAGNOSIS — E785 Hyperlipidemia, unspecified: Secondary | ICD-10-CM | POA: Insufficient documentation

## 2013-05-12 DIAGNOSIS — R0989 Other specified symptoms and signs involving the circulatory and respiratory systems: Secondary | ICD-10-CM | POA: Insufficient documentation

## 2013-05-12 DIAGNOSIS — I10 Essential (primary) hypertension: Secondary | ICD-10-CM | POA: Insufficient documentation

## 2013-05-12 DIAGNOSIS — I509 Heart failure, unspecified: Secondary | ICD-10-CM | POA: Insufficient documentation

## 2013-05-12 DIAGNOSIS — R0602 Shortness of breath: Secondary | ICD-10-CM

## 2013-05-12 NOTE — Progress Notes (Signed)
Echo performed. 

## 2013-05-15 ENCOUNTER — Other Ambulatory Visit (INDEPENDENT_AMBULATORY_CARE_PROVIDER_SITE_OTHER): Payer: Medicare Other

## 2013-05-15 ENCOUNTER — Ambulatory Visit (INDEPENDENT_AMBULATORY_CARE_PROVIDER_SITE_OTHER): Payer: Medicare Other | Admitting: Internal Medicine

## 2013-05-15 ENCOUNTER — Encounter: Payer: Self-pay | Admitting: Internal Medicine

## 2013-05-15 VITALS — BP 136/84 | HR 107 | Temp 98.0°F | Resp 18 | Wt 188.0 lb

## 2013-05-15 DIAGNOSIS — I2510 Atherosclerotic heart disease of native coronary artery without angina pectoris: Secondary | ICD-10-CM

## 2013-05-15 DIAGNOSIS — I10 Essential (primary) hypertension: Secondary | ICD-10-CM

## 2013-05-15 DIAGNOSIS — D510 Vitamin B12 deficiency anemia due to intrinsic factor deficiency: Secondary | ICD-10-CM

## 2013-05-15 DIAGNOSIS — I509 Heart failure, unspecified: Secondary | ICD-10-CM

## 2013-05-15 LAB — CBC WITH DIFFERENTIAL/PLATELET
BASOS ABS: 0 10*3/uL (ref 0.0–0.1)
Basophils Relative: 0.3 % (ref 0.0–3.0)
EOS ABS: 0.1 10*3/uL (ref 0.0–0.7)
Eosinophils Relative: 1.1 % (ref 0.0–5.0)
HCT: 43.9 % (ref 39.0–52.0)
Hemoglobin: 14.6 g/dL (ref 13.0–17.0)
LYMPHS PCT: 18.5 % (ref 12.0–46.0)
Lymphs Abs: 1.3 10*3/uL (ref 0.7–4.0)
MCHC: 33.3 g/dL (ref 30.0–36.0)
MCV: 97.1 fl (ref 78.0–100.0)
MONO ABS: 0.5 10*3/uL (ref 0.1–1.0)
Monocytes Relative: 6.8 % (ref 3.0–12.0)
NEUTROS ABS: 5.1 10*3/uL (ref 1.4–7.7)
Neutrophils Relative %: 73.3 % (ref 43.0–77.0)
Platelets: 259 10*3/uL (ref 150.0–400.0)
RBC: 4.52 Mil/uL (ref 4.22–5.81)
RDW: 14.6 % (ref 11.5–14.6)
WBC: 6.9 10*3/uL (ref 4.5–10.5)

## 2013-05-15 LAB — IBC PANEL
Iron: 141 ug/dL (ref 42–165)
Saturation Ratios: 34.1 % (ref 20.0–50.0)
Transferrin: 295.6 mg/dL (ref 212.0–360.0)

## 2013-05-15 LAB — BASIC METABOLIC PANEL
BUN: 26 mg/dL — ABNORMAL HIGH (ref 6–23)
CALCIUM: 10.1 mg/dL (ref 8.4–10.5)
CO2: 30 meq/L (ref 19–32)
CREATININE: 1.5 mg/dL (ref 0.4–1.5)
Chloride: 102 mEq/L (ref 96–112)
GFR: 48.39 mL/min — ABNORMAL LOW (ref >60.00)
Glucose, Bld: 153 mg/dL — ABNORMAL HIGH (ref 70–99)
Potassium: 4.4 mEq/L (ref 3.5–5.1)
Sodium: 142 mEq/L (ref 135–145)

## 2013-05-15 LAB — FERRITIN: Ferritin: 169.7 ng/mL (ref 22.0–322.0)

## 2013-05-15 LAB — MAGNESIUM: Magnesium: 2.1 mg/dL (ref 1.5–2.5)

## 2013-05-15 NOTE — Patient Instructions (Signed)
Heart Failure °Heart failure is a condition in which the heart has trouble pumping blood. This means your heart does not pump blood efficiently for your body to work well. In some cases of heart failure, fluid may back up into your lungs or you may have swelling (edema) in your lower legs. Heart failure is usually a long-term (chronic) condition. It is important for you to take good care of yourself and follow your caregiver's treatment plan. °CAUSES  °Some health conditions can cause heart failure. Those health conditions include: °· High blood pressure (hypertension) causes the heart muscle to work harder than normal. When pressure in the blood vessels is high, the heart needs to pump (contract) with more force in order to circulate blood throughout the body. High blood pressure eventually causes the heart to become stiff and weak. °· Coronary artery disease (CAD) is the buildup of cholesterol and fat (plaque) in the arteries of the heart. The blockage in the arteries deprives the heart muscle of oxygen and blood. This can cause chest pain and may lead to a heart attack. High blood pressure can also contribute to CAD. °· Heart attack (myocardial infarction) occurs when 1 or more arteries in the heart become blocked. The loss of oxygen damages the muscle tissue of the heart. When this happens, part of the heart muscle dies. The injured tissue does not contract as well and weakens the heart's ability to pump blood. °· Abnormal heart valves can cause heart failure when the heart valves do not open and close properly. This makes the heart muscle pump harder to keep the blood flowing. °· Heart muscle disease (cardiomyopathy or myocarditis) is damage to the heart muscle from a variety of causes. These can include drug or alcohol abuse, infections, or unknown reasons. These can increase the risk of heart failure. °· Lung disease makes the heart work harder because the lungs do not work properly. This can cause a strain  on the heart, leading it to fail. °· Diabetes increases the risk of heart failure. High blood sugar contributes to high fat (lipid) levels in the blood. Diabetes can also cause slow damage to tiny blood vessels that carry important nutrients to the heart muscle. When the heart does not get enough oxygen and food, it can cause the heart to become weak and stiff. This leads to a heart that does not contract efficiently. °· Other conditions can contribute to heart failure. These include abnormal heart rhythms, thyroid problems, and low blood counts (anemia). °Certain unhealthy behaviors can increase the risk of heart failure. Those unhealthy behaviors include: °· Being overweight. °· Smoking or chewing tobacco. °· Eating foods high in fat and cholesterol. °· Abusing illicit drugs or alcohol. °· Lacking physical activity. °SYMPTOMS  °Heart failure symptoms may vary and can be hard to detect. Symptoms may include: °· Shortness of breath with activity, such as climbing stairs. °· Persistent cough. °· Swelling of the feet, ankles, legs, or abdomen. °· Unexplained weight gain. °· Difficulty breathing when lying flat (orthopnea). °· Waking from sleep because of the need to sit up and get more air. °· Rapid heartbeat. °· Fatigue and loss of energy. °· Feeling lightheaded, dizzy, or close to fainting. °· Loss of appetite. °· Nausea. °· Increased urination during the night (nocturia). °DIAGNOSIS  °A diagnosis of heart failure is based on your history, symptoms, physical examination, and diagnostic tests. °Diagnostic tests for heart failure may include: °· Echocardiography. °· Electrocardiography. °· Chest X-ray. °· Blood tests. °· Exercise   stress test. °· Cardiac angiography. °· Radionuclide scans. °TREATMENT  °Treatment is aimed at managing the symptoms of heart failure. Medicines, behavioral changes, or surgical intervention may be necessary to treat heart failure. °· Medicines to help treat heart failure may  include: °· Angiotensin-converting enzyme (ACE) inhibitors. This type of medicine blocks the effects of a blood protein called angiotensin-converting enzyme. ACE inhibitors relax (dilate) the blood vessels and help lower blood pressure. °· Angiotensin receptor blockers. This type of medicine blocks the actions of a blood protein called angiotensin. Angiotensin receptor blockers dilate the blood vessels and help lower blood pressure. °· Water pills (diuretics). Diuretics cause the kidneys to remove salt and water from the blood. The extra fluid is removed through urination. This loss of extra fluid lowers the volume of blood the heart pumps. °· Beta blockers. These prevent the heart from beating too fast and improve heart muscle strength. °· Digitalis. This increases the force of the heartbeat. °· Healthy behavior changes include: °· Obtaining and maintaining a healthy weight. °· Stopping smoking or chewing tobacco. °· Eating heart healthy foods. °· Limiting or avoiding alcohol. °· Stopping illicit drug use. °· Physical activity as directed by your caregiver. °· Surgical treatment for heart failure may include: °· A procedure to open blocked arteries, repair damaged heart valves, or remove damaged heart muscle tissue. °· A pacemaker to improve heart muscle function and control certain abnormal heart rhythms. °· An internal cardioverter defibrillator to treat certain serious abnormal heart rhythms. °· A left ventricular assist device to assist the pumping ability of the heart. °HOME CARE INSTRUCTIONS  °· Take your medicine as directed by your caregiver. Medicines are important in reducing the workload of your heart, slowing the progression of heart failure, and improving your symptoms. °· Do not stop taking your medicine unless directed by your caregiver. °· Do not skip any dose of medicine. °· Refill your prescriptions before you run out of medicine. Your medicines are needed every day. °· Take over-the-counter  medicine only as directed by your caregiver or pharmacist. °· Engage in moderate physical activity if directed by your caregiver. Moderate physical activity can benefit some people. The elderly and people with severe heart failure should consult with a caregiver for physical activity recommendations. °· Eat heart healthy foods. Food choices should be free of trans fat and low in saturated fat, cholesterol, and salt (sodium). Healthy choices include fresh or frozen fruits and vegetables, fish, lean meats, legumes, fat-free or low-fat dairy products, and whole grain or high fiber foods. Talk to a dietitian to learn more about heart healthy foods. °· Limit sodium if directed by your caregiver. Sodium restriction may reduce symptoms of heart failure in some people. Talk to a dietitian to learn more about heart healthy seasonings. °· Use healthy cooking methods. Healthy cooking methods include roasting, grilling, broiling, baking, poaching, steaming, or stir-frying. Talk to a dietitian to learn more about healthy cooking methods. °· Limit fluids if directed by your caregiver. Fluid restriction may reduce symptoms of heart failure in some people. °· Weigh yourself every day. Daily weights are important in the early recognition of excess fluid. You should weigh yourself every morning after you urinate and before you eat breakfast. Wear the same amount of clothing each time you weigh yourself. Record your daily weight. Provide your caregiver with your weight record. °· Monitor and record your blood pressure if directed by your caregiver. °· Check your pulse if directed by your caregiver. °· Lose weight if directed   by your caregiver. Weight loss may reduce symptoms of heart failure in some people. °· Stop smoking or chewing tobacco. Nicotine makes your heart work harder by causing your blood vessels to constrict. Do not use nicotine gum or patches before talking to your caregiver. °· Schedule and attend follow-up visits as  directed by your caregiver. It is important to keep all your appointments. °· Limit alcohol intake to no more than 1 drink per day for nonpregnant women and 2 drinks per day for men. Drinking more than that is harmful to your heart. Tell your caregiver if you drink alcohol several times a week. Talk with your caregiver about whether alcohol is safe for you. If your heart has already been damaged by alcohol or you have severe heart failure, drinking alcohol should be stopped completely. °· Stop illicit drug use. °· Stay up-to-date with immunizations. It is especially important to prevent respiratory infections through current pneumococcal and influenza immunizations. °· Manage other health conditions such as hypertension, diabetes, thyroid disease, or abnormal heart rhythms as directed by your caregiver. °· Learn to manage stress. °· Plan rest periods when fatigued. °· Learn strategies to manage high temperatures. If the weather is extremely hot: °· Avoid vigorous physical activity. °· Use air conditioning or fans or seek a cooler location. °· Avoid caffeine and alcohol. °· Wear loose-fitting, lightweight, and light-colored clothing. °· Learn strategies to manage cold temperatures. If the weather is extremely cold: °· Avoid vigorous physical activity. °· Layer clothes. °· Wear mittens or gloves, a hat, and a scarf when going outside. °· Avoid alcohol. °· Obtain ongoing education and support as needed. °· Participate or seek rehabilitation as needed to maintain or improve independence and quality of life. °SEEK MEDICAL CARE IF:  °· Your weight increases by 03 lb/1.4 kg in 1 day or 05 lb/2.3 kg in a week. °· You have increasing shortness of breath that is unusual for you. °· You are unable to participate in your usual physical activities. °· You tire easily. °· You cough more than normal, especially with physical activity. °· You have any or more swelling in areas such as your hands, feet, ankles, or abdomen. °· You  are unable to sleep because it is hard to breathe. °· You feel like your heart is beating fast (palpitations). °· You become dizzy or lightheaded upon standing up. °SEEK IMMEDIATE MEDICAL CARE IF:  °· You have difficulty breathing. °· There is a change in mental status such as decreased alertness or difficulty with concentration. °· You have a pain or discomfort in your chest. °· You have an episode of fainting (syncope). °MAKE SURE YOU:  °· Understand these instructions. °· Will watch your condition. °· Will get help right away if you are not doing well or get worse. °Document Released: 12/29/2004 Document Revised: 04/25/2012 Document Reviewed: 01/21/2012 °[employer]® Patient Information ©2014 [employer]. ° °

## 2013-05-15 NOTE — Progress Notes (Signed)
   Subjective:    Patient ID: [name], male    DOB: Jul 02, 1932, 80 y.o.   [name]: [id number]  Congestive Heart Failure Presents for follow-up visit. The disease course has been improving. Associated symptoms include fatigue. Pertinent negatives include no abdominal pain, chest pain, chest pressure, claudication, edema, muscle weakness, near-syncope, nocturia, orthopnea, palpitations, paroxysmal nocturnal dyspnea, shortness of breath or unexpected weight change. The symptoms have been resolved. Past treatments include ACE inhibitors and salt and fluid restriction. The treatment provided significant relief.      Review of Systems  Constitutional: Positive for fatigue. Negative for fever, chills, diaphoresis, activity change, appetite change and unexpected weight change.  HENT: Negative.   Eyes: Negative.   Respiratory: Negative.  Negative for cough, choking, chest tightness, shortness of breath, wheezing and stridor.   Cardiovascular: Negative.  Negative for chest pain, palpitations, claudication, leg swelling and near-syncope.  Gastrointestinal: Negative.  Negative for nausea, vomiting, abdominal pain, diarrhea, constipation and blood in stool.  Endocrine: Negative.   Genitourinary: Negative.  Negative for nocturia.  Musculoskeletal: Negative.  Negative for muscle weakness.  Skin: Negative.   Allergic/Immunologic: Negative.   Neurological: Positive for weakness (he feels weak all over). Negative for dizziness, tremors, seizures, syncope, facial asymmetry, speech difficulty, light-headedness, numbness and headaches.  Hematological: Negative.  Negative for adenopathy. Does not bruise/bleed easily.  Psychiatric/Behavioral: Negative.        Objective:   Physical Exam  Vitals reviewed. Constitutional: He is oriented to person, place, and time. He appears well-developed and well-nourished. No distress.  HENT:  Head: Normocephalic and atraumatic.  Mouth/Throat: Oropharynx is clear and  moist. No oropharyngeal exudate.  Eyes: Conjunctivae are normal. Right eye exhibits no discharge. Left eye exhibits no discharge. No scleral icterus.  Neck: Normal range of motion. Neck supple. No JVD present. No tracheal deviation present. No thyromegaly present.  Cardiovascular: Normal rate, regular rhythm, normal heart sounds and intact distal pulses.  Exam reveals no gallop and no friction rub.   No murmur heard. Pulmonary/Chest: Effort normal and breath sounds normal. No stridor. No respiratory distress. He has no wheezes. He has no rales. He exhibits no tenderness.  Abdominal: Soft. Bowel sounds are normal. He exhibits no distension and no mass. There is no tenderness. There is no rebound and no guarding.  Musculoskeletal: Normal range of motion. He exhibits no edema and no tenderness.  Lymphadenopathy:    He has no cervical adenopathy.  Neurological: He is oriented to person, place, and time.  Skin: Skin is warm and dry. No rash noted. He is not diaphoretic. No erythema. No pallor.  Psychiatric: He has a normal mood and affect. His behavior is normal. Judgment and thought content normal.     Lab Results  Component Value Date   WBC 6.8 05/08/2013   HGB 12.2* 05/08/2013   HCT 37.2* 05/08/2013   PLT 175.0 05/08/2013   GLUCOSE 115* 05/11/2013   CHOL 130 02/24/2013   TRIG 57.0 02/24/2013   HDL 56.30 02/24/2013   LDLDIRECT 120.7 07/29/2010   LDLCALC 62 02/24/2013   ALT 18 05/08/2013   AST 30 05/08/2013   NA 139 05/11/2013   K 3.7 05/11/2013   CL 101 05/11/2013   CREATININE 1.2 05/11/2013   BUN 25* 05/11/2013   CO2 28 05/11/2013   TSH 1.19 05/08/2013   PSA 0.77 08/17/2011   INR 3.8 10/30/2011   HGBA1C 6.0 02/24/2013       Assessment & Plan:

## 2013-05-15 NOTE — Progress Notes (Signed)
Pre-visit discussion using our clinic review tool, as applicable. No additional management support is needed unless otherwise documented below in the visit note.  

## 2013-05-15 NOTE — Assessment & Plan Note (Signed)
The software blocked my order for B12 and folate I will recheck his CBC today and will get iron levels as well

## 2013-05-16 NOTE — Assessment & Plan Note (Signed)
His BP is well controlled Lytes and renal function are stable 

## 2013-05-16 NOTE — Assessment & Plan Note (Signed)
Marked improvement His complaints of fatigue and weakness and not specific and not alarming to me, this is to be expected after an episode of CHF exacerbation His lytes and renal function are stable today

## 2013-05-17 ENCOUNTER — Other Ambulatory Visit: Payer: Self-pay | Admitting: *Deleted

## 2013-05-17 MED ORDER — AMLODIPINE BESYLATE 10 MG PO TABS: ORAL_TABLET | ORAL | Status: DC

## 2013-05-19 ENCOUNTER — Encounter: Payer: Self-pay | Admitting: *Deleted

## 2013-05-19 ENCOUNTER — Encounter (HOSPITAL_COMMUNITY): Payer: Self-pay | Admitting: Pharmacy Technician

## 2013-05-19 ENCOUNTER — Other Ambulatory Visit (INDEPENDENT_AMBULATORY_CARE_PROVIDER_SITE_OTHER): Payer: Medicare Other

## 2013-05-19 ENCOUNTER — Telehealth: Payer: Self-pay | Admitting: *Deleted

## 2013-05-19 DIAGNOSIS — I2510 Atherosclerotic heart disease of native coronary artery without angina pectoris: Secondary | ICD-10-CM

## 2013-05-19 LAB — BASIC METABOLIC PANEL
BUN: 27 mg/dL — ABNORMAL HIGH (ref 6–23)
CHLORIDE: 100 meq/L (ref 96–112)
CO2: 32 meq/L (ref 19–32)
Calcium: 10 mg/dL (ref 8.4–10.5)
Creatinine, Ser: 1.3 mg/dL (ref 0.4–1.5)
GFR: 55.17 mL/min — ABNORMAL LOW (ref >60.00)
GLUCOSE: 139 mg/dL — AB (ref 70–99)
POTASSIUM: 3.8 meq/L (ref 3.5–5.1)
SODIUM: 141 meq/L (ref 135–145)

## 2013-05-19 LAB — CBC
HEMATOCRIT: 45.6 % (ref 39.0–52.0)
Hemoglobin: 15.1 g/dL (ref 13.0–17.0)
MCHC: 33 g/dL (ref 30.0–36.0)
MCV: 97.2 fl (ref 78.0–100.0)
PLATELETS: 244 10*3/uL (ref 150.0–400.0)
RBC: 4.69 Mil/uL (ref 4.22–5.81)
RDW: 14.6 % (ref 11.5–15.5)
WBC: 8.2 10*3/uL (ref 4.0–10.5)

## 2013-05-19 LAB — PROTIME-INR
INR: 2.1 ratio — ABNORMAL HIGH (ref 0.8–1.0)
Prothrombin Time: 23.1 s — ABNORMAL HIGH (ref 9.6–13.1)

## 2013-05-19 NOTE — Telephone Encounter (Signed)
Spoke with pt regarding catheterization recommended by Dr. Antoine PocheHochrein. Pt would like to do as soon as possible.  Cath scheduled for May 22, 2013 with Dr. Eldridge DaceVaranasi at 7:30 AM. Pt will come in for lab work this AM.  I verbally went over all instructions with pt and will leave copy at front desk for him to pick up when here for lab work today.  He is aware to stop Xarelto 2 days prior to procedure.  I have cancelled stress test scheduled for May 29, 2013 but kept scheduled appt with Dr. Antoine PocheHochrein for Jun 08, 2013 for follow up.

## 2013-05-22 ENCOUNTER — Encounter (HOSPITAL_COMMUNITY)
Admission: RE | Disposition: A | Discharge status: Home / Self Care | Payer: Self-pay | Source: Ambulatory Visit | Attending: Interventional Cardiology

## 2013-05-22 ENCOUNTER — Ambulatory Visit (HOSPITAL_COMMUNITY)
Admission: RE | Admit: 2013-05-22 | Discharge: 2013-05-22 | Disposition: A | Discharge status: Home / Self Care | Payer: Medicare Other | Source: Ambulatory Visit | Attending: Interventional Cardiology | Admitting: Interventional Cardiology

## 2013-05-22 ENCOUNTER — Other Ambulatory Visit: Payer: Self-pay | Admitting: Cardiology

## 2013-05-22 DIAGNOSIS — Z7901 Long term (current) use of anticoagulants: Secondary | ICD-10-CM | POA: Insufficient documentation

## 2013-05-22 DIAGNOSIS — I25810 Atherosclerosis of coronary artery bypass graft(s) without angina pectoris: Secondary | ICD-10-CM | POA: Insufficient documentation

## 2013-05-22 DIAGNOSIS — I255 Ischemic cardiomyopathy: Secondary | ICD-10-CM

## 2013-05-22 DIAGNOSIS — I4891 Unspecified atrial fibrillation: Secondary | ICD-10-CM | POA: Insufficient documentation

## 2013-05-22 DIAGNOSIS — Z950 Presence of cardiac pacemaker: Secondary | ICD-10-CM | POA: Insufficient documentation

## 2013-05-22 DIAGNOSIS — M109 Gout, unspecified: Secondary | ICD-10-CM | POA: Insufficient documentation

## 2013-05-22 DIAGNOSIS — I10 Essential (primary) hypertension: Secondary | ICD-10-CM | POA: Insufficient documentation

## 2013-05-22 DIAGNOSIS — I2510 Atherosclerotic heart disease of native coronary artery without angina pectoris: Secondary | ICD-10-CM

## 2013-05-22 DIAGNOSIS — E785 Hyperlipidemia, unspecified: Secondary | ICD-10-CM | POA: Insufficient documentation

## 2013-05-22 DIAGNOSIS — I509 Heart failure, unspecified: Secondary | ICD-10-CM

## 2013-05-22 DIAGNOSIS — Z87891 Personal history of nicotine dependence: Secondary | ICD-10-CM | POA: Insufficient documentation

## 2013-05-22 HISTORY — PX: LEFT HEART CATHETERIZATION WITH CORONARY/GRAFT ANGIOGRAM: SHX5450

## 2013-05-22 LAB — PROTIME-INR
INR: 1.01 (ref 0.00–1.49)
PROTHROMBIN TIME: 13.1 s (ref 11.6–15.2)

## 2013-05-22 SURGERY — LEFT HEART CATHETERIZATION WITH CORONARY/GRAFT ANGIOGRAM
Anesthesia: LOCAL

## 2013-05-22 MED ORDER — RIVAROXABAN 20 MG PO TABS: 20.0000 mg | ORAL_TABLET | Freq: Every day | ORAL | Status: AC

## 2013-05-22 MED ORDER — NITROGLYCERIN 0.2 MG/ML ON CALL CATH LAB
INTRAVENOUS | Status: AC
  Filled 2013-05-22: qty 1

## 2013-05-22 MED ORDER — ATROPINE SULFATE 0.1 MG/ML IJ SOLN
INTRAMUSCULAR | Status: AC
  Filled 2013-05-22: qty 10

## 2013-05-22 MED ORDER — ASPIRIN 81 MG PO CHEW
81.0000 mg | CHEWABLE_TABLET | ORAL | Status: AC
  Administered 2013-05-22: 81 mg via ORAL
  Filled 2013-05-22: qty 1

## 2013-05-22 MED ORDER — FENTANYL CITRATE 0.05 MG/ML IJ SOLN
INTRAMUSCULAR | Status: AC
  Filled 2013-05-22: qty 2

## 2013-05-22 MED ORDER — HEPARIN (PORCINE) IN NACL 2-0.9 UNIT/ML-% IJ SOLN
INTRAMUSCULAR | Status: AC
  Filled 2013-05-22: qty 1000

## 2013-05-22 MED ORDER — SODIUM CHLORIDE 0.9 % IV SOLN: 1.0000 mL/kg/h | INTRAVENOUS | Status: AC

## 2013-05-22 MED ORDER — MIDAZOLAM HCL 2 MG/2ML IJ SOLN
INTRAMUSCULAR | Status: AC
  Filled 2013-05-22: qty 2

## 2013-05-22 MED ORDER — SODIUM CHLORIDE 0.9 % IV SOLN: 250.0000 mL | INTRAVENOUS | Status: DC | PRN

## 2013-05-22 MED ORDER — MORPHINE SULFATE 2 MG/ML IJ SOLN
INTRAMUSCULAR | Status: AC
  Filled 2013-05-22: qty 1

## 2013-05-22 MED ORDER — MORPHINE SULFATE 10 MG/ML IJ SOLN
2.0000 mg | INTRAMUSCULAR | Status: DC | PRN
  Administered 2013-05-22: 2 mg via INTRAVENOUS

## 2013-05-22 MED ORDER — LIDOCAINE HCL (PF) 1 % IJ SOLN
INTRAMUSCULAR | Status: AC
  Filled 2013-05-22: qty 30

## 2013-05-22 MED ORDER — SODIUM CHLORIDE 0.9 % IJ SOLN: 3.0000 mL | Freq: Two times a day (BID) | INTRAMUSCULAR | Status: DC

## 2013-05-22 MED ORDER — SODIUM CHLORIDE 0.9 % IJ SOLN: 3.0000 mL | INTRAMUSCULAR | Status: DC | PRN

## 2013-05-22 MED ORDER — SODIUM CHLORIDE 0.9 % IV SOLN
INTRAVENOUS | Status: DC
  Administered 2013-05-22: 07:00:00 via INTRAVENOUS

## 2013-05-22 NOTE — Interval H&P Note (Signed)
Cath Lab Visit (complete for each Cath Lab visit)  Clinical Evaluation Leading to the Procedure:   ACS: no  Non-ACS:    Anginal Classification: CCS II  Anti-ischemic medical therapy: Minimal Therapy (1 class of medications)  Non-Invasive Test Results: High-risk stress test findings: cardiac mortality >3%/year  Prior CABG: Previous CABG      History and Physical Interval Note:  05/22/2013 7:44 AM  [name]  has presented today for surgery, with the diagnosis of decrease ef  The various methods of treatment have been discussed with the patient and family. After consideration of risks, benefits and other options for treatment, the patient has consented to  Procedure(s): LEFT HEART CATHETERIZATION WITH CORONARY/GRAFT ANGIOGRAM (N/A) as a surgical intervention .  The patient's history has been reviewed, patient examined, no change in status, stable for surgery.  I have reviewed the patient's chart and labs.  Questions were answered to the patient's satisfaction.     Corky CraftsJayadeep S Jahzier Villalon

## 2013-05-22 NOTE — H&P (View-Only) (Signed)
HPI The patient returns for followup of his known coronary disease and edema with dyspnea.  He saw Dr. Yetta BarreJones the other day as he had had about a 13 pound weight gain over several weeks and was having abdominal distention and increased lower extremity edema. He was started on diuretics and is back down 13 pounds and does feel better. However, he is not at baseline. He feels weaker. He's having more dyspnea with activity such as walking a moderate distance on level ground although he is not describing PND or orthopnea. He's not having any chest pressure, neck or arm discomfort. He's not having any palpitations, presyncope or syncope. He is having no weight gain or edema.  He has had improvement in his abdominal distention. He has not had any increased salt.   No Known Allergies  Current Outpatient Prescriptions  Medication Sig Dispense Refill  . allopurinol (ZYLOPRIM) 100 MG tablet Take 1 tablet (100 mg total) by mouth daily.  90 tablet  3  . amLODipine (NORVASC) 10 MG tablet TAKE 1 TABLET DAILY  90 tablet  0  . atorvastatin (LIPITOR) 40 MG tablet TAKE 1 TABLET DAILY  90 tablet  1  . COLCRYS 0.6 MG tablet TAKE 1 TABLET TWICE A DAY  180 tablet  1  . furosemide (LASIX) 40 MG tablet Take 1 tablet (40 mg total) by mouth daily.  60 tablet  5  . lisinopril (PRINIVIL,ZESTRIL) 20 MG tablet Take 1 tablet (20 mg total) by mouth daily.  90 tablet  2  . nitroGLYCERIN (NITROSTAT) 0.4 MG SL tablet Place 1 tablet (0.4 mg total) under the tongue every 5 (five) minutes as needed. For chest pain--may repeat three times.  25 tablet  8  . Potassium Chloride Crys CR (POTASSIUM CHLORIDE CRYS ER PO) Take 30 mg by mouth.      Marland Kitchen. RAPAFLO 8 MG CAPS capsule TAKE 1 CAPSULE DAILY WITH BREAKFAST  90 capsule  2  . traMADol (ULTRAM) 50 MG tablet Take 1 tablet (50 mg total) by mouth every 6 (six) hours as needed.  90 tablet  3  . XARELTO 20 MG TABS tablet TAKE 1 TABLET DAILY  90 tablet  2   No current facility-administered  medications for this visit.    Past Medical History  Diagnosis Date  . ASCVD (arteriosclerotic cardiovascular disease)   . Decreased left ventricular function   . Ventricular hypokinesis     severe/basilar & mid-inferior and inferoseptal segments  . Scarring     cardio/inferior, inferolateral & inferoapical w/o ischemia  . Cerebrovascular disease 01/2004    right carotid bruit, minimal plaque on US   . Sick sinus syndrome 08/2004    Sinus Bradycardia & AFib  . Chronic anticoagulation   . History of tobacco abuse     20 pack years; d/c in 1983  . Other and unspecified hyperlipidemia   . Gout, unspecified   . Unspecified essential hypertension   . Vertigo   . Osteoarthritis   . Pacemaker 08/2004    dual-chamber Guidant  . CAD (coronary artery disease)   . HTN (hypertension)     Past Surgical History  Procedure Laterality Date  . Transurethral resection of prostate    . Anterior fusion cervical spine  2006    LS [2003]; prior cervical spine procedure w/implantation of hardware[[[  . Cholecystectomy    . Coronary artery bypass graft  1983    three times  . Appendectomy    . Knee surgery  Left, twice  . Pacemaker placement  08/2004    Guidant implant  . Neck surgery    . Back surgery    . Bladder surgery      ROS:  As stated in the HPI and negative for all other systems.  PHYSICAL EXAM BP 136/87  Pulse 107  Ht 5\' 10"  (1.778 m)  Wt 191 lb 6.4 oz (86.818 kg)  BMI 27.46 kg/m2 GENERAL:  Well appearing NECK:  No jugular venous distention, waveform within normal limits, carotid upstroke brisk and symmetric, no bruits, no thyromegaly LYMPHATICS:  No cervical, inguinal adenopathy LUNGS:  Clear to auscultation bilaterally CHEST:  Well healed sternotomy scar, pacemaker pocket HEART:  PMI not displaced or sustained,S1 and S2 within normal limits, no S3, no clicks, no rubs, no murmurs ABD:  Flat, positive bowel sounds normal in frequency in pitch, no bruits, no  rebound, no guarding, no midline pulsatile mass, no hepatomegaly, no splenomegaly EXT:  2 plus pulses throughout, no edema, no cyanosis no clubbing SKIN:  Psoriatic rash,  no nodules  EKG:  Atrial fibrillation, rate 63with ventricular pacing 100% capture.  PVC  05/08/13   ASSESSMENT AND PLAN  DYSPNEA - The patient's swelling and dyspnea seem to be an exacerbation of heart failure. He's much improved with diuretics and I will continue these but check a basic metabolic profile. I will check an echocardiogram. He also have evaluation as below.  His last stress perfusion study with 2010.  CAD -  He does have 78 year old bypass grafts. It is time for him to have stress testing to followup on this. With his paced rhythm he would need imaging and so he will have a [employer].I don't think he would be a walk on a treadmill.  HYPERTENSION -  The blood pressure is at target. No change in medications is indicated. We will continue with therapeutic lifestyle changes (TLC).   HYPERLIPIDEMIA -  He will continue on the meds as listed.   Atrial fibrillation -  The patient is tolerating his Xarelto.  No change in therapy is indicated.

## 2013-05-22 NOTE — Discharge Instructions (Signed)
No lifting more than 10 lbs. For a week.    Angiography, Care After Refer to this sheet in the next few weeks. These instructions provide you with information on caring for yourself after your procedure. Your health care provider may also give you more specific instructions. Your treatment has been planned according to current medical practices, but problems sometimes occur. Call your health care provider if you have any problems or questions after your procedure.  WHAT TO EXPECT AFTER THE PROCEDURE After your procedure, it is typical to have the following sensations:  Minor discomfort or tenderness and a small bump at the catheter insertion site. The bump should usually decrease in size and tenderness within 1 to 2 weeks.  Any bruising will usually fade within 2 to 4 weeks. HOME CARE INSTRUCTIONS   You may need to keep taking blood thinners if they were prescribed for you. Only take over-the-counter or prescription medicines for pain, fever, or discomfort as directed by your health care provider.  Do not apply powder or lotion to the site.  Do not sit in a bathtub, swimming pool, or whirlpool for 5 to 7 days.  You may shower 24 hours after the procedure. Remove the bandage (dressing) and gently wash the site with plain soap and water. Gently pat the site dry.  Inspect the site at least twice daily.  Limit your activity for the first 48 hours. Do not bend, squat, or lift anything over 20 lb (9 kg) or as directed by your health care provider.  Do not drive home if you are discharged the day of the procedure. Have someone else drive you. Follow instructions about when you can drive or return to work. SEEK MEDICAL CARE IF:  You get lightheaded when standing up.  You have drainage (other than a small amount of blood on the dressing).  You have chills.  You have a fever.  You have redness, warmth, swelling, or pain at the insertion site. SEEK IMMEDIATE MEDICAL CARE IF:   You develop  chest pain or shortness of breath, feel faint, or pass out.  You have bleeding, swelling larger than a walnut, or drainage from the catheter insertion site.  You develop pain, discoloration, coldness, or severe bruising in the leg or arm that held the catheter.  You have heavy bleeding from the site. If this happens, hold pressure on the site and call 911. MAKE SURE YOU:  Understand these instructions.  Will watch your condition.  Will get help right away if you are not doing well or get worse. Document Released: 07/17/2004 Document Revised: 08/31/2012 Document Reviewed: 05/23/2012 [employer] [employer] Patient Information 2014 [employer].

## 2013-05-22 NOTE — Progress Notes (Signed)
Dr. Eldridge DaceVaranasi paged and obtained pain medication for c/o back pain. Pt repositioned for comfort with pillows for support.

## 2013-05-22 NOTE — Progress Notes (Signed)
Site area: right groin  Site Prior to Removal:  Level 0  Pressure Applied For 25 MINUTES    Minutes Beginning at 0840  Manual:   yes  Patient Status During Pull:  stable  Post Pull Groin Site:  Level 0  Post Pull Instructions Given:  yes  Post Pull Pulses Present:  yes  Dressing Applied:  yes  Comments:  Pt tolerated sheath pull well.

## 2013-05-22 NOTE — CV Procedure (Signed)
    PROCEDURE:  Left heart catheterization with selective coronary angiography, Bypass angiography, left ventriculogram.   INDICATIONS:  New onset heart failure  The risks, benefits, and details of the procedure were explained to the patient.  The patient verbalized understanding and wanted to proceed.  Informed written consent was obtained.  PROCEDURE TECHNIQUE:  After Xylocaine anesthesia a 62F sheath was placed in the right femoral artery with a single anterior needle wall stick.   Left coronary angiography was done using a Judkins L4 guide catheter.  Right coronary angiography was done using a Judkins R4 guide catheter.  SVG angiography was done using the JR 4 catheter. Left ventriculography was done using a pigtail catheter. Hemostasis was obtained using manual compression.   CONTRAST:  Total of 75 cc.  COMPLICATIONS:  None.    HEMODYNAMICS:  Aortic pressure was 132/72; LV pressure was 134/8; LVEDP 10.  There was no gradient between the left ventricle and aorta.    ANGIOGRAPHIC DATA:   The left main coronary artery is widely patent.  The left anterior descending artery is diffusely diseased proximally. The mid vessel is occluded. There is a large diagonal which originates before the occlusion. This is mildly diseased.  The SVG to LAD is widely patent with only mild disease. The mid to distal and apical LAD appear widely patent. There is retrograde filling of the more proximal LAD from the graft. There is disease and then filling into a medium-sized diagonal. Due to disease proximal to the insertion site, the medium size diagonal vessel, which is patent, is compromised.  The left circumflex artery is diffusely diseased.  The OM one is small but patent. There is an OM 2 which is medium-sized and patent.  The circumflex system fills by collaterals from the RCA graft.  The SVG to OM is occluded.  The right coronary artery is occluded proximally.  The SVG to PDA is widely patent. Just at  the insertion site, there is a 40-50% lesion. The RCA graft gives brisk collaterals to the circumflex system.  LEFT VENTRICULOGRAM:  Left ventricular angiogram was done in the 30 RAO projection and revealed normal left ventricular wall motion and systolic function with an estimated ejection fraction of 25 %.  LVEDP was 10 mmHg.  IMPRESSIONS:  1. Patent left main coronary artery. 2. Occluded mid left anterior descending artery. Patent SVG to LAD. 3. Diffusely diseased left circumflex artery and its branches.  SVG to OM is occluded. OM fills by brisk right-to-left collaterals. 4. Occluded native right coronary artery.  Widely patent SVG to RCA with moderate focal disease just before the insertion site. The RCA gives brisk collaterals to the OM. 5. Globally reduced left ventricular systolic function of severe degree.  LVEDP 10 mmHg.  Ejection fraction 25 %.  RECOMMENDATION:  Continue medical therapy. No clear target for revascularization. Coronary artery disease does not explain his decreased ejection fraction. He is well compensated. He will followup with Dr. Antoine PocheHochrein.

## 2013-05-23 ENCOUNTER — Telehealth: Payer: Self-pay | Admitting: Cardiology

## 2013-05-23 NOTE — Telephone Encounter (Signed)
New Message  Pt called states that he has had a cardiac cath that came back normal however he states that with his recent ECHO he received a negative result from Dr. Antoine PocheHochrein. Requests a call back to discuss further. Pt states that he is confused. Please call

## 2013-05-26 NOTE — Telephone Encounter (Signed)
Pt calling to discuss his decrease in heart pumping function.  Advised recent echo demonstrated a decrease in EF which led to the heart cath.  He reports that after his heart cath he was told to "take it easy" which he thought meant not to do anything.  He thought that meant his heart was so bad that he shouldn't do anything physical.  Advised pt he was told to "take it easy" so that the cath site in his groin could heal appropriately.  Advised he should resume his normal activities and not to stop doing things he typically would do.  Also advised him to continue medications as RXed and keep his follow up appointment as scheduled for 5/28.  He is aware his medications to help increase his heart pumping function will probably be changed and or increased but that will be determined once he see Dr Antoine PocheHochrein in follow up.  Pt reports feeling much better and having a better understanding after our conversation.  He will call back with further questions and/or concerns.

## 2013-05-29 ENCOUNTER — Encounter (HOSPITAL_COMMUNITY): Payer: Medicare Other

## 2013-05-31 ENCOUNTER — Encounter: Payer: Self-pay | Admitting: Cardiology

## 2013-05-31 ENCOUNTER — Ambulatory Visit (INDEPENDENT_AMBULATORY_CARE_PROVIDER_SITE_OTHER): Payer: Medicare Other | Admitting: Cardiology

## 2013-05-31 VITALS — BP 125/76 | HR 69 | Ht 70.0 in | Wt 189.6 lb

## 2013-05-31 DIAGNOSIS — I2510 Atherosclerotic heart disease of native coronary artery without angina pectoris: Secondary | ICD-10-CM

## 2013-05-31 DIAGNOSIS — I2589 Other forms of chronic ischemic heart disease: Secondary | ICD-10-CM

## 2013-05-31 DIAGNOSIS — I255 Ischemic cardiomyopathy: Secondary | ICD-10-CM

## 2013-05-31 MED ORDER — AMLODIPINE BESYLATE 5 MG PO TABS: 5.0000 mg | ORAL_TABLET | Freq: Every day | ORAL | Status: DC

## 2013-05-31 MED ORDER — CARVEDILOL 3.125 MG PO TABS: 3.1250 mg | ORAL_TABLET | Freq: Two times a day (BID) | ORAL | Status: DC

## 2013-05-31 NOTE — Patient Instructions (Signed)
Please decrease your Amlodipine to 5 mg a day. Start Carvedilol 3.125 mg one twice a day. Continue all other medications as listed.  Keep appointment as scheduled.

## 2013-05-31 NOTE — Progress Notes (Signed)
HPI The patient returns for followup of his known coronary disease and edema with dyspnea.  He saw me after he saw Dr. Yetta BarreJones as he had gained about 13 pounds. weight gain over several weeks and was having abdominal distention and increased lower extremity edema. He was started on diuretics and is back down 13 pounds.  He was diuresed.  He did have an echocardiogram which demonstrated that his EF was 25%.  This was down from an EF of 55% in 2014.  Cardiac cath demonstrated 3 vessel native CAD with patent SVT to the LAD , SVG to RCA patent and occluded SVG to OM.  He was continued on medical management.    Since he has his recent diuresis he has done well.  He does not have further SOB.  The patient denies any new symptoms such as chest discomfort, neck or arm discomfort. There has been no new shortness of breath, PND or orthopnea. There have been no reported palpitations, presyncope or syncope.  He has had no further edema.    No Known Allergies  Current Outpatient Prescriptions  Medication Sig Dispense Refill  . allopurinol (ZYLOPRIM) 100 MG tablet Take 1 tablet (100 mg total) by mouth daily.  90 tablet  3  . amLODipine (NORVASC) 10 MG tablet Take 10 mg by mouth daily. TAKE 1 TABLET DAILY      . atorvastatin (LIPITOR) 40 MG tablet Take 40 mg by mouth daily.      . colchicine 0.6 MG tablet Take 0.6 mg by mouth 2 (two) times daily.      . furosemide (LASIX) 40 MG tablet Take 1 tablet (40 mg total) by mouth daily.  60 tablet  5  . lisinopril (PRINIVIL,ZESTRIL) 20 MG tablet Take 1 tablet (20 mg total) by mouth daily.  90 tablet  2  . nitroGLYCERIN (NITROSTAT) 0.4 MG SL tablet Place 1 tablet (0.4 mg total) under the tongue every 5 (five) minutes as needed. For chest pain--may repeat three times.  25 tablet  8  . potassium chloride SA (K-DUR,KLOR-CON) 20 MEQ tablet Take 20 mEq by mouth 2 (two) times daily.      . rivaroxaban (XARELTO) 20 MG TABS tablet Take 1 tablet (20 mg total) by mouth daily with  supper.  30 tablet    . silodosin (RAPAFLO) 8 MG CAPS capsule Take 8 mg by mouth daily with breakfast.       No current facility-administered medications for this visit.    Past Medical History  Diagnosis Date  . ASCVD (arteriosclerotic cardiovascular disease)   . Decreased left ventricular function   . Ventricular hypokinesis     severe/basilar & mid-inferior and inferoseptal segments  . Scarring     cardio/inferior, inferolateral & inferoapical w/o ischemia  . Cerebrovascular disease 01/2004    right carotid bruit, minimal plaque on US   . Sick sinus syndrome 08/2004    Sinus Bradycardia & AFib  . Chronic anticoagulation   . History of tobacco abuse     20 pack years; d/c in 1983  . Other and unspecified hyperlipidemia   . Gout, unspecified   . Unspecified essential hypertension   . Vertigo   . Osteoarthritis   . Pacemaker 08/2004    dual-chamber Guidant  . CAD (coronary artery disease)   . HTN (hypertension)     Past Surgical History  Procedure Laterality Date  . Transurethral resection of prostate    . Anterior fusion cervical spine  2006  LS [2003]; prior cervical spine procedure w/implantation of hardware[[[  . Cholecystectomy    . Coronary artery bypass graft  1983    three times  . Appendectomy    . Knee surgery      Left, twice  . Pacemaker placement  08/2004    Guidant implant  . Neck surgery    . Back surgery    . Bladder surgery      ROS:  As stated in the HPI and negative for all other systems.  PHYSICAL EXAM BP 125/76  Pulse 69  Ht 5\' 10"  (1.778 m)  Wt 189 lb 9.6 oz (86.002 kg)  BMI 27.20 kg/m2 GENERAL:  Well appearing NECK:  No jugular venous distention, waveform within normal limits, carotid upstroke brisk and symmetric, no bruits, no thyromegaly LYMPHATICS:  No cervical, inguinal adenopathy LUNGS:  Clear to auscultation bilaterally CHEST:  Well healed sternotomy scar, pacemaker pocket HEART:  PMI not displaced or sustained,S1 and S2  within normal limits, no S3, no clicks, no rubs, no murmurs ABD:  Flat, positive bowel sounds normal in frequency in pitch, no bruits, no rebound, no guarding, no midline pulsatile mass, no hepatomegaly, no splenomegaly EXT:  2 plus pulses throughout, no edema, no cyanosis no clubbing SKIN:  Psoriatic rash,  no nodules   ASSESSMENT AND PLAN  ISCHEMIC CARDIOMYOPATHY - I will titrate his meds with adding a low-dose of carvedilol. I will be reducing Norvasc.  I will slowly titrate this. He's on a reasonable dose of ACE inhibitor. Once we have finished med titration we might need to consider changing for an ICD if his ejection fraction remains low. Currently he seems to have class II symptoms are worse  CAD -  Bypass grafts are as described. We will continue with risk reduction.  HYPERTENSION -  This is being managed in the context of treating his CHF  HYPERLIPIDEMIA -  He will continue on the meds as listed.   Atrial fibrillation -  The patient is tolerating his Xarelto.  No change in therapy is indicated.

## 2013-06-02 ENCOUNTER — Telehealth: Payer: Self-pay | Admitting: Cardiology

## 2013-06-02 NOTE — Telephone Encounter (Signed)
New message ° ° ° ° ° °Pt is returning a nurses call °

## 2013-06-02 NOTE — Telephone Encounter (Signed)
Pt calling because someone called him to reschedule his appt.  Pt advised he should not move his appt up since we just saw him Wednesday.  He will keep his appt as scheduled.

## 2013-06-08 ENCOUNTER — Encounter: Payer: Self-pay | Admitting: Cardiology

## 2013-06-08 ENCOUNTER — Ambulatory Visit (INDEPENDENT_AMBULATORY_CARE_PROVIDER_SITE_OTHER): Payer: Medicare Other | Admitting: Cardiology

## 2013-06-08 ENCOUNTER — Ambulatory Visit: Payer: Medicare Other | Admitting: Cardiology

## 2013-06-08 VITALS — BP 120/68 | HR 69 | Ht 70.0 in | Wt 187.8 lb

## 2013-06-08 DIAGNOSIS — I4891 Unspecified atrial fibrillation: Secondary | ICD-10-CM

## 2013-06-08 DIAGNOSIS — I2510 Atherosclerotic heart disease of native coronary artery without angina pectoris: Secondary | ICD-10-CM

## 2013-06-08 DIAGNOSIS — I509 Heart failure, unspecified: Secondary | ICD-10-CM

## 2013-06-08 MED ORDER — CARVEDILOL 6.25 MG PO TABS: 6.2500 mg | ORAL_TABLET | Freq: Two times a day (BID) | ORAL | Status: AC

## 2013-06-08 NOTE — Patient Instructions (Signed)
Please increase your carvedilol to 6.25 mg one twice a day. Stop Norvasc (Amlodipine). Continue all other medications as listed.  Follow up in one month at the [facility] office.

## 2013-06-08 NOTE — Progress Notes (Signed)
HPI The patient returns for followup of his known coronary disease and edema with dyspnea.  He saw me after he saw Dr. Yetta BarreJones as he had gained about 13 pounds. weight gain over several weeks and was having abdominal distention and increased lower extremity edema. He was started on diuretics and is back down 13 pounds.  He was diuresed.  He did have an echocardiogram which demonstrated that his EF was 25%.  This was down from an EF of 55% in 2014.  Cardiac cath demonstrated 3 vessel native CAD with patent SVT to the LAD , SVG to RCA patent and occluded SVG to OM.  He was continued on medical management.    At the last visit I reduced his Norvasc and low dose of beta blocker. He's been a little tired but he's not had any of her symptoms. He might have some slight dyspnea with activities. He denies any PND or orthopnea. He's not had any chest pressure, neck or arm discomfort. He's not had any palpitations and only mild lightheadedness. He's walking his dog routinely.  No Known Allergies  Current Outpatient Prescriptions  Medication Sig Dispense Refill  . allopurinol (ZYLOPRIM) 100 MG tablet Take 1 tablet (100 mg total) by mouth daily.  90 tablet  3  . amLODipine (NORVASC) 5 MG tablet Take 1 tablet (5 mg total) by mouth daily. TAKE 1 TABLET DAILY  30 tablet  11  . atorvastatin (LIPITOR) 40 MG tablet Take 40 mg by mouth daily.      . carvedilol (COREG) 3.125 MG tablet Take 1 tablet (3.125 mg total) by mouth 2 (two) times daily.  60 tablet  3  . colchicine 0.6 MG tablet Take 0.6 mg by mouth 2 (two) times daily.      . furosemide (LASIX) 40 MG tablet Take 1 tablet (40 mg total) by mouth daily.  60 tablet  5  . lisinopril (PRINIVIL,ZESTRIL) 20 MG tablet Take 1 tablet (20 mg total) by mouth daily.  90 tablet  2  . nitroGLYCERIN (NITROSTAT) 0.4 MG SL tablet Place 1 tablet (0.4 mg total) under the tongue every 5 (five) minutes as needed. For chest pain--may repeat three times.  25 tablet  8  . potassium  chloride SA (K-DUR,KLOR-CON) 20 MEQ tablet Take 20 mEq by mouth 2 (two) times daily.      . rivaroxaban (XARELTO) 20 MG TABS tablet Take 1 tablet (20 mg total) by mouth daily with supper.  30 tablet    . silodosin (RAPAFLO) 8 MG CAPS capsule Take 8 mg by mouth daily with breakfast.       No current facility-administered medications for this visit.    Past Medical History  Diagnosis Date  . ASCVD (arteriosclerotic cardiovascular disease)   . Decreased left ventricular function   . Ventricular hypokinesis     severe/basilar & mid-inferior and inferoseptal segments  . Scarring     cardio/inferior, inferolateral & inferoapical w/o ischemia  . Cerebrovascular disease 01/2004    right carotid bruit, minimal plaque on US   . Sick sinus syndrome 08/2004    Sinus Bradycardia & AFib  . Chronic anticoagulation   . History of tobacco abuse     20 pack years; d/c in 1983  . Other and unspecified hyperlipidemia   . Gout, unspecified   . Unspecified essential hypertension   . Vertigo   . Osteoarthritis   . Pacemaker 08/2004    dual-chamber Guidant  . CAD (coronary artery disease)   .  HTN (hypertension)     Past Surgical History  Procedure Laterality Date  . Transurethral resection of prostate    . Anterior fusion cervical spine  2006    LS [2003]; prior cervical spine procedure w/implantation of hardware[[[  . Cholecystectomy    . Coronary artery bypass graft  1983    three times  . Appendectomy    . Knee surgery      Left, twice  . Pacemaker placement  08/2004    Guidant implant  . Neck surgery    . Back surgery    . Bladder surgery      ROS:  As stated in the HPI and negative for all other systems.  PHYSICAL EXAM BP 120/68  Pulse 69  Ht 5\' 10"  (1.778 m)  Wt 187 lb 12.8 oz (85.186 kg)  BMI 26.95 kg/m2 GENERAL:  Well appearing NECK:  No jugular venous distention, waveform within normal limits, carotid upstroke brisk and symmetric, no bruits, no thyromegaly LYMPHATICS:   No cervical, inguinal adenopathy LUNGS:  Clear to auscultation bilaterally CHEST:  Well healed sternotomy scar, pacemaker pocket HEART:  PMI not displaced or sustained,S1 and S2 within normal limits, no S3, no clicks, no rubs, no murmurs ABD:  Flat, positive bowel sounds normal in frequency in pitch, no bruits, no rebound, no guarding, no midline pulsatile mass, no hepatomegaly, no splenomegaly EXT:  2 plus pulses throughout, no edema, no cyanosis no clubbing SKIN:  Psoriatic rash,  no nodules   ASSESSMENT AND PLAN  ISCHEMIC CARDIOMYOPATHY - Today I will stop the Norvasc and titrate his carvedilol to 6.25 mg twice daily. I will slowly titrate this. He's on a reasonable dose of ACE inhibitor. Once we have finished med titration we might need to consider an ICD if his ejection fraction remains low. Currently he seems to have class II symptoms are worse  CAD -  Bypass grafts are as described. We will continue with risk reduction.  HYPERTENSION -  This is being managed in the context of treating his CHF  HYPERLIPIDEMIA -  He will continue on the meds as listed.   Atrial fibrillation -  The patient is tolerating his Xarelto.  No change in therapy is indicated.

## 2013-06-12 ENCOUNTER — Encounter: Payer: Self-pay | Admitting: Internal Medicine

## 2013-06-12 ENCOUNTER — Encounter (INDEPENDENT_AMBULATORY_CARE_PROVIDER_SITE_OTHER): Payer: Medicare Other | Admitting: Ophthalmology

## 2013-06-12 DIAGNOSIS — H3560 Retinal hemorrhage, unspecified eye: Secondary | ICD-10-CM

## 2013-06-12 DIAGNOSIS — H35039 Hypertensive retinopathy, unspecified eye: Secondary | ICD-10-CM

## 2013-06-12 DIAGNOSIS — H43819 Vitreous degeneration, unspecified eye: Secondary | ICD-10-CM

## 2013-06-12 DIAGNOSIS — I10 Essential (primary) hypertension: Secondary | ICD-10-CM

## 2013-06-13 ENCOUNTER — Other Ambulatory Visit: Payer: Self-pay | Admitting: Internal Medicine

## 2013-06-30 ENCOUNTER — Telehealth: Payer: Self-pay | Admitting: Cardiology

## 2013-07-03 ENCOUNTER — Telehealth: Payer: Self-pay | Admitting: Cardiology

## 2013-07-03 NOTE — Telephone Encounter (Signed)
[facility] gave to Pam/Hochrein For Signature  6.22.15/km

## 2013-07-03 NOTE — Telephone Encounter (Signed)
Death certificate signed and taken to [facility] to be faxed.  They are aware.

## 2013-07-03 NOTE — Telephone Encounter (Signed)
Dr.Hochrein Signed Faxed Copy Of d/c, [facility] [phone] Original Will Be Mailed   6.22.15/km

## 2013-07-03 NOTE — Telephone Encounter (Signed)
New message     Want to know if Dr Antoine PocheHochrein is going to sign the death certificate.  They want to cremate the patient.  He died friday

## 2013-07-03 NOTE — Telephone Encounter (Signed)
Just received a faxed copy of the death certificate today.  It has been given to Dr Antoine PocheHochrein who will sign it today.

## 2013-07-06 ENCOUNTER — Ambulatory Visit: Payer: Medicare Other | Admitting: Cardiology

## 2013-07-07 ENCOUNTER — Telehealth: Payer: Self-pay | Admitting: Cardiology

## 2013-07-07 NOTE — Telephone Encounter (Signed)
Original Death Certificate rec, [facility] to [facility] on Monday 6.29.15 6.25.15/km

## 2013-07-12 NOTE — Telephone Encounter (Signed)
Javier CoastNicole Mills [facility] called asking If Dr.Hochrein Will sign D/C for Pt who Was in Home Deceased Spoke with  [name]/Dr.Hochrein Who agreed to Sign, Javier GreathouseCalled Nichol back @ [phone] made her aware. 6.19.15/km

## 2013-07-12 DEATH — deceased

## 2013-07-13 ENCOUNTER — Telehealth: Payer: Self-pay | Admitting: Cardiology

## 2013-07-13 NOTE — Telephone Encounter (Signed)
D/c Picked up

## 2013-07-13 NOTE — Telephone Encounter (Signed)
[facility] Ready For Pick up 7.2.15/km

## 2013-07-19 ENCOUNTER — Other Ambulatory Visit: Payer: Self-pay | Admitting: Cardiology

## 2013-08-04 IMAGING — CT CT ABD-PEL WO/W CM
2 of 9 series · 13 of 46 positions shown, 19 images · IV contrast (omnipaque)
Comparison: 10/21/2007

CLINICAL DATA: Microscopic hematuria.  Bladder pain.

CT ABDOMEN AND PELVIS WITHOUT AND WITH CONTRAST
TECHNIQUE: Multidetector CT imaging of the abdomen and pelvis was
performed without contrast material in one or both body regions,
followed by contrast material(s) and further sections in one or
both body regions.
Contrast: 125mL OMNIPAQUE IOHEXOL 300 MG/ML  SOLN

[Series 2: hematuria without >45 · axial · non-contrast · 0.74mm/px · z∈[-522,-107]mm · 11 of 97 slices shown, 16 images]
[im 7/97  soft-tissue]
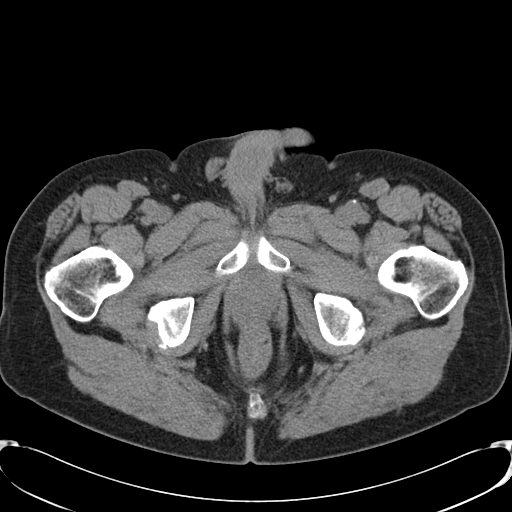
[im 7/97  bone]
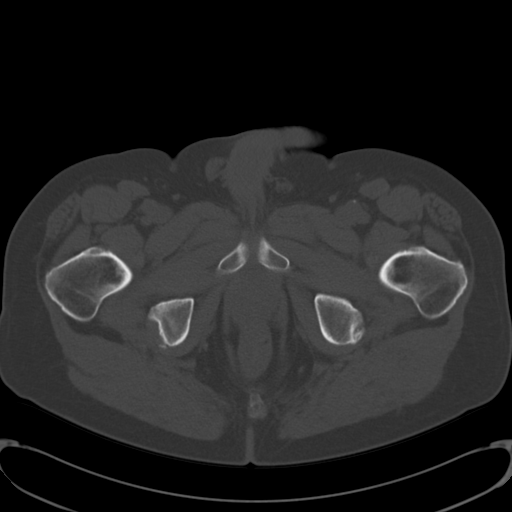
[im 20/97  soft-tissue]
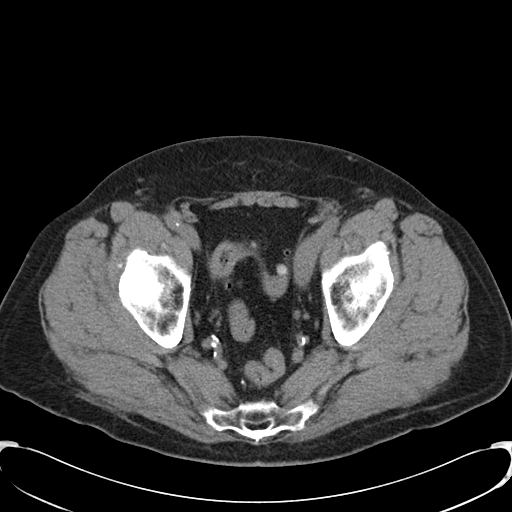
[im 26/97  soft-tissue]
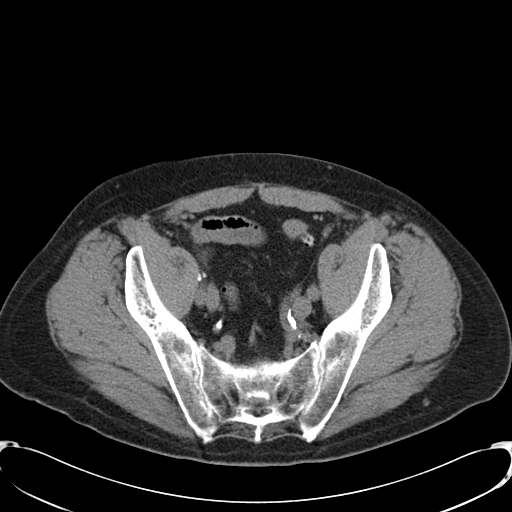
[im 33/97  soft-tissue]
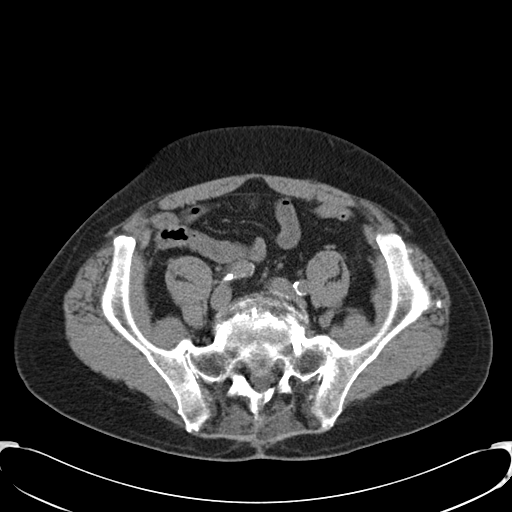
[im 45/97  soft-tissue]
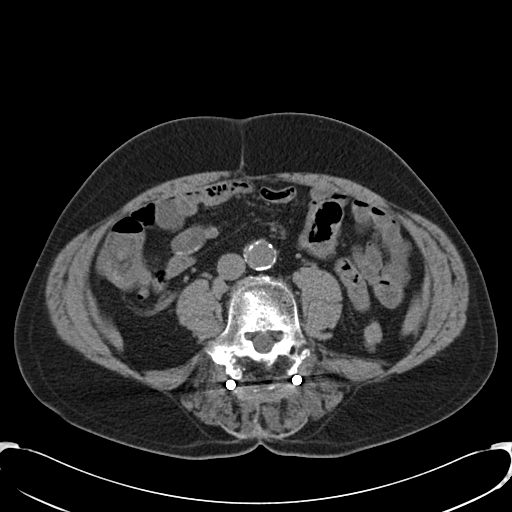
[im 52/97  soft-tissue]
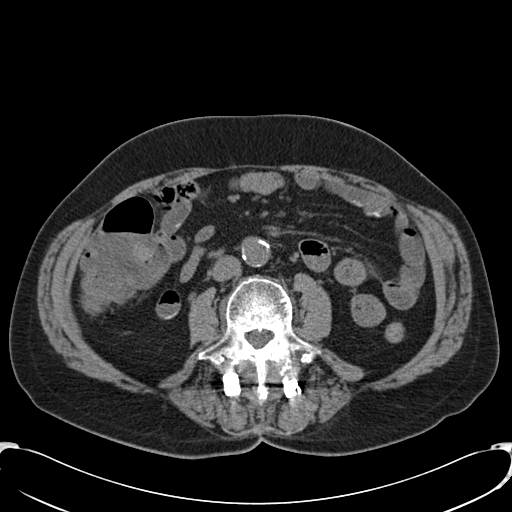
[im 65/97  soft-tissue]
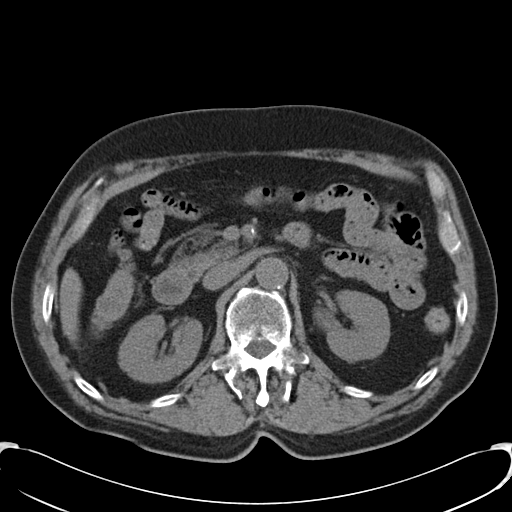
[im 71/97  soft-tissue]
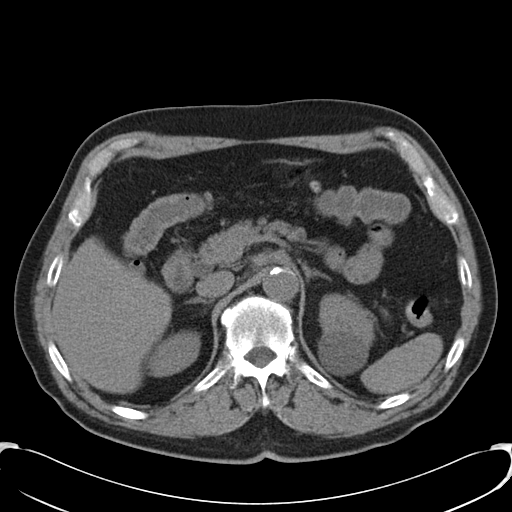
[im 71/97  lung]
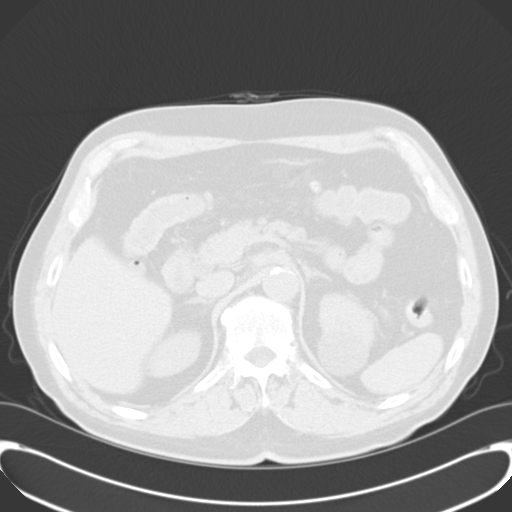
[im 77/97  soft-tissue]
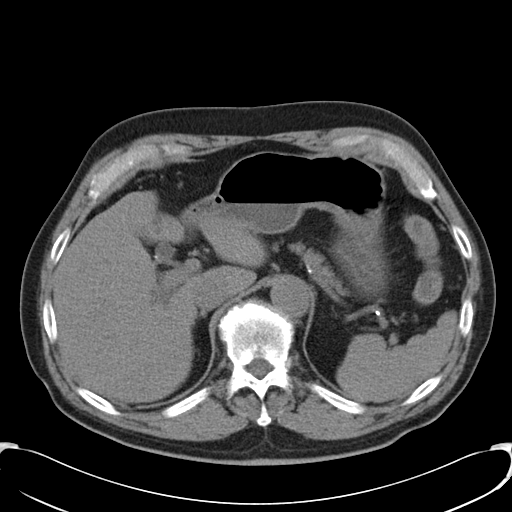
[im 77/97  lung]
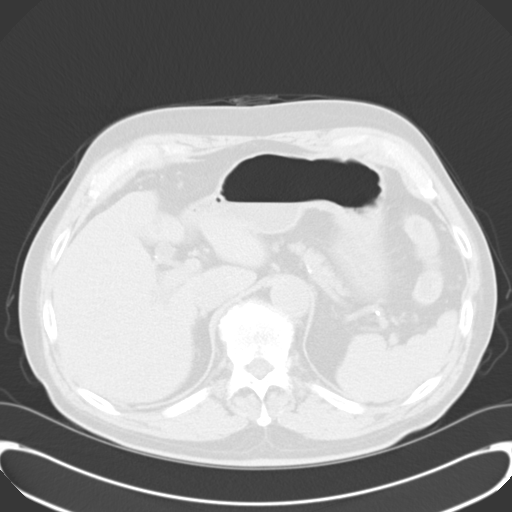
[im 77/97  bone]
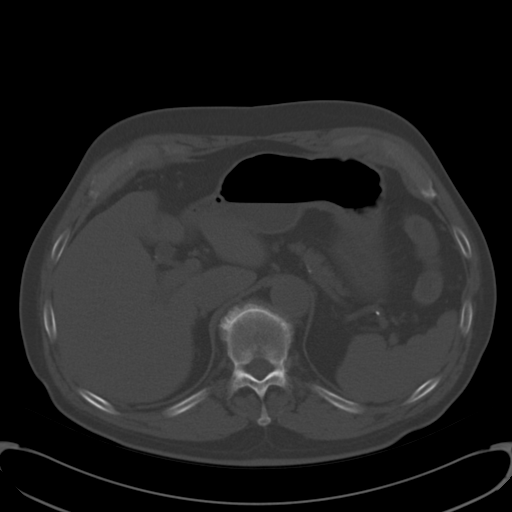
[im 84/97  lung]
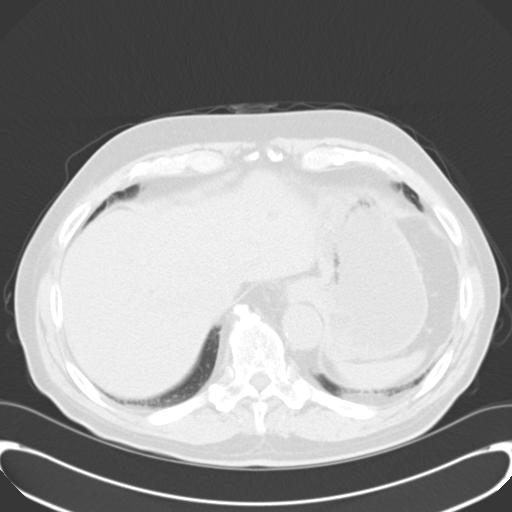
[im 90/97  soft-tissue]
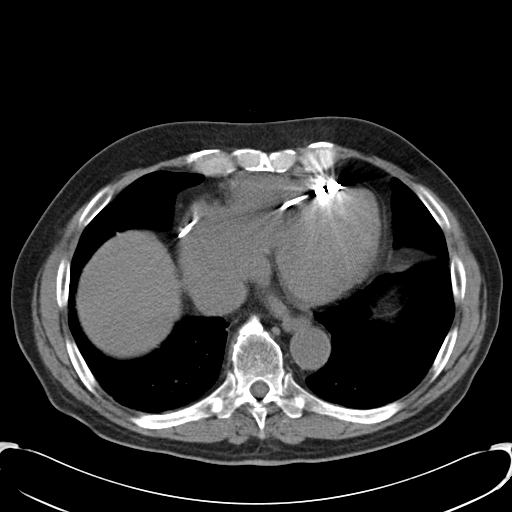
[im 90/97  lung]
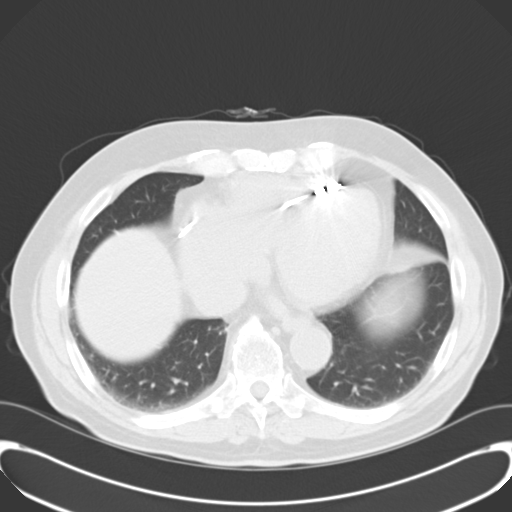

[Series 603: cor · coronal · 0.98mm/px · 2 of 112 slices shown, 3 images]
[im 38/112  soft-tissue]
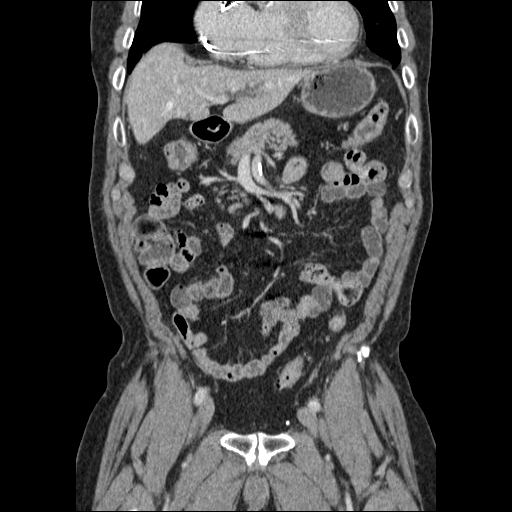
[im 38/112  bone]
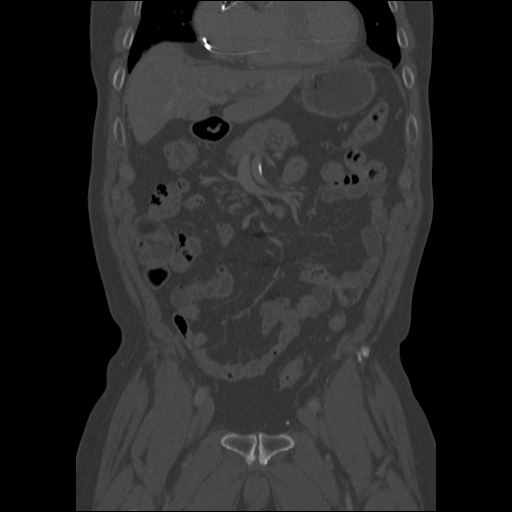
[im 75/112  soft-tissue]
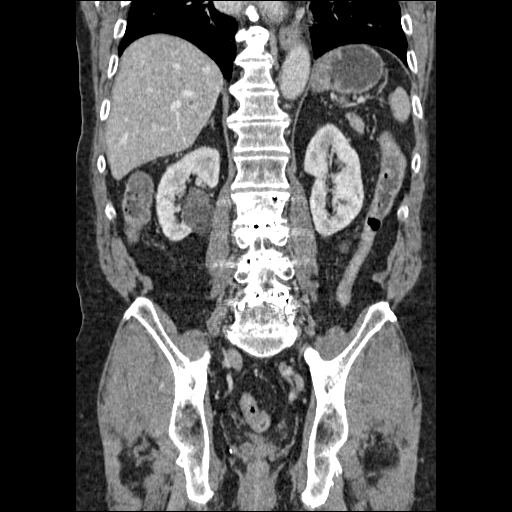

[13 of 46 positions shown; findings below may reference images not displayed]

FINDINGS: No evidence for bladder, ureteral, or kidney stones.

No abdominal aortic aneurysm.  No free fluid or lymphadenopathy in
the abdomen.

Imaging after IV contrast administration shows a 9 mm low density
lesion in the lateral segment left liver, unchanged.  6 mm low
density lesion in the central right liver is new in the interval,
and likely represents a tiny cyst.  There is mild intrahepatic
biliary duct dilatation stable to slightly more prominent in the
interval.  Extrahepatic common duct measures 10 mm in diameter,
decreased from 13 mm previously.

The spleen, stomach, duodenum, pancreas, and adrenal glands are
unremarkable.

2.8 cm water density lesion in the lower pole of the right kidney
was 2.5 cm previously and remains compatible with a cyst.  3.7 cm
water density lesion in the upper pole of the left kidney was
cm previously, compatible with a cyst.  Tiny cortical cyst noted at
the lower pole of the left kidney.

Imaging through the pelvis shows no free intraperitoneal fluid.  No
pelvic sidewall lymphadenopathy.  Scattered diverticular disease is
seen in the left colon without diverticulitis.  The terminal ileum
is normal. Nonvisualization of the appendix is consistent with the
reported history of appendectomy.

Bone windows reveal no worrisome lytic or sclerotic osseous
lesions.  The patient is status post multilevel lumbar fusion.]
IMPRESSION: No acute findings in the abdomen or pelvis.  No evidence to explain
the patient's history of hematuria.

## 2013-10-24 ENCOUNTER — Ambulatory Visit: Payer: Medicare Other | Admitting: Diagnostic Neuroimaging

## 2013-12-21 ENCOUNTER — Encounter (HOSPITAL_COMMUNITY): Payer: Self-pay | Admitting: Interventional Cardiology
